# Patient Record
Sex: Female | Born: 1950
Health system: Southern US, Community
[De-identification: ages and names within clinical notes are randomized; demographics above are authoritative.]

## PROBLEM LIST (undated history)

## (undated) DIAGNOSIS — E119 Type 2 diabetes mellitus without complications: Secondary | ICD-10-CM

## (undated) DIAGNOSIS — M1990 Unspecified osteoarthritis, unspecified site: Secondary | ICD-10-CM

## (undated) DIAGNOSIS — T7840XA Allergy, unspecified, initial encounter: Secondary | ICD-10-CM

## (undated) DIAGNOSIS — J45909 Unspecified asthma, uncomplicated: Secondary | ICD-10-CM

## (undated) DIAGNOSIS — E785 Hyperlipidemia, unspecified: Secondary | ICD-10-CM

## (undated) DIAGNOSIS — I10 Essential (primary) hypertension: Secondary | ICD-10-CM

## (undated) DIAGNOSIS — Z9289 Personal history of other medical treatment: Secondary | ICD-10-CM

## (undated) HISTORY — DX: Unspecified asthma, uncomplicated: J45.909

## (undated) HISTORY — DX: Allergy, unspecified, initial encounter: T78.40XA

## (undated) HISTORY — DX: Unspecified osteoarthritis, unspecified site: M19.90

## (undated) HISTORY — DX: Personal history of other medical treatment: Z92.89

## (undated) HISTORY — DX: Type 2 diabetes mellitus without complications: E11.9

## (undated) HISTORY — DX: Essential (primary) hypertension: I10

## (undated) HISTORY — DX: Hyperlipidemia, unspecified: E78.5

## (undated) HISTORY — PX: COLONOSCOPY: SHX174

## (undated) HISTORY — PX: FRACTURE SURGERY: SHX138

## (undated) HISTORY — PX: EYE SURGERY: SHX253

## (undated) HISTORY — PX: BREAST BIOPSY: SHX20

---

## 2010-05-03 ENCOUNTER — Ambulatory Visit (HOSPITAL_COMMUNITY)
Admission: RE | Admit: 2010-05-03 | Discharge: 2010-05-03 | Payer: Self-pay | Source: Home / Self Care | Attending: Family Medicine | Admitting: Family Medicine

## 2011-04-18 ENCOUNTER — Other Ambulatory Visit: Payer: Self-pay | Admitting: Family Medicine

## 2011-04-18 DIAGNOSIS — Z139 Encounter for screening, unspecified: Secondary | ICD-10-CM

## 2011-04-19 HISTORY — PX: OTHER SURGICAL HISTORY: SHX169

## 2011-05-05 ENCOUNTER — Ambulatory Visit (HOSPITAL_COMMUNITY)
Admission: RE | Admit: 2011-05-05 | Discharge: 2011-05-05 | Disposition: A | Discharge status: Home / Self Care | Payer: No Typology Code available for payment source | Source: Ambulatory Visit | Attending: Family Medicine | Admitting: Family Medicine

## 2011-05-05 DIAGNOSIS — Z1231 Encounter for screening mammogram for malignant neoplasm of breast: Secondary | ICD-10-CM | POA: Insufficient documentation

## 2011-05-05 DIAGNOSIS — Z139 Encounter for screening, unspecified: Secondary | ICD-10-CM

## 2011-07-04 ENCOUNTER — Other Ambulatory Visit (HOSPITAL_COMMUNITY)
Admission: RE | Admit: 2011-07-04 | Discharge: 2011-07-04 | Disposition: A | Discharge status: Home / Self Care | Payer: No Typology Code available for payment source | Source: Ambulatory Visit | Attending: Orthopaedic Surgery | Admitting: Orthopaedic Surgery

## 2011-07-04 DIAGNOSIS — M6530 Trigger finger, unspecified finger: Secondary | ICD-10-CM | POA: Insufficient documentation

## 2012-03-26 ENCOUNTER — Other Ambulatory Visit: Payer: Self-pay | Admitting: Family Medicine

## 2012-03-26 DIAGNOSIS — Z139 Encounter for screening, unspecified: Secondary | ICD-10-CM

## 2012-05-08 ENCOUNTER — Ambulatory Visit (HOSPITAL_COMMUNITY)
Admission: RE | Admit: 2012-05-08 | Discharge: 2012-05-08 | Disposition: A | Discharge status: Home / Self Care | Payer: No Typology Code available for payment source | Source: Ambulatory Visit | Attending: Family Medicine | Admitting: Family Medicine

## 2012-05-08 DIAGNOSIS — Z139 Encounter for screening, unspecified: Secondary | ICD-10-CM

## 2012-05-08 DIAGNOSIS — Z1231 Encounter for screening mammogram for malignant neoplasm of breast: Secondary | ICD-10-CM | POA: Insufficient documentation

## 2012-08-22 ENCOUNTER — Ambulatory Visit (INDEPENDENT_AMBULATORY_CARE_PROVIDER_SITE_OTHER): Payer: No Typology Code available for payment source | Admitting: Family Medicine

## 2012-08-22 ENCOUNTER — Encounter: Payer: Self-pay | Admitting: Family Medicine

## 2012-08-22 VITALS — BP 130/80 | HR 60 | Ht 62.0 in | Wt 145.1 lb

## 2012-08-22 DIAGNOSIS — E785 Hyperlipidemia, unspecified: Secondary | ICD-10-CM

## 2012-08-22 DIAGNOSIS — E119 Type 2 diabetes mellitus without complications: Secondary | ICD-10-CM

## 2012-08-22 DIAGNOSIS — I10 Essential (primary) hypertension: Secondary | ICD-10-CM

## 2012-08-22 NOTE — Patient Instructions (Signed)
Call one wk before wellness exam for proper bloodwork

## 2012-08-22 NOTE — Progress Notes (Signed)
  Subjective:    Patient ID: Bianca Arroyo, female    DOB: 06-16-1950, 62 y.o.   MRN: [id number]  Diabetes She presents for her follow-up diabetic visit. She has type 2 diabetes mellitus. Her disease course has been fluctuating. There are no hypoglycemic associated symptoms. Pertinent negatives for diabetes include no blurred vision, no chest pain, no foot ulcerations, no polydipsia and no polyphagia. There are no hypoglycemic complications. Symptoms are stable. There are no diabetic complications. Risk factors for coronary artery disease include dyslipidemia and hypertension. Current diabetic treatment includes oral agent (monotherapy). She is compliant with treatment most of the time. Her weight is increasing steadily. Meal planning includes avoidance of concentrated sweets. She participates in exercise daily. Her home blood glucose trend is fluctuating minimally. Her breakfast blood glucose is taken between 8-9 am. Her breakfast blood glucose range is generally 110-130 mg/dl.    Trying to stay on all meds. dowes nt add sugar to anything.  Review of Systems  Eyes: Negative for blurred vision.  Cardiovascular: Negative for chest pain.  Endocrine: Negative for polydipsia and polyphagia.       Results for orders placed in visit on 08/22/12  POCT GLYCOSYLATED HEMOGLOBIN (HGB A1C)      Result Value Range   Hemoglobin A1C 6.0      Objective:   Physical Exam  Alert no acute distress. HEENT normal. Vitals reviewed. Lungs clear. Heart regular rate and rhythm. Feet pulses good. Sensation intact no edema     Assessment & Plan:  Impression #1 type 2 diabetes good control discussed. #2 hypertension good control discussed. A1c 6.0. Plan diet exercise discussed maintain same meds. Recheck in several months for wellness exam. Encouraged to one week before then to call for blood work. WSL

## 2012-08-23 ENCOUNTER — Encounter: Payer: Self-pay | Admitting: *Deleted

## 2012-08-23 DIAGNOSIS — I10 Essential (primary) hypertension: Secondary | ICD-10-CM | POA: Insufficient documentation

## 2012-08-23 DIAGNOSIS — E785 Hyperlipidemia, unspecified: Secondary | ICD-10-CM | POA: Insufficient documentation

## 2012-08-23 DIAGNOSIS — E119 Type 2 diabetes mellitus without complications: Secondary | ICD-10-CM | POA: Insufficient documentation

## 2012-08-28 ENCOUNTER — Other Ambulatory Visit: Payer: Self-pay | Admitting: Family Medicine

## 2012-09-16 ENCOUNTER — Other Ambulatory Visit: Payer: Self-pay | Admitting: Family Medicine

## 2012-11-20 ENCOUNTER — Other Ambulatory Visit: Payer: Self-pay | Admitting: *Deleted

## 2012-11-20 MED ORDER — VALACYCLOVIR HCL 500 MG PO TABS: 500.0000 mg | ORAL_TABLET | Freq: Two times a day (BID) | ORAL | Status: DC

## 2012-11-26 ENCOUNTER — Other Ambulatory Visit: Payer: Self-pay

## 2012-11-26 MED ORDER — VALACYCLOVIR HCL 500 MG PO TABS: 500.0000 mg | ORAL_TABLET | Freq: Two times a day (BID) | ORAL | Status: DC

## 2012-11-28 ENCOUNTER — Other Ambulatory Visit: Payer: Self-pay | Admitting: Family Medicine

## 2012-12-05 ENCOUNTER — Other Ambulatory Visit: Payer: Self-pay | Admitting: Family Medicine

## 2012-12-05 ENCOUNTER — Encounter: Payer: Self-pay | Admitting: Family Medicine

## 2012-12-05 ENCOUNTER — Ambulatory Visit (INDEPENDENT_AMBULATORY_CARE_PROVIDER_SITE_OTHER): Payer: No Typology Code available for payment source | Admitting: Family Medicine

## 2012-12-05 VITALS — BP 132/78 | HR 70 | Ht 61.5 in | Wt 148.0 lb

## 2012-12-05 DIAGNOSIS — I10 Essential (primary) hypertension: Secondary | ICD-10-CM

## 2012-12-05 DIAGNOSIS — Z0000 Encounter for general adult medical examination without abnormal findings: Secondary | ICD-10-CM

## 2012-12-05 DIAGNOSIS — Z23 Encounter for immunization: Secondary | ICD-10-CM

## 2012-12-05 DIAGNOSIS — Z79899 Other long term (current) drug therapy: Secondary | ICD-10-CM

## 2012-12-05 DIAGNOSIS — Z124 Encounter for screening for malignant neoplasm of cervix: Secondary | ICD-10-CM

## 2012-12-05 DIAGNOSIS — E119 Type 2 diabetes mellitus without complications: Secondary | ICD-10-CM

## 2012-12-05 DIAGNOSIS — E785 Hyperlipidemia, unspecified: Secondary | ICD-10-CM

## 2012-12-05 MED ORDER — OFLOXACIN 0.3 % OP SOLN: OPHTHALMIC | Status: DC

## 2012-12-05 NOTE — Progress Notes (Signed)
  Subjective:    Patient ID: Bianca Arroyo, female    DOB: 1950-11-15, 62 y.o.   MRN: [id number]  HPIHere for a wellness exam. She has been having right ear pain x 2.5 months.  Results for orders placed in visit on 08/22/12  POCT GLYCOSYLATED HEMOGLOBIN (HGB A1C)      Result Value Range   Hemoglobin A1C 6.0     Pt exercising off and on, dancing twice per wk, walk occasionally,   Next colon due 2020  Claims compliance with medication.  Admits to not watching her diet the best.   Review of Systems  Constitutional: Negative for activity change, appetite change and fatigue.  HENT: Negative for congestion, rhinorrhea, neck pain and ear discharge.   Eyes: Negative for discharge.  Respiratory: Negative for cough, chest tightness and wheezing.   Cardiovascular: Negative for chest pain.  Gastrointestinal: Negative for vomiting and abdominal pain.  Genitourinary: Negative for frequency and difficulty urinating.  Allergic/Immunologic: Negative for environmental allergies and food allergies.  Neurological: Negative for weakness and headaches.  Psychiatric/Behavioral: Negative for behavioral problems and agitation.       Objective:   Physical Exam  Constitutional: She is oriented to person, place, and time. She appears well-developed and well-nourished.  HENT:  Head: Normocephalic.  Right Ear: External ear normal.  Left Ear: External ear normal.  Eyes: Pupils are equal, round, and reactive to light.  Neck: Normal range of motion. No thyromegaly present.  Cardiovascular: Normal rate, regular rhythm, normal heart sounds and intact distal pulses.   No murmur heard. Pulmonary/Chest: Effort normal and breath sounds normal. No respiratory distress. She has no wheezes.  Abdominal: Soft. Bowel sounds are normal. She exhibits no distension and no mass. There is no tenderness.  Genitourinary: Vagina normal and uterus normal.  Pap smear obtained  Musculoskeletal: Normal range of motion.  She exhibits no edema and no tenderness.  Lymphadenopathy:    She has no cervical adenopathy.  Neurological: She is alert and oriented to person, place, and time. She exhibits normal muscle tone.  Skin: Skin is warm and dry.  Psychiatric: She has a normal mood and affect. Her behavior is normal.          Assessment & Plan:   impression #1 wellness exam. #2 hyperlipidemia #3 type 2 diabetes #4 hypertension plan pneumonia shot today. Hemoccult cards discussed. Appropriate blood work. Recheck as scheduled. WSL

## 2012-12-06 LAB — PAP IG W/ RFLX HPV ASCU

## 2012-12-07 ENCOUNTER — Other Ambulatory Visit: Payer: Self-pay | Admitting: Family Medicine

## 2012-12-07 LAB — HEPATIC FUNCTION PANEL
ALT: 16 U/L (ref 0–35)
AST: 20 U/L (ref 0–37)
Albumin: 4.2 g/dL (ref 3.5–5.2)

## 2012-12-07 LAB — LIPID PANEL
Cholesterol: 148 mg/dL (ref 0–200)
HDL: 74 mg/dL (ref >39)
Total CHOL/HDL Ratio: 2 Ratio
Triglycerides: 53 mg/dL (ref <150)

## 2012-12-11 ENCOUNTER — Ambulatory Visit (INDEPENDENT_AMBULATORY_CARE_PROVIDER_SITE_OTHER): Payer: No Typology Code available for payment source | Admitting: *Deleted

## 2012-12-11 DIAGNOSIS — H6123 Impacted cerumen, bilateral: Secondary | ICD-10-CM

## 2012-12-11 DIAGNOSIS — H6120 Impacted cerumen, unspecified ear: Secondary | ICD-10-CM

## 2012-12-11 NOTE — Progress Notes (Signed)
Bilateral ear irrigation without difficulty. 

## 2012-12-13 ENCOUNTER — Encounter: Payer: Self-pay | Admitting: Family Medicine

## 2013-02-12 ENCOUNTER — Ambulatory Visit (INDEPENDENT_AMBULATORY_CARE_PROVIDER_SITE_OTHER): Payer: No Typology Code available for payment source | Admitting: *Deleted

## 2013-02-12 DIAGNOSIS — Z23 Encounter for immunization: Secondary | ICD-10-CM

## 2013-02-20 ENCOUNTER — Telehealth: Payer: Self-pay | Admitting: Family Medicine

## 2013-02-20 NOTE — Telephone Encounter (Signed)
Patient has never been diagnosed or treated with gout. Advised patient to schedule office visit to determine diagnose. Transferred to front desk to schedule appointment.

## 2013-02-20 NOTE — Telephone Encounter (Signed)
Patient states her left foot has been hurting for about 3 weeks, however has been worse since Sunday February 17, 2013.  States it is at her big toe the worse.  She believes it is Gout, Wants to know if there is something can phoned in to [facility] in [place].  Please call Patient. Thanks

## 2013-02-25 ENCOUNTER — Ambulatory Visit (INDEPENDENT_AMBULATORY_CARE_PROVIDER_SITE_OTHER): Payer: No Typology Code available for payment source | Admitting: Family Medicine

## 2013-02-25 ENCOUNTER — Encounter: Payer: Self-pay | Admitting: Family Medicine

## 2013-02-25 VITALS — BP 130/80 | Ht 62.0 in | Wt 150.4 lb

## 2013-02-25 DIAGNOSIS — M109 Gout, unspecified: Secondary | ICD-10-CM

## 2013-02-25 MED ORDER — PREDNISONE (PAK) 10 MG PO TABS: ORAL_TABLET | ORAL | Status: DC

## 2013-02-25 NOTE — Progress Notes (Signed)
  Subjective:    Patient ID: Bianca Arroyo, female    DOB: [birth year], 61 y.o.   MRN: [id number]  HPI Patient is here today for a follow up visit on gout. Was seen at urgent care on Wednesday and was diagnosed with gout. Lab work was completed and patient has results with her today. Was told to follow up with her PCP for further workup.   Had an attack of podagra. Pain was severe. They rec blood work. dxed with gout.  Was given colcrys which caused diarrhea and abdominal cramping. Patient notes her foot feels somewhat better with the Relafen. Less pain than before.  There is family history of gallops with a sibling.  Patient also has been on a somewhat high dose of hydrochlorothiazide to 25 mg tablets daily.  Review of Systems No chest pain no back pain no headache no shortness breath no abdominal pain ROS otherwise negative    Objective:   Physical Exam Alert blood pressure good. HEENT normal. Lungs clear. Heart rare rhythm. Left great toe first metatarsal junction significant inflammation redness warmth swelling tenderness       Assessment & Plan:  #1 Blood work revealed a positive ANA. Patient has no history of lupus. No other symptoms. I believe this is a false-positive discussed. #2 gout in the midst of flare somewhat improved #3 hypertension stable. #4 diabetes currently stable. Plan 25 minutes spent most in discussion. Prednisone taper. Followup for diabetes physical. Warning signs discussed.

## 2013-02-25 NOTE — Patient Instructions (Signed)
Decrease the hctz to just one per dayGout Gout is an inflammatory arthritis caused by a buildup of uric acid crystals in the joints. Uric acid is a chemical that is normally present in the blood. When the level of uric acid in the blood is too high it can form crystals that deposit in your joints and tissues. This causes joint redness, soreness, and swelling (inflammation). Repeat attacks are common. Over time, uric acid crystals can form into masses (tophi) near a joint, destroying bone and causing disfigurement. Gout is treatable and often preventable. CAUSES  The disease begins with elevated levels of uric acid in the blood. Uric acid is produced by your body when it breaks down a naturally found substance called purines. Certain foods you eat, such as meats and fish, contain high amounts of purines. Causes of an elevated uric acid level include:  Being passed down from parent to child (heredity).  Diseases that cause increased uric acid production (such as obesity, psoriasis, and certain cancers).  Excessive alcohol use.  Diet, especially diets rich in meat and seafood.  Medicines, including certain cancer-fighting medicines (chemotherapy), water pills (diuretics), and aspirin.  Chronic kidney disease. The kidneys are no longer able to remove uric acid well.  Problems with metabolism. Conditions strongly associated with gout include:  Obesity.  High blood pressure.  High cholesterol.  Diabetes. Not everyone with elevated uric acid levels gets gout. It is not understood why some people get gout and others do not. Surgery, joint injury, and eating too much of certain foods are some of the factors that can lead to gout attacks. SYMPTOMS   An attack of gout comes on quickly. It causes intense pain with redness, swelling, and warmth in a joint.  Fever can occur.  Often, only one joint is involved. Certain joints are more commonly involved:  Base of the big  toe.  Knee.  Ankle.  Wrist.  Finger. Without treatment, an attack usually goes away in a few days to weeks. Between attacks, you usually will not have symptoms, which is different from many other forms of arthritis. DIAGNOSIS  Your caregiver will suspect gout based on your symptoms and exam. In some cases, tests may be recommended. The tests may include:  Blood tests.  Urine tests.  X-rays.  Joint fluid exam. This exam requires a needle to remove fluid from the joint (arthrocentesis). Using a microscope, gout is confirmed when uric acid crystals are seen in the joint fluid. TREATMENT  There are two phases to gout treatment: treating the sudden onset (acute) attack and preventing attacks (prophylaxis).  Treatment of an Acute Attack.  Medicines are used. These include anti-inflammatory medicines or steroid medicines.  An injection of steroid medicine into the affected joint is sometimes necessary.  The painful joint is rested. Movement can worsen the arthritis.  You may use warm or cold treatments on painful joints, depending which works best for you.  Treatment to Prevent Attacks.  If you suffer from frequent gout attacks, your caregiver may advise preventive medicine. These medicines are started after the acute attack subsides. These medicines either help your kidneys eliminate uric acid from your body or decrease your uric acid production. You may need to stay on these medicines for a very long time.  The early phase of treatment with preventive medicine can be associated with an increase in acute gout attacks. For this reason, during the first few months of treatment, your caregiver may also advise you to take medicines usually used  for acute gout treatment. Be sure you understand your caregiver's directions. Your caregiver may make several adjustments to your medicine dose before these medicines are effective.  Discuss dietary treatment with your caregiver or dietitian.  Alcohol and drinks high in sugar and fructose and foods such as meat, poultry, and seafood can increase uric acid levels. Your caregiver or dietician can advise you on drinks and foods that should be limited. HOME CARE INSTRUCTIONS   Do not take aspirin to relieve pain. This raises uric acid levels.  Only take over-the-counter or prescription medicines for pain, discomfort, or fever as directed by your caregiver.  Rest the joint as much as possible. When in bed, keep sheets and blankets off painful areas.  Keep the affected joint raised (elevated).  Apply warm or cold treatments to painful joints. Use of warm or cold treatments depends on which works best for you.  Use crutches if the painful joint is in your leg.  Drink enough fluids to keep your urine clear or pale yellow. This helps your body get rid of uric acid. Limit alcohol, sugary drinks, and fructose drinks.  Follow your dietary instructions. Pay careful attention to the amount of protein you eat. Your daily diet should emphasize fruits, vegetables, whole grains, and fat-free or low-fat milk products. Discuss the use of coffee, vitamin C, and cherries with your caregiver or dietician. These may be helpful in lowering uric acid levels.  Maintain a healthy body weight. SEEK MEDICAL CARE IF:   You develop diarrhea, vomiting, or any side effects from medicines.  You do not feel better in 24 hours, or you are getting worse. SEEK IMMEDIATE MEDICAL CARE IF:   Your joint becomes suddenly more tender, and you have chills or a fever. MAKE SURE YOU:   Understand these instructions.  Will watch your condition.  Will get help right away if you are not doing well or get worse. Document Released: 04/01/2000 Document Revised: 07/30/2012 Document Reviewed: 11/16/2011 [facility] Patient Information 2014 [place], [place].

## 2013-03-27 ENCOUNTER — Ambulatory Visit: Payer: No Typology Code available for payment source | Admitting: Family Medicine

## 2013-03-27 ENCOUNTER — Encounter: Payer: Self-pay | Admitting: Family Medicine

## 2013-03-27 ENCOUNTER — Ambulatory Visit (INDEPENDENT_AMBULATORY_CARE_PROVIDER_SITE_OTHER): Payer: No Typology Code available for payment source | Admitting: Family Medicine

## 2013-03-27 VITALS — BP 150/82 | Ht 62.0 in | Wt 148.5 lb

## 2013-03-27 DIAGNOSIS — E119 Type 2 diabetes mellitus without complications: Secondary | ICD-10-CM

## 2013-03-27 DIAGNOSIS — I10 Essential (primary) hypertension: Secondary | ICD-10-CM

## 2013-03-27 DIAGNOSIS — M109 Gout, unspecified: Secondary | ICD-10-CM

## 2013-03-27 LAB — POCT GLYCOSYLATED HEMOGLOBIN (HGB A1C): Hemoglobin A1C: 6.1

## 2013-03-27 MED ORDER — FEBUXOSTAT 40 MG PO TABS: 40.0000 mg | ORAL_TABLET | Freq: Every day | ORAL | Status: DC

## 2013-03-27 NOTE — Progress Notes (Signed)
   Subjective:    Patient ID: Bianca Arroyo, female    DOB: 07/17/1950, 62 y.o.   MRN: [id number]  Diabetes She presents for her follow-up diabetic visit. She has type 2 diabetes mellitus. Her disease course has been stable. There are no hypoglycemic associated symptoms. There are no diabetic associated symptoms. There are no hypoglycemic complications. Symptoms are stable. There are no diabetic complications. There are no known risk factors for coronary artery disease. Current diabetic treatment includes oral agent (monotherapy). She is compliant with treatment all of the time.    Patient is also here to follow up on gout. Notes that her gout has flared up once again. Unfortunately was almost gone. Now bothering her again. Left great toe. No other concerns at this time.   Compliant with blood pressure medicine. Blood pressure good when checked elsewhere. Tried watch salt intake. Was exercising regularly until she developed the gallops.   Glucoses generally pretty good, overall 120 ish. After eating goes up  BP usually good, generally  Results for orders placed in visit on 03/27/13  POCT GLYCOSYLATED HEMOGLOBIN (HGB A1C)      Result Value Range   Hemoglobin A1C 6.1       Review of Systems No chest pain no headache no back pain no shortness breath no abdominal pain ROS otherwise negative    Objective:   Physical Exam  Word H&T normal. Lungs clear heart regular in rhythm. Abdomen benign. Left great toe inflamed swollen tender.  See diabetic foot exam      Assessment & Plan:  Impression #1 persistent gout discussed at length. Start time to start on preventive agent discussed. #2 hypertension good control generally. #3 type 2 diabetes very good control. Plan diet exercise discussed. Add ULORIC1 tablet daily. Diet discussed. Maintain same medications.

## 2013-03-27 NOTE — Patient Instructions (Signed)
Would prefer to hold off on steroids if possible,  So, either take the generic relafen twice per day, or otc ibuprofen three tabs three times per day (600 mg total)

## 2013-03-29 ENCOUNTER — Telehealth: Payer: Self-pay | Admitting: Family Medicine

## 2013-03-29 NOTE — Telephone Encounter (Signed)
Pt seen Dr. Brett Canales Wednesday, was given Uloric for gout, is too expensive, is there a cheaper generic that can help?? Please advise [facility]

## 2013-03-29 NOTE — Telephone Encounter (Signed)
Dr Brett Canales will need to handle on Monday, notify pt and route to him please

## 2013-03-31 NOTE — Telephone Encounter (Signed)
Do not take it then, needs to sched a visit to discuss further other preventive meds (one also very costly and the other may cause renal failure and dialysis)

## 2013-04-01 NOTE — Telephone Encounter (Signed)
Notified patient do not take it then, needs to sched a visit to discuss further other preventive meds (one also very costly and the other may cause renal failure and dialysis. Patient transferred to front desk to schedule appointment.

## 2013-04-03 ENCOUNTER — Ambulatory Visit (INDEPENDENT_AMBULATORY_CARE_PROVIDER_SITE_OTHER): Payer: No Typology Code available for payment source | Admitting: Family Medicine

## 2013-04-03 ENCOUNTER — Encounter: Payer: Self-pay | Admitting: Family Medicine

## 2013-04-03 VITALS — BP 128/82 | Ht 62.0 in | Wt 145.8 lb

## 2013-04-03 DIAGNOSIS — M109 Gout, unspecified: Secondary | ICD-10-CM

## 2013-04-03 NOTE — Progress Notes (Signed)
   Subjective:    Patient ID: Bianca Arroyo, female    DOB: [birth year], 62 y.o.   MRN: [id number]  HPI Patient arrives to discuss flare of gout in left foot for few months. Uloric is expensive and patient would like to discuss other treatment options. Patient is allergic to Colcrys. On further history patient more has GI symptoms from Colcrys  Cost for the uloric is very high,greater than 120  Would perk prefer to have something inexpensive and generic.  Still having difficulties with gout.         Review of Systems No headache no chest pain no abdominal pain no back pain ROS otherwise negative    Objective:   Physical Exam Residual inflammation the left great toe. Pulses good sensation intact lungs clear. Heart rate rhythm.       Assessment & Plan:  Impression gout long discussion held. Allopurinol increase his risk of dialysis. Patient does not want this. Cannot handle colchicine secondary to GI side effects. Plan on further consideration patient would like to stay on the [facility]

## 2013-04-15 ENCOUNTER — Other Ambulatory Visit: Payer: Self-pay | Admitting: Family Medicine

## 2013-04-15 DIAGNOSIS — Z139 Encounter for screening, unspecified: Secondary | ICD-10-CM

## 2013-04-17 ENCOUNTER — Telehealth: Payer: Self-pay | Admitting: Family Medicine

## 2013-04-17 NOTE — Telephone Encounter (Signed)
Prior auth obtained for pt's ULORIC 40mg , auth expires 03/18/14, notified pt and faxed approval to [facility]

## 2013-05-10 ENCOUNTER — Ambulatory Visit (HOSPITAL_COMMUNITY): Payer: No Typology Code available for payment source

## 2013-05-14 ENCOUNTER — Ambulatory Visit (HOSPITAL_COMMUNITY)
Admission: RE | Admit: 2013-05-14 | Discharge: 2013-05-14 | Disposition: A | Discharge status: Home / Self Care | Payer: No Typology Code available for payment source | Source: Ambulatory Visit | Attending: Family Medicine | Admitting: Family Medicine

## 2013-05-14 DIAGNOSIS — Z139 Encounter for screening, unspecified: Secondary | ICD-10-CM

## 2013-05-14 DIAGNOSIS — Z1231 Encounter for screening mammogram for malignant neoplasm of breast: Secondary | ICD-10-CM | POA: Insufficient documentation

## 2013-05-27 ENCOUNTER — Other Ambulatory Visit: Payer: Self-pay | Admitting: *Deleted

## 2013-05-27 MED ORDER — GLIPIZIDE 5 MG PO TABS: 5.0000 mg | ORAL_TABLET | Freq: Every day | ORAL | Status: DC

## 2013-06-14 ENCOUNTER — Other Ambulatory Visit: Payer: Self-pay | Admitting: *Deleted

## 2013-06-14 MED ORDER — LOVASTATIN 20 MG PO TABS: ORAL_TABLET | ORAL | Status: DC

## 2013-08-27 ENCOUNTER — Other Ambulatory Visit: Payer: Self-pay | Admitting: Family Medicine

## 2013-09-16 ENCOUNTER — Other Ambulatory Visit: Payer: Self-pay | Admitting: Family Medicine

## 2013-11-19 ENCOUNTER — Encounter: Payer: Self-pay | Admitting: Family Medicine

## 2013-11-19 ENCOUNTER — Ambulatory Visit (INDEPENDENT_AMBULATORY_CARE_PROVIDER_SITE_OTHER): Payer: No Typology Code available for payment source | Admitting: Family Medicine

## 2013-11-19 VITALS — BP 134/82 | Ht 62.0 in | Wt 145.0 lb

## 2013-11-19 DIAGNOSIS — E119 Type 2 diabetes mellitus without complications: Secondary | ICD-10-CM

## 2013-11-19 DIAGNOSIS — I10 Essential (primary) hypertension: Secondary | ICD-10-CM

## 2013-11-19 DIAGNOSIS — M109 Gout, unspecified: Secondary | ICD-10-CM

## 2013-11-19 DIAGNOSIS — Z79899 Other long term (current) drug therapy: Secondary | ICD-10-CM

## 2013-11-19 DIAGNOSIS — D539 Nutritional anemia, unspecified: Secondary | ICD-10-CM

## 2013-11-19 DIAGNOSIS — E785 Hyperlipidemia, unspecified: Secondary | ICD-10-CM

## 2013-11-19 LAB — POCT GLYCOSYLATED HEMOGLOBIN (HGB A1C): HEMOGLOBIN A1C: 6.2

## 2013-11-19 MED ORDER — NABUMETONE 750 MG PO TABS: 750.0000 mg | ORAL_TABLET | Freq: Two times a day (BID) | ORAL | Status: DC | PRN

## 2013-11-19 NOTE — Progress Notes (Signed)
   Subjective:    Patient ID: Bianca Arroyo, female    DOB: 1951/03/09, 63 y.o.   MRN: [id number] Patient arrives to the office today remarkably was 6 or 7 problems.   Diabetes She presents for her follow-up diabetic visit. She has type 2 diabetes mellitus. She is compliant with treatment all of the time. She is following a generally healthy diet. She participates in exercise three times a week. Her breakfast blood glucose range is generally 110-130 mg/dl. She sees a podiatrist.Eye exam is current.  A1C today 6.2   Swollen knot on back of right side of neck. Noticed this about a month ago. Decided to wait until today to have it evaluated.  Painful knot on left elbow. Notices several months ago. Decided to wait until today to have it evaluated. Tender sore worse with certain motions. Often a deep ache sometimes radiating to the wrist.   Needs refill on relafen.    Pt requesting microalb urine test.    Requesting colonoscopy. Colonoscopy sheet given.   Patient saw a podiatrist multiple times with an apparent working diagnosis of potential neuropathy. Patient claims that in the and they stated they had no idea why she had difficulty with her feet. She had some burning and tingling primarily in her left foot. She states that this is now gone away.  Patient claims compliance with lipid medicine. She did not get blood work as were requested. She brings in screening blood tests which are insufficient for her situation.  Patient claims compliance with her blood pressure medicine. No obvious side effects. Watching her salt intake.  Patient claims compliance with her lipid medication. Once again no obvious side effects no compliance with liver testing is requested.  Review of Systems No headache no chest pain no shortness of breath no back pain no change bowel habits no blood in stool no weight loss no weight gain    Objective:   Physical Exam  Alert no apparent distress HEENT normal.  Lungs clear. Heart regular in rhythm. Ankles without edema. Sensation currently intact. Left lateral elbow very tender to palpation. Right posterior neck normal lymph node. Ankles without edema.      Assessment & Plan:  Pressure 1 hypertension stable #2 hyperlipidemia status uncertain #3 type 2 diabetes good control #4 gout no recurrence #5 lateral epicondylitis discussed #6 posterior palpable lymph node within normal limits discussed #7 question neuropathy symptoms calm now. #8 chronic arthritis pain. Plan appropriate blood work. Diet exercise discussed. Meds refilled. Easily 40 minutes spent covering this patient's 8 problems. Followup in one month for discussion of blood work results. WSL

## 2013-11-20 ENCOUNTER — Telehealth: Payer: Self-pay

## 2013-11-20 NOTE — Telephone Encounter (Signed)
Pt called to be set up for her TCS per PCP. [phone]

## 2013-11-22 ENCOUNTER — Encounter (HOSPITAL_COMMUNITY): Payer: Self-pay

## 2013-11-22 ENCOUNTER — Other Ambulatory Visit: Payer: Self-pay

## 2013-11-22 DIAGNOSIS — Z1211 Encounter for screening for malignant neoplasm of colon: Secondary | ICD-10-CM

## 2013-11-22 NOTE — Telephone Encounter (Signed)
Gastroenterology Pre-Procedure Review     Request Date: 11/22/2013 Requesting Physician: Dr. Mickie Hillier  PT SAID SHE HAD HER FIRST COLONOSCOPY ABOUT 10 YEARS AGO IN [place] AND IT WAS  NORMAL  PATIENT REVIEW QUESTIONS: The patient responded to the following health history questions as indicated:    1. Diabetes Melitis:yes  2. Joint replacements in the past 12 months: no 3. Major health problems in the past 3 months: no 4. Has an artificial valve or MVP: no 5. Has a defibrillator: no 6. Has been advised in past to take antibiotics in advance of a procedure like teeth cleaning: no    MEDICATIONS & ALLERGIES:    Patient reports the following regarding taking any blood thinners:   Plavix? no Aspirin? yes Coumadin? no  Patient confirms/reports the following medications:  Current Outpatient Prescriptions  Medication Sig Dispense Refill  . febuxostat (ULORIC) 40 MG tablet Take 1 tablet (40 mg total) by mouth daily.  30 tablet  11  . glipiZIDE (GLUCOTROL) 5 MG tablet TAKE 1 TABLET DAILY BEFORE BREAKFAST PLEASE MAKE AN   APPOINTMENT WITH YOUR      DOCTOR  90 tablet  0  . hydrochlorothiazide (HYDRODIURIL) 25 MG tablet Take by mouth. Take 2 tablets daily      . lisinopril (PRINIVIL,ZESTRIL) 40 MG tablet Take 40 mg by mouth 2 (two) times daily.      Marland Kitchen lovastatin (MEVACOR) 20 MG tablet TAKE 1 TABLET DAILY  90 tablet  0  . metoprolol succinate (TOPROL-XL) 50 MG 24 hr tablet TAKE 1 TABLET DAILY  90 tablet  0  . nabumetone (RELAFEN) 750 MG tablet Take 1 tablet (750 mg total) by mouth 2 (two) times daily as needed.  60 tablet  0  . valACYclovir (VALTREX) 500 MG tablet Take 1 tablet (500 mg total) by mouth 2 (two) times daily. For 3 days as needed for flares  18 tablet  6   No current facility-administered medications for this visit.    Patient confirms/reports the following allergies:  Allergies  Allergen Reactions  . Colcrys [Colchicine]     GI trouble    No orders of the defined  types were placed in this encounter.    AUTHORIZATION INFORMATION Primary Insurance:   ID #:  Group #:  Pre-Cert / Auth required: Pre-Cert / Auth #:   Secondary Insurance:   ID #:   Group #:  Pre-Cert / Auth required:  Pre-Cert / Auth #:   SCHEDULE INFORMATION: Procedure has been scheduled as follows:  Date:  12/06/2013              Time:  9:30 AM Location: [facility] Short Stay  This Gastroenterology Pre-Precedure Review Form is being routed to the following provider(s): Barney Drain, MD

## 2013-11-23 LAB — HEPATIC FUNCTION PANEL
ALBUMIN: 4.2 g/dL (ref 3.5–5.2)
ALK PHOS: 78 U/L (ref 39–117)
ALT: 19 U/L (ref 0–35)
AST: 19 U/L (ref 0–37)
BILIRUBIN TOTAL: 0.6 mg/dL (ref 0.2–1.2)
Bilirubin, Direct: 0.1 mg/dL (ref 0.0–0.3)
Indirect Bilirubin: 0.5 mg/dL (ref 0.2–1.2)
Total Protein: 7.2 g/dL (ref 6.0–8.3)

## 2013-11-23 LAB — BASIC METABOLIC PANEL
BUN: 16 mg/dL (ref 6–23)
CALCIUM: 9.5 mg/dL (ref 8.4–10.5)
CO2: 31 mEq/L (ref 19–32)
Chloride: 100 mEq/L (ref 96–112)
Creat: 0.85 mg/dL (ref 0.50–1.10)
Glucose, Bld: 132 mg/dL — ABNORMAL HIGH (ref 70–99)
Potassium: 3.6 mEq/L (ref 3.5–5.3)
Sodium: 141 mEq/L (ref 135–145)

## 2013-11-23 LAB — LIPID PANEL
CHOL/HDL RATIO: 1.9 ratio
CHOLESTEROL: 155 mg/dL (ref 0–200)
HDL: 80 mg/dL (ref >39)
LDL Cholesterol: 62 mg/dL (ref 0–99)
Triglycerides: 67 mg/dL (ref <150)
VLDL: 13 mg/dL (ref 0–40)

## 2013-11-23 LAB — FOLATE

## 2013-11-23 LAB — MICROALBUMIN, URINE: MICROALB UR: 1.49 mg/dL (ref 0.00–1.89)

## 2013-11-24 ENCOUNTER — Other Ambulatory Visit: Payer: Self-pay | Admitting: Family Medicine

## 2013-11-25 MED ORDER — PEG-KCL-NACL-NASULF-NA ASC-C 100 G PO SOLR: 1.0000 | ORAL | Status: DC

## 2013-11-25 NOTE — Telephone Encounter (Signed)
MOVI PREP SPLIT DOSING-FULL LIQUIDS WITH BREAKFAST.  HOLD HCTZ ON DAY BEFORE AND DAY OF TCS.  HOLD GLIPIZIDE ON AM OF TCS.

## 2013-11-25 NOTE — Telephone Encounter (Signed)
Rx sent to the pharmacy and instructions mailed to pt.  

## 2013-11-25 NOTE — Addendum Note (Signed)
Addended by: Everardo All on: 11/25/2013 03:00 PM   Modules accepted: Orders

## 2013-11-26 ENCOUNTER — Telehealth: Payer: Self-pay

## 2013-11-26 NOTE — Telephone Encounter (Signed)
Fax received from [employer], academic) NO PA REQUIRED FOR SCREENING COLONOSCOPY.

## 2013-12-06 ENCOUNTER — Encounter (HOSPITAL_COMMUNITY)
Admission: RE | Disposition: A | Discharge status: Home / Self Care | Payer: Self-pay | Source: Ambulatory Visit | Attending: Gastroenterology

## 2013-12-06 ENCOUNTER — Encounter: Payer: Self-pay | Admitting: Family Medicine

## 2013-12-06 ENCOUNTER — Ambulatory Visit (HOSPITAL_COMMUNITY)
Admission: RE | Admit: 2013-12-06 | Discharge: 2013-12-06 | Disposition: A | Discharge status: Home / Self Care | Payer: No Typology Code available for payment source | Source: Ambulatory Visit | Attending: Gastroenterology | Admitting: Gastroenterology

## 2013-12-06 ENCOUNTER — Encounter (HOSPITAL_COMMUNITY): Payer: Self-pay | Admitting: *Deleted

## 2013-12-06 DIAGNOSIS — E785 Hyperlipidemia, unspecified: Secondary | ICD-10-CM | POA: Insufficient documentation

## 2013-12-06 DIAGNOSIS — I10 Essential (primary) hypertension: Secondary | ICD-10-CM | POA: Insufficient documentation

## 2013-12-06 DIAGNOSIS — K5730 Diverticulosis of large intestine without perforation or abscess without bleeding: Secondary | ICD-10-CM | POA: Diagnosis not present

## 2013-12-06 DIAGNOSIS — Q438 Other specified congenital malformations of intestine: Secondary | ICD-10-CM | POA: Insufficient documentation

## 2013-12-06 DIAGNOSIS — Z1211 Encounter for screening for malignant neoplasm of colon: Secondary | ICD-10-CM

## 2013-12-06 DIAGNOSIS — Z79899 Other long term (current) drug therapy: Secondary | ICD-10-CM | POA: Diagnosis not present

## 2013-12-06 DIAGNOSIS — K648 Other hemorrhoids: Secondary | ICD-10-CM | POA: Diagnosis not present

## 2013-12-06 DIAGNOSIS — K621 Rectal polyp: Secondary | ICD-10-CM

## 2013-12-06 DIAGNOSIS — D126 Benign neoplasm of colon, unspecified: Secondary | ICD-10-CM | POA: Diagnosis not present

## 2013-12-06 DIAGNOSIS — K620 Anal polyp: Secondary | ICD-10-CM | POA: Insufficient documentation

## 2013-12-06 DIAGNOSIS — E119 Type 2 diabetes mellitus without complications: Secondary | ICD-10-CM | POA: Insufficient documentation

## 2013-12-06 HISTORY — PX: COLONOSCOPY: SHX5424

## 2013-12-06 LAB — GLUCOSE, CAPILLARY: Glucose-Capillary: 148 mg/dL — ABNORMAL HIGH (ref 70–99)

## 2013-12-06 SURGERY — COLONOSCOPY
Anesthesia: Moderate Sedation

## 2013-12-06 MED ORDER — MIDAZOLAM HCL 5 MG/5ML IJ SOLN
INTRAMUSCULAR | Status: DC | PRN
  Administered 2013-12-06: 2 mg via INTRAVENOUS
  Administered 2013-12-06: 1 mg via INTRAVENOUS
  Administered 2013-12-06: 2 mg via INTRAVENOUS

## 2013-12-06 MED ORDER — MEPERIDINE HCL 100 MG/ML IJ SOLN
INTRAMUSCULAR | Status: DC | PRN
  Administered 2013-12-06 (×2): 25 mg via INTRAVENOUS

## 2013-12-06 MED ORDER — SODIUM CHLORIDE 0.9 % IV SOLN
INTRAVENOUS | Status: DC
  Administered 2013-12-06: 09:00:00 via INTRAVENOUS

## 2013-12-06 MED ORDER — MEPERIDINE HCL 100 MG/ML IJ SOLN
INTRAMUSCULAR | Status: AC
  Filled 2013-12-06: qty 2

## 2013-12-06 MED ORDER — MIDAZOLAM HCL 5 MG/5ML IJ SOLN
INTRAMUSCULAR | Status: AC
  Filled 2013-12-06: qty 10

## 2013-12-06 MED ORDER — SIMETHICONE 40 MG/0.6ML PO SUSP
ORAL | Status: DC | PRN
  Administered 2013-12-06: 09:00:00

## 2013-12-06 NOTE — Op Note (Signed)
[facility] [address] [place], [address]   COLONOSCOPY PROCEDURE REPORT  PATIENT: Arroyo, Bianca  MR#: [id number] BIRTHDATE: 06/18/1950 , 63  yrs. old GENDER: Female ENDOSCOPIST: Barney Drain, MD REFERRED TM:[id number] Wolfgang Phoenix, M.D. PROCEDURE DATE:  12/06/2013 PROCEDURE:   Colonoscopy/ENDOCUFF with snare AND cold biopsy polypectomy INDICATIONS:Average risk patient for colon cancer. MEDICATIONS: Demerol 50 mg IV and Versed 5 mg IV  DESCRIPTION OF PROCEDURE:    Physical exam was performed.  Informed consent was obtained from the patient after explaining the benefits, risks, and alternatives to procedure.  The patient was connected to monitor and placed in left lateral position. Continuous oxygen was provided by nasal cannula and IV medicine administered through an indwelling cannula.  After administration of sedation and rectal exam, the patients rectum was intubated and the EC-3890Li ([id number])  colonoscope was advanced under direct visualization to the cecum.  The scope was removed slowly by carefully examining the color, texture, anatomy, and integrity mucosa on the way out.  The patient was recovered in endoscopy and discharged home in satisfactory condition.    COLON FINDINGS: A sessile polyp measuring 6 mm in size was found in the ascending colon.  A polypectomy was performed using snare cautery.  , A sessile polyp measuring 3 mm in size was found in the rectum.  A polypectomy was performed with cold forceps.  , There was moderate diverticulosis noted in the ascending colon with associated muscular hypertrophy.  , Small internal hemorrhoids were found.  , and The LEFT COLON IS redundant.  Manual abdominal counter-pressure was used to reach the cecum.  PREP QUALITY: good.  CECAL W/D TIME: 13 minutes     COMPLICATIONS: None  ENDOSCOPIC IMPRESSION: 1.   TWO COLON POLYPS REMOVED 2.   Moderate diverticulosis in the ascending colon 4.   Small internal  hemorrhoids 5.   The LEFT COLON IS redundant   RECOMMENDATIONS: FOLLOW A HIGH FIBER DIET.  AVOID ITEMS THAT CAUSE BLOATING. BIOPSY RESULTS SHOULD BE BACK IN 14 DAYS.  Next colonoscopy in 5-10 years.  Consider an overtube.       _______________________________ eSignedBarney Drain, MD 12/06/2013 3:51 PM

## 2013-12-06 NOTE — Discharge Instructions (Signed)
You had 2 polyps removed. You have internal hemorrhoids and diverticulosis IN YOUR RIGHT COLON.   FOLLOW A HIGH FIBER DIET. AVOID ITEMS THAT CAUSE BLOATING. SEE INFO BELOW.  YOUR BIOPSY RESULTS SHOULD BE BACK IN 14 DAYS.  Next colonoscopy in 5-10 years.   Colonoscopy Care After Read the instructions outlined below and refer to this sheet in the next week. These discharge instructions provide you with general information on caring for yourself after you leave the hospital. While your treatment has been planned according to the most current medical practices available, unavoidable complications occasionally occur. If you have any problems or questions after discharge, call DR. Kisean Rollo, [phone].  ACTIVITY  You may resume your regular activity, but move at a slower pace for the next 24 hours.   Take frequent rest periods for the next 24 hours.   Walking will help get rid of the air and reduce the bloated feeling in your belly (abdomen).   No driving for 24 hours (because of the medicine (anesthesia) used during the test).   You may shower.   Do not sign any important legal documents or operate any machinery for 24 hours (because of the anesthesia used during the test).    NUTRITION  Drink plenty of fluids.   You may resume your normal diet as instructed by your doctor.   Begin with a light meal and progress to your normal diet. Heavy or fried foods are harder to digest and may make you feel sick to your stomach (nauseated).   Avoid alcoholic beverages for 24 hours or as instructed.    MEDICATIONS  You may resume your normal medications.   WHAT YOU CAN EXPECT TODAY  Some feelings of bloating in the abdomen.   Passage of more gas than usual.   Spotting of blood in your stool or on the toilet paper  .  IF YOU HAD POLYPS REMOVED DURING THE COLONOSCOPY:  Eat a soft diet IF YOU HAVE NAUSEA, BLOATING, ABDOMINAL PAIN, OR VOMITING.    FINDING OUT THE RESULTS OF YOUR  TEST Not all test results are available during your visit. DR. Oneida Alar WILL CALL YOU WITHIN 7 DAYS OF YOUR PROCEDUE WITH YOUR RESULTS. Do not assume everything is normal if you have not heard from DR. Eloy Fehl IN ONE WEEK, CALL HER OFFICE AT [phone].  SEEK IMMEDIATE MEDICAL ATTENTION AND CALL THE OFFICE: [phone] IF:  You have more than a spotting of blood in your stool.   Your belly is swollen (abdominal distention).   You are nauseated or vomiting.   You have a temperature over 101F.   You have abdominal pain or discomfort that is severe or gets worse throughout the day.  Polyps, Colon  A polyp is extra tissue that grows inside your body. Colon polyps grow in the large intestine. The large intestine, also called the colon, is part of your digestive system. It is a long, hollow tube at the end of your digestive tract where your body makes and stores stool. Most polyps are not dangerous. They are benign. This means they are not cancerous. But over time, some types of polyps can turn into cancer. Polyps that are smaller than a pea are usually not harmful. But larger polyps could someday become or may already be cancerous. To be safe, doctors remove all polyps and test them.   WHO GETS POLYPS? Anyone can get polyps, but certain people are more likely than others. You may have a greater chance of getting polyps  if:  You are over 50.   You have had polyps before.   Someone in your family has had polyps.   Someone in your family has had cancer of the large intestine.   Find out if someone in your family has had polyps. You may also be more likely to get polyps if you:   Eat a lot of fatty foods   Smoke   Drink alcohol   Do not exercise  Eat too much   TREATMENT  The caregiver will remove the polyp during sigmoidoscopy or colonoscopy.  PREVENTION There is not one sure way to prevent polyps. You might be able to lower your risk of getting them if you:  Eat more fruits and  vegetables and less fatty food.   Do not smoke.   Avoid alcohol.   Exercise every day.   Lose weight if you are overweight.   Eating more calcium and folate can also lower your risk of getting polyps. Some foods that are rich in calcium are milk, cheese, and broccoli. Some foods that are rich in folate are chickpeas, kidney beans, and spinach.   High-Fiber Diet A high-fiber diet changes your normal diet to include more whole grains, legumes, fruits, and vegetables. Changes in the diet involve replacing refined carbohydrates with unrefined foods. The calorie level of the diet is essentially unchanged. The Dietary Reference Intake (recommended amount) for adult males is 38 grams per day. For adult females, it is 25 grams per day. Pregnant and lactating women should consume 28 grams of fiber per day. Fiber is the intact part of a plant that is not broken down during digestion. Functional fiber is fiber that has been isolated from the plant to provide a beneficial effect in the body. PURPOSE  Increase stool bulk.   Ease and regulate bowel movements.   Lower cholesterol.  INDICATIONS THAT YOU NEED MORE FIBER  Constipation and hemorrhoids.   Uncomplicated diverticulosis (intestine condition) and irritable bowel syndrome.   Weight management.   As a protective measure against hardening of the arteries (atherosclerosis), diabetes, and cancer.   GUIDELINES FOR INCREASING FIBER IN THE DIET  Start adding fiber to the diet slowly. A gradual increase of about 5 more grams (2 slices of whole-wheat bread, 2 servings of most fruits or vegetables, or 1 bowl of high-fiber cereal) per day is best. Too rapid an increase in fiber may result in constipation, flatulence, and bloating.   Drink enough water and fluids to keep your urine clear or pale yellow. Water, juice, or caffeine-free drinks are recommended. Not drinking enough fluid may cause constipation.   Eat a variety of high-fiber foods rather  than one type of fiber.   Try to increase your intake of fiber through using high-fiber foods rather than fiber pills or supplements that contain small amounts of fiber.   The goal is to change the types of food eaten. Do not supplement your present diet with high-fiber foods, but replace foods in your present diet.  INCLUDE A VARIETY OF FIBER SOURCES  Replace refined and processed grains with whole grains, canned fruits with fresh fruits, and incorporate other fiber sources. White rice, white breads, and most bakery goods contain little or no fiber.   Brown whole-grain rice, buckwheat oats, and many fruits and vegetables are all good sources of fiber. These include: broccoli, Brussels sprouts, cabbage, cauliflower, beets, sweet potatoes, white potatoes (skin on), carrots, tomatoes, eggplant, squash, berries, fresh fruits, and dried fruits.   Cereals appear  to be the richest source of fiber. Cereal fiber is found in whole grains and bran. Bran is the fiber-rich outer coat of cereal grain, which is largely removed in refining. In whole-grain cereals, the bran remains. In breakfast cereals, the largest amount of fiber is found in those with "bran" in their names. The fiber content is sometimes indicated on the label.   You may need to include additional fruits and vegetables each day.   In baking, for 1 cup white flour, you may use the following substitutions:   1 cup whole-wheat flour minus 2 tablespoons.   1/2 cup white flour plus 1/2 cup whole-wheat flour.   Diverticulosis Diverticulosis is a common condition that develops when small pouches (diverticula) form in the wall of the colon. The risk of diverticulosis increases with age. It happens more often in people who eat a low-fiber diet. Most individuals with diverticulosis have no symptoms. Those individuals with symptoms usually experience belly (abdominal) pain, constipation, or loose stools (diarrhea).  HOME CARE  INSTRUCTIONS  Increase the amount of fiber in your diet as directed by your caregiver or dietician. This may reduce symptoms of diverticulosis.   Drink at least 6 to 8 glasses of water each day to prevent constipation.   Try not to strain when you have a bowel movement.   Avoiding nuts and seeds to prevent complications is still an uncertain benefit.       FOODS HAVING HIGH FIBER CONTENT INCLUDE:  Fruits. Apple, peach, pear, tangerine, raisins, prunes.   Vegetables. Brussels sprouts, asparagus, broccoli, cabbage, carrot, cauliflower, romaine lettuce, spinach, summer squash, tomato, winter squash, zucchini.   Starchy Vegetables. Baked beans, kidney beans, lima beans, split peas, lentils, potatoes (with skin).   Grains. Whole wheat bread, brown rice, bran flake cereal, plain oatmeal, white rice, shredded wheat, bran muffins.    SEEK IMMEDIATE MEDICAL CARE IF:  You develop increasing pain or severe bloating.   You have an oral temperature above 101F.   You develop vomiting or bowel movements that are bloody or black.   Hemorrhoids Hemorrhoids are dilated (enlarged) veins around the rectum. Sometimes clots will form in the veins. This makes them swollen and painful. These are called thrombosed hemorrhoids. Causes of hemorrhoids include:  Constipation.   Straining to have a bowel movement.   HEAVY LIFTING HOME CARE INSTRUCTIONS  Eat a well balanced diet and drink 6 to 8 glasses of water every day to avoid constipation. You may also use a bulk laxative.   Avoid straining to have bowel movements.   Keep anal area dry and clean.   Do not use a donut shaped pillow or sit on the toilet for long periods. This increases blood pooling and pain.   Move your bowels when your body has the urge; this will require less straining and will decrease pain and pressure.

## 2013-12-06 NOTE — H&P (Signed)
Primary Care Physician:  Rubbie Battiest, MD Primary Gastroenterologist:  Dr. Oneida Alar  Pre-Procedure History & Physical: HPI:  Bianca Arroyo is a 63 y.o. female here for [place].  Past Medical History  Diagnosis Date  . Diabetes mellitus without complication   . Hypertension   . Hyperlipidemia     Past Surgical History  Procedure Laterality Date  . Cesarean section    . Colonoscopy    . Left wrist surgery    . Right trigger thumb  2013    Prior to Admission medications   Medication Sig Start Date End Date Taking? Authorizing Provider  aspirin 81 MG chewable tablet Chew 81 mg by mouth daily.   Yes Historical Provider, MD  febuxostat (ULORIC) 40 MG tablet Take 1 tablet (40 mg total) by mouth daily. 03/27/13  Yes Mikey Kirschner, MD  glipiZIDE (GLUCOTROL) 5 MG tablet TAKE 1 TABLET DAILY BEFORE BREAKFAST PLEASE MAKE AN   APPOINTMENT WITH YOUR      DOCTOR 09/16/13  Yes Mikey Kirschner, MD  hydrochlorothiazide (HYDRODIURIL) 25 MG tablet Take 25 mg by mouth 2 (two) times daily.    Yes Historical Provider, MD  lisinopril (PRINIVIL,ZESTRIL) 40 MG tablet Take 40 mg by mouth 2 (two) times daily.   Yes Historical Provider, MD  metoprolol succinate (TOPROL-XL) 50 MG 24 hr tablet TAKE 1 TABLET DAILY 09/16/13  Yes Mikey Kirschner, MD  Multiple Vitamins-Minerals (ALIVE ONCE DAILY WOMENS 50+ PO) Take 1 tablet by mouth daily.   Yes Historical Provider, MD  peg 3350 powder (MOVIPREP) 100 G SOLR Take 1 kit (200 g total) by mouth as directed. 11/25/13  Yes Danie Binder, MD  Tetrahydrozoline HCl (VISINE OP) Apply 2 drops to eye daily as needed (dry eyes).   Yes Historical Provider, MD  lovastatin (MEVACOR) 20 MG tablet TAKE 1 TABLET DAILY    Mikey Kirschner, MD  nabumetone (RELAFEN) 750 MG tablet Take 750 mg by mouth 2 (two) times daily. Gout/ pain    Historical Provider, MD  valACYclovir (VALTREX) 500 MG tablet Take 1 tablet (500 mg total) by mouth 2 (two) times daily. For 3 days as  needed for flares 11/26/12   Mikey Kirschner, MD    Allergies as of 11/22/2013 - Review Complete 11/22/2013  Allergen Reaction Noted  . Colcrys [colchicine]  02/25/2013    Family History  Problem Relation Age of Onset  . Colon cancer Neg Hx     History   Social History  . Marital Status: Divorced    Spouse Name: N/A    Number of Children: N/A  . Years of Education: N/A   Occupational History  . Not on file.   Social History Main Topics  . Smoking status: Never Smoker   . Smokeless tobacco: Not on file  . Alcohol Use: 1.5 oz/week    3 drink(s) per week  . Drug Use: No  . Sexual Activity: Not on file   Other Topics Concern  . Not on file   Social History Narrative  . No narrative on file    Review of Systems: See HPI, otherwise negative ROS   Physical Exam: BP 168/71  Pulse 60  Temp(Src) 98.6 F (37 C) (Oral)  Resp 18  Ht 5' 2"  (1.575 m)  Wt 145 lb (65.772 kg)  BMI 26.51 kg/m2  SpO2 98% General:   Alert,  pleasant and cooperative in NAD Head:  Normocephalic and atraumatic. Neck:  Supple; Lungs:  Clear throughout  to auscultation.    Heart:  Regular rate and rhythm. Abdomen:  Soft, nontender and nondistended. Normal bowel sounds, without guarding, and without rebound.   Neurologic:  Alert and  oriented x4;  grossly normal neurologically.  Impression/Plan:     SCREENING  Plan:  1. TCS TODAY

## 2013-12-11 ENCOUNTER — Encounter (HOSPITAL_COMMUNITY): Payer: Self-pay | Admitting: Gastroenterology

## 2013-12-17 ENCOUNTER — Encounter: Payer: Self-pay | Admitting: Family Medicine

## 2013-12-17 ENCOUNTER — Ambulatory Visit (INDEPENDENT_AMBULATORY_CARE_PROVIDER_SITE_OTHER): Payer: No Typology Code available for payment source | Admitting: Family Medicine

## 2013-12-17 VITALS — BP 126/84 | Ht 62.0 in | Wt 144.0 lb

## 2013-12-17 DIAGNOSIS — E785 Hyperlipidemia, unspecified: Secondary | ICD-10-CM

## 2013-12-17 DIAGNOSIS — E119 Type 2 diabetes mellitus without complications: Secondary | ICD-10-CM

## 2013-12-17 DIAGNOSIS — I10 Essential (primary) hypertension: Secondary | ICD-10-CM

## 2013-12-17 DIAGNOSIS — M7710 Lateral epicondylitis, unspecified elbow: Secondary | ICD-10-CM

## 2013-12-17 DIAGNOSIS — M7712 Lateral epicondylitis, left elbow: Secondary | ICD-10-CM | POA: Insufficient documentation

## 2013-12-17 DIAGNOSIS — J301 Allergic rhinitis due to pollen: Secondary | ICD-10-CM

## 2013-12-17 DIAGNOSIS — J309 Allergic rhinitis, unspecified: Secondary | ICD-10-CM | POA: Insufficient documentation

## 2013-12-17 NOTE — Progress Notes (Signed)
   Subjective:    Patient ID: Bianca Arroyo, female    DOB: [birth year], 63 y.o.   MRN: [id number]  HPI Patient is here for a 1 month follow up. Patient reports her elbow pain is improved. This is particularly when she wears her forearm strap.   Patient has concern with a cough that has developed. On further history she feels it is likely allergies. Notes a tendency to develop this time year. Generally takes over-the-counter Coricidin. No fever no chills no yellowish phlegm etc.  Reports less numbness and tingling in feet. Had nerve conduction studies a podiatrist. We did blood work here none substantial.  Still reportedly feels the same bump on posterior right neck.   Review of Systems    no headache no vomiting no diarrhea no abdominal pain no change in bowel habits ROS otherwise Objective:   Physical Exam  Alert no apparent distress hydration good. HEENT moderate nasal congestion clear discharge neck supple right posterior cervical within normal limits. Lungs clear. Heart rare in rhythm. Left lateral elbow tenderness deep palpation. Feet exam see present illness completely normal      Assessment & Plan:  Impression diabetic neuropathy quiet at this time. Not evident on exam. #2 type 2 diabetes good control. #3 lateral epicondylitis discussed improving. #4 allergic rhinitis no evidence of joint infection discussed. #5 cough likely related to #4 plan maintain Claritin when necessary discussed. Maintain other medications. No change in diabetes treatment. Diet exercise discussed. Check in 6 months for medical problems and in 2 months for women's preventive health checkup. WSL

## 2013-12-17 NOTE — Patient Instructions (Signed)
May add daily claritin 10mg  each morn, then use other antihistamines in the evening

## 2013-12-18 ENCOUNTER — Telehealth: Payer: Self-pay | Admitting: Gastroenterology

## 2013-12-18 NOTE — Telephone Encounter (Signed)
Please call pt. She had a simple adenoma removed from her colon.   FOLLOW A HIGH FIBER DIET. AVOID ITEMS THAT CAUSE BLOATING.  Next colonoscopy in 5-10 years. 

## 2013-12-19 NOTE — Telephone Encounter (Signed)
LMOM to call.

## 2013-12-19 NOTE — Telephone Encounter (Signed)
Reminder in epic °

## 2013-12-19 NOTE — Telephone Encounter (Signed)
Pt returned call and was informed of results.  

## 2014-01-24 ENCOUNTER — Encounter: Payer: Self-pay | Admitting: Family Medicine

## 2014-01-24 ENCOUNTER — Ambulatory Visit (INDEPENDENT_AMBULATORY_CARE_PROVIDER_SITE_OTHER): Payer: No Typology Code available for payment source | Admitting: Family Medicine

## 2014-01-24 VITALS — BP 140/90 | Ht 62.0 in | Wt 145.0 lb

## 2014-01-24 DIAGNOSIS — J0100 Acute maxillary sinusitis, unspecified: Secondary | ICD-10-CM

## 2014-01-24 DIAGNOSIS — J301 Allergic rhinitis due to pollen: Secondary | ICD-10-CM

## 2014-01-24 MED ORDER — FLUTICASONE PROPIONATE 50 MCG/ACT NA SUSP: 2.0000 | Freq: Every day | NASAL | Status: DC

## 2014-01-24 MED ORDER — AMOXICILLIN 500 MG PO TABS: 500.0000 mg | ORAL_TABLET | Freq: Three times a day (TID) | ORAL | Status: DC

## 2014-01-24 NOTE — Progress Notes (Signed)
   Subjective:    Patient ID: Bianca Arroyo, female    DOB: Apr 16, 1951, 63 y.o.   MRN: [id number]  URI  This is a new problem. The current episode started more than 1 month ago. The problem has been unchanged. There has been no fever. Associated symptoms include congestion, coughing, rhinorrhea, sinus pain and a sore throat. Pertinent negatives include no chest pain, ear pain or wheezing. Treatments tried: Cough Syrup. The treatment provided no relief.   Patient states that she knows her blood pressure is elevated due to the cough medication she's been taking. She knows about the cough medication for patients that have high blood pressure that could be used.  Patient states that @ times she has coughing fits that causes leakage.   Review of Systems  Constitutional: Negative for fever and activity change.  HENT: Positive for congestion, rhinorrhea and sore throat. Negative for ear pain.   Eyes: Negative for discharge.  Respiratory: Positive for cough. Negative for shortness of breath and wheezing.   Cardiovascular: Negative for chest pain.   Some off and on fever    Objective:   Physical Exam  Nursing note and vitals reviewed. Constitutional: She appears well-developed.  HENT:  Head: Normocephalic.  Nose: Nose normal.  Mouth/Throat: Oropharynx is clear and moist. No oropharyngeal exudate.  Neck: Neck supple.  Cardiovascular: Normal rate and normal heart sounds.   No murmur heard. Pulmonary/Chest: Effort normal and breath sounds normal. She has no wheezes.  Lymphadenopathy:    She has no cervical adenopathy.  Skin: Skin is warm and dry.          Assessment & Plan:  Viral syndrome with secondary sinus infection antibiotics prescribed warning signs discussed should gradually get better if persistent coughing next that would be his a chest x-ray and possibly lab work has wellness exam coming up within the month

## 2014-01-30 ENCOUNTER — Telehealth: Payer: Self-pay | Admitting: Family Medicine

## 2014-01-30 ENCOUNTER — Other Ambulatory Visit: Payer: Self-pay | Admitting: *Deleted

## 2014-01-30 MED ORDER — BENZONATATE 200 MG PO CAPS: 200.0000 mg | ORAL_CAPSULE | Freq: Three times a day (TID) | ORAL | Status: DC | PRN

## 2014-01-30 NOTE — Telephone Encounter (Signed)
Tessalon 200 1 tid prn ,#24, 1 refill

## 2014-01-30 NOTE — Telephone Encounter (Signed)
Med sent to pharm. Pt notified.  

## 2014-01-30 NOTE — Telephone Encounter (Signed)
Patient seen on 01/24/14 for allergic rhinitis and sinusitis.  She said she is taking her prescribed meds, but they are not helping her cough.  Wants to know if we can call something in. [facility]

## 2014-01-30 NOTE — Telephone Encounter (Signed)
Pt wants something called in for cough. She is taking antibioic and nasal spray. She would like tessalon pearls for cough so she can take during the day.

## 2014-02-10 ENCOUNTER — Telehealth: Payer: Self-pay | Admitting: *Deleted

## 2014-02-10 NOTE — Telephone Encounter (Signed)
Last seen 01/24/14. rx request from [facility] for valacyclovir 500mg  tab

## 2014-02-10 NOTE — Telephone Encounter (Signed)
Ok plus 5 ref 

## 2014-02-11 MED ORDER — VALACYCLOVIR HCL 500 MG PO TABS: 500.0000 mg | ORAL_TABLET | Freq: Two times a day (BID) | ORAL | Status: DC

## 2014-02-11 NOTE — Telephone Encounter (Signed)
Rx sent electronically to pharmacy. 

## 2014-02-21 ENCOUNTER — Encounter: Payer: Self-pay | Admitting: Nurse Practitioner

## 2014-02-21 ENCOUNTER — Ambulatory Visit (INDEPENDENT_AMBULATORY_CARE_PROVIDER_SITE_OTHER): Payer: No Typology Code available for payment source | Admitting: Nurse Practitioner

## 2014-02-21 VITALS — BP 156/82 | Ht 62.0 in | Wt 144.8 lb

## 2014-02-21 DIAGNOSIS — J300 Vasomotor rhinitis: Secondary | ICD-10-CM

## 2014-02-21 DIAGNOSIS — Z0000 Encounter for general adult medical examination without abnormal findings: Secondary | ICD-10-CM

## 2014-02-21 DIAGNOSIS — Z23 Encounter for immunization: Secondary | ICD-10-CM

## 2014-02-22 ENCOUNTER — Other Ambulatory Visit: Payer: Self-pay | Admitting: Family Medicine

## 2014-02-24 ENCOUNTER — Other Ambulatory Visit: Payer: Self-pay | Admitting: Family Medicine

## 2014-02-27 NOTE — Progress Notes (Signed)
   Subjective:    Patient ID: Bianca Arroyo, female    DOB: [birth year], 63 y.o.   MRN: [id number]  HPI presents for her wellness exam. No new sexual partners. Had a colonoscopy about a month ago. No vaginal bleeding. No pelvic pain. Regular vision and dental exams. Complaints of cough and congestion for the past 2 months. Was on antibiotics 2 weeks ago. No headache. No fever. Continued mild head congestion. Has been taking decongestant which has raised her blood pressure slightly.    Review of Systems  Constitutional: Negative for fever, activity change, appetite change and fatigue.  HENT: Positive for postnasal drip and rhinorrhea. Negative for dental problem, ear pain, sinus pressure and sore throat.   Respiratory: Positive for cough. Negative for chest tightness, shortness of breath and wheezing.   Cardiovascular: Negative for chest pain and leg swelling.  Gastrointestinal: Negative for nausea, vomiting, abdominal pain, diarrhea, constipation and abdominal distention.  Genitourinary: Negative for dysuria, urgency, frequency, vaginal bleeding, vaginal discharge, enuresis, difficulty urinating, genital sores and pelvic pain.       Objective:   Physical Exam  Constitutional: She is oriented to person, place, and time. She appears well-developed. No distress.  HENT:  Right Ear: External ear normal.  Left Ear: External ear normal.  Mouth/Throat: Oropharynx is clear and moist.  TMs mild clear effusion, no erythema. Pharynx clear. Neck supple with mild soft anterior adenopathy.  Neck: Normal range of motion. Neck supple. No tracheal deviation present. No thyromegaly present.  Cardiovascular: Normal rate, regular rhythm and normal heart sounds.  Exam reveals no gallop.   No murmur heard. Pulmonary/Chest: Effort normal and breath sounds normal.  Abdominal: Soft. She exhibits no distension. There is no tenderness.  Genitourinary: Vagina normal and uterus normal. No vaginal discharge  found.  External GU no rashes or lesions. Vagina pale, no discharge. Cervix normal limit in appearance, no CMT. Bimanual exam no tenderness or obvious masses. Ovaries nonpalpable.  Musculoskeletal: She exhibits no edema.  Lymphadenopathy:    She has cervical adenopathy.  Neurological: She is alert and oriented to person, place, and time.  Skin: Skin is warm and dry. No rash noted.  Psychiatric: She has a normal mood and affect. Her behavior is normal.  Vitals reviewed. Breast exam: Dense tissue, no dominant masses. Axilla no adenopathy.        Assessment & Plan:  Routine general medical examination at a health care facility  Need for vaccination - Plan: Flu Vaccine QUAD 36+ mos IM  Vasomotor rhinitis  Recommend stopping decongestant due to affect on blood pressure. Switch to OTC antihistamine and steroid nasal spray. Callback in 7-10 days if no improvement, sooner if worse. Encouraged healthy diet regular activity and daily vitamin D/calcium supplementation. Return in about 4 months (around 06/22/2014). Next physical in 1 year.

## 2014-02-28 ENCOUNTER — Encounter: Payer: Self-pay | Admitting: Nurse Practitioner

## 2014-03-24 ENCOUNTER — Other Ambulatory Visit: Payer: Self-pay | Admitting: *Deleted

## 2014-03-24 MED ORDER — LISINOPRIL 40 MG PO TABS: 40.0000 mg | ORAL_TABLET | Freq: Two times a day (BID) | ORAL | Status: DC

## 2014-04-01 ENCOUNTER — Other Ambulatory Visit: Payer: Self-pay | Admitting: *Deleted

## 2014-04-01 MED ORDER — HYDROCHLOROTHIAZIDE 25 MG PO TABS: 25.0000 mg | ORAL_TABLET | Freq: Two times a day (BID) | ORAL | Status: DC

## 2014-04-07 ENCOUNTER — Other Ambulatory Visit: Payer: Self-pay | Admitting: *Deleted

## 2014-04-07 MED ORDER — FEBUXOSTAT 40 MG PO TABS: 40.0000 mg | ORAL_TABLET | Freq: Every day | ORAL | Status: DC

## 2014-04-16 ENCOUNTER — Other Ambulatory Visit: Payer: Self-pay | Admitting: Family Medicine

## 2014-04-16 DIAGNOSIS — Z1231 Encounter for screening mammogram for malignant neoplasm of breast: Secondary | ICD-10-CM

## 2014-05-16 ENCOUNTER — Ambulatory Visit (HOSPITAL_COMMUNITY)
Admission: RE | Admit: 2014-05-16 | Discharge: 2014-05-16 | Disposition: A | Discharge status: Home / Self Care | Payer: Medicare Other | Source: Ambulatory Visit | Attending: Family Medicine | Admitting: Family Medicine

## 2014-05-16 DIAGNOSIS — Z1231 Encounter for screening mammogram for malignant neoplasm of breast: Secondary | ICD-10-CM | POA: Diagnosis not present

## 2014-05-25 ENCOUNTER — Other Ambulatory Visit: Payer: Self-pay | Admitting: Family Medicine

## 2014-06-03 ENCOUNTER — Other Ambulatory Visit: Payer: Self-pay | Admitting: Family Medicine

## 2014-06-10 ENCOUNTER — Other Ambulatory Visit: Payer: Self-pay | Admitting: *Deleted

## 2014-06-10 MED ORDER — HYDROCHLOROTHIAZIDE 25 MG PO TABS: 25.0000 mg | ORAL_TABLET | Freq: Two times a day (BID) | ORAL | Status: DC

## 2014-06-17 ENCOUNTER — Encounter: Payer: Self-pay | Admitting: Family Medicine

## 2014-06-17 ENCOUNTER — Ambulatory Visit (INDEPENDENT_AMBULATORY_CARE_PROVIDER_SITE_OTHER): Payer: Medicare Other | Admitting: Family Medicine

## 2014-06-17 VITALS — BP 148/88 | Ht 62.0 in | Wt 145.2 lb

## 2014-06-17 DIAGNOSIS — I10 Essential (primary) hypertension: Secondary | ICD-10-CM

## 2014-06-17 DIAGNOSIS — J301 Allergic rhinitis due to pollen: Secondary | ICD-10-CM | POA: Diagnosis not present

## 2014-06-17 DIAGNOSIS — Z79899 Other long term (current) drug therapy: Secondary | ICD-10-CM

## 2014-06-17 DIAGNOSIS — E119 Type 2 diabetes mellitus without complications: Secondary | ICD-10-CM | POA: Diagnosis not present

## 2014-06-17 DIAGNOSIS — E785 Hyperlipidemia, unspecified: Secondary | ICD-10-CM

## 2014-06-17 LAB — POCT GLYCOSYLATED HEMOGLOBIN (HGB A1C): Hemoglobin A1C: 6.5

## 2014-06-17 LAB — HM DIABETES EYE EXAM

## 2014-06-17 NOTE — Progress Notes (Signed)
   Subjective:    Patient ID: Bianca Arroyo, female    DOB: [birth year], 64 y.o.   MRN: [id number]  Diabetes She presents for her follow-up diabetic visit. She has type 2 diabetes mellitus. Risk factors for coronary artery disease include diabetes mellitus, hypertension, post-menopausal and dyslipidemia. Current diabetic treatment includes oral agent (monotherapy). She is compliant with treatment all of the time. She is following a diabetic diet. She has not had a previous visit with a dietitian. She does not see a podiatrist.Eye exam is not current.   Results for orders placed or performed in visit on 06/17/14  POCT glycosylated hemoglobin (Hb A1C)  Result Value Ref Range   Hemoglobin A1C 6.5     Patient having problems with cough and congestion-allergies. Started since oct, ongoing, claritin plus flonase . Cough comes and goes.  Claims compliance with blood pressure medicine. No obvious side effects. Watching salt intake.  Claims compliance with lipid medicines. Mostly watch her diet. No side effects from the medicine.  Patient also having floaters in eyes and has appt with eye doctor today.  Exercise overall doing well, doing pretty good with it,  Still dancing and exercising  Review of Systems No headache no chest pain no back pain abdominal pain no change in bowel habits no blood in stool    Objective:   Physical Exam Alert no acute distress. Vitals stable blood pressure good on repeat HEENT slight nasal congestion lungs clear heart rare rhythm ankles without edema       Assessment & Plan:  Impression 1 type 2 diabetes great control #2 hypertension great control #3 hyperlipidemia status uncertain #4 allergies with intermittent postnasal cough discussed plan appropriate blood work. Maintain same medications. Diet exercise discussed. WSL

## 2014-06-24 DIAGNOSIS — Z79899 Other long term (current) drug therapy: Secondary | ICD-10-CM | POA: Diagnosis not present

## 2014-06-24 DIAGNOSIS — E785 Hyperlipidemia, unspecified: Secondary | ICD-10-CM | POA: Diagnosis not present

## 2014-06-25 LAB — LIPID PANEL
Cholesterol: 158 mg/dL (ref 0–200)
HDL: 63 mg/dL (ref >=46)
LDL Cholesterol: 82 mg/dL (ref 0–99)
Total CHOL/HDL Ratio: 2.5 Ratio
Triglycerides: 65 mg/dL (ref <150)
VLDL: 13 mg/dL (ref 0–40)

## 2014-06-25 LAB — HEPATIC FUNCTION PANEL
ALT: 14 U/L (ref 0–35)
AST: 19 U/L (ref 0–37)
Albumin: 3.8 g/dL (ref 3.5–5.2)
Alkaline Phosphatase: 63 U/L (ref 39–117)
BILIRUBIN TOTAL: 0.5 mg/dL (ref 0.2–1.2)
Bilirubin, Direct: 0.1 mg/dL (ref 0.0–0.3)
Indirect Bilirubin: 0.4 mg/dL (ref 0.2–1.2)
TOTAL PROTEIN: 6.7 g/dL (ref 6.0–8.3)

## 2014-06-29 ENCOUNTER — Encounter: Payer: Self-pay | Admitting: Family Medicine

## 2014-07-01 ENCOUNTER — Other Ambulatory Visit: Payer: Self-pay | Admitting: Family Medicine

## 2014-07-01 DIAGNOSIS — H43812 Vitreous degeneration, left eye: Secondary | ICD-10-CM | POA: Diagnosis not present

## 2014-07-07 ENCOUNTER — Encounter: Payer: Self-pay | Admitting: *Deleted

## 2014-07-11 ENCOUNTER — Other Ambulatory Visit: Payer: Self-pay | Admitting: Family Medicine

## 2014-07-21 ENCOUNTER — Telehealth: Payer: Self-pay | Admitting: Family Medicine

## 2014-07-21 MED ORDER — BENZONATATE 100 MG PO CAPS: 100.0000 mg | ORAL_CAPSULE | Freq: Three times a day (TID) | ORAL | Status: DC | PRN

## 2014-07-21 NOTE — Telephone Encounter (Signed)
Tess perles 11 numb 24 one q 8 prn cough

## 2014-07-21 NOTE — Addendum Note (Signed)
Addended by: Jesusita Oka on: 07/21/2014 10:40 AM   Modules accepted: Orders, Medications

## 2014-07-21 NOTE — Telephone Encounter (Signed)
Notified patient that med was sent to pharmacy.  

## 2014-07-21 NOTE — Addendum Note (Signed)
Addended by: Jesusita Oka on: 07/21/2014 10:55 AM   Modules accepted: Orders

## 2014-07-21 NOTE — Telephone Encounter (Signed)
Patient called stating coughing a lot especially at night for 2 wks now. Can you call in something for cough at [facility].

## 2014-07-29 ENCOUNTER — Encounter: Payer: Self-pay | Admitting: Family Medicine

## 2014-07-30 ENCOUNTER — Ambulatory Visit (INDEPENDENT_AMBULATORY_CARE_PROVIDER_SITE_OTHER): Payer: Medicare Other | Admitting: Family Medicine

## 2014-07-30 ENCOUNTER — Encounter: Payer: Self-pay | Admitting: Family Medicine

## 2014-07-30 ENCOUNTER — Ambulatory Visit (HOSPITAL_COMMUNITY)
Admission: RE | Admit: 2014-07-30 | Discharge: 2014-07-30 | Disposition: A | Discharge status: Home / Self Care | Payer: Medicare Other | Source: Ambulatory Visit | Attending: Family Medicine | Admitting: Family Medicine

## 2014-07-30 VITALS — BP 140/80 | Temp 98.8°F | Ht 62.0 in | Wt 142.4 lb

## 2014-07-30 DIAGNOSIS — R059 Cough, unspecified: Secondary | ICD-10-CM

## 2014-07-30 DIAGNOSIS — R05 Cough: Secondary | ICD-10-CM

## 2014-07-30 MED ORDER — PANTOPRAZOLE SODIUM 40 MG PO TBEC: 40.0000 mg | DELAYED_RELEASE_TABLET | Freq: Every day | ORAL | Status: DC

## 2014-07-30 MED ORDER — MONTELUKAST SODIUM 10 MG PO TABS: 10.0000 mg | ORAL_TABLET | Freq: Every day | ORAL | Status: DC

## 2014-07-30 NOTE — Telephone Encounter (Signed)
Pt seen on 06/17/14  My Chart Message:  I have been taking allergy medications (Claritin, Allegra, Tessalon, nasal sprays, cough syrups, cough drops, Coricidin, store brand allergy tabs, Mucinex, nasal washes using neti pot, etc) almost non-stop since October 2015. Each day - 5 to 6 different types 4, 6, 8, 12, &/or 24 hours apart. All to no avail. The last few nights I have difficulty sleeping - I lay down and 30 minutes later I wake up coughing violently. And coughing at almost any time during the day. I have been taking Claritin and Allegra for years but now they do not seem to help much. Do you advise getting a referral to an Allergist for allergy tests and immunotherapy?

## 2014-07-30 NOTE — Progress Notes (Signed)
   Subjective:    Patient ID: Bianca Arroyo, female    DOB: [birth year], 64 y.o.   MRN: [id number]  Cough This is a new problem. The current episode started more than 1 month ago. The problem has been unchanged. The cough is non-productive. Associated symptoms include postnasal drip and a sore throat. Associated symptoms comments: Runny nose. Nothing aggravates the symptoms. She has tried prescription cough suppressant and OTC cough suppressant (claritin, coricidin) for the symptoms. The treatment provided no relief.   Patient has no other concerns at this time.   Allergies bad over the the past yr  Runny eyes and runny nose  Cough more agravating at night time  Sometime coughw with talking, usually seasonal allergies  In the past  No smoke exp  no dx pf asthma  Reflux or heartburn was a period seven or eight yrs.  Bad coughing thru out the night  Cough ids dry non productive    Review of Systems  HENT: Positive for postnasal drip and sore throat.   Respiratory: Positive for cough.    No headache no chest pain no back pain    Objective:   Physical Exam  Alert HEENT mild nasal congestion pharynx normal neck supple. Lungs clear. Heart regular in rhythm. Abdominal exam slight epigastric tenderness      Assessment & Plan:  Impression chronic cough. On further history most likely contributing factors include allergic rhinitis and potentially reflux. Long discussion held. Regarding multi-factorial etiology plan initiate Singulair. Initiate proton expect. Chest x-ray further recommendations based results follow-up as scheduled 25 minutes spent most in discussion WSL

## 2014-08-03 ENCOUNTER — Encounter: Payer: Self-pay | Admitting: Family Medicine

## 2014-08-06 ENCOUNTER — Other Ambulatory Visit: Payer: Self-pay | Admitting: Family Medicine

## 2014-08-25 ENCOUNTER — Other Ambulatory Visit: Payer: Self-pay | Admitting: Family Medicine

## 2014-08-26 ENCOUNTER — Ambulatory Visit (INDEPENDENT_AMBULATORY_CARE_PROVIDER_SITE_OTHER): Payer: Medicare Other | Admitting: Family Medicine

## 2014-08-26 ENCOUNTER — Encounter: Payer: Self-pay | Admitting: Family Medicine

## 2014-08-26 VITALS — BP 132/80 | Ht 62.0 in | Wt 142.1 lb

## 2014-08-26 DIAGNOSIS — J301 Allergic rhinitis due to pollen: Secondary | ICD-10-CM | POA: Diagnosis not present

## 2014-08-26 DIAGNOSIS — R05 Cough: Secondary | ICD-10-CM

## 2014-08-26 DIAGNOSIS — R059 Cough, unspecified: Secondary | ICD-10-CM

## 2014-08-26 DIAGNOSIS — I10 Essential (primary) hypertension: Secondary | ICD-10-CM

## 2014-08-26 MED ORDER — LOSARTAN POTASSIUM 100 MG PO TABS: 100.0000 mg | ORAL_TABLET | Freq: Every day | ORAL | Status: DC

## 2014-08-26 NOTE — Progress Notes (Signed)
   Subjective:    Patient ID: Bianca Arroyo, female    DOB: 1950/12/24, 64 y.o.   MRN: [id number]  Cough This is a chronic problem. The current episode started more than 1 month ago. The problem has been unchanged. The problem occurs every few hours. The cough is productive of sputum. Associated symptoms include chest pain. Nothing aggravates the symptoms. She has tried prescription cough suppressant for the symptoms. The treatment provided no relief.   Patient states Singulair has not helped at all. Still having chronic aggravating cough. Very frustrating the patient.  Compliant with the Protonix.  Is been on lisinopril for years.   Review of Systems  Respiratory: Positive for cough.   Cardiovascular: Positive for chest pain.       Objective:   Physical Exam Alert vital stable HEENT normal. Neck supple lungs clear. Heart regular in rhythm.   Chest x-ray reviewed    Assessment & Plan:  Impression chronic cough once again discussed at length plan Will stop Singulair. Maintain Protonix. Stop lisinopril. Start losartan 100 daily. Recheck in 6 weeks. Many questions answered. 25 minutes spent most in discussion WSL

## 2014-09-10 ENCOUNTER — Ambulatory Visit: Payer: Medicare Other | Admitting: Family Medicine

## 2014-09-27 ENCOUNTER — Encounter: Payer: Self-pay | Admitting: Family Medicine

## 2014-09-28 ENCOUNTER — Encounter: Payer: Self-pay | Admitting: Family Medicine

## 2014-09-29 ENCOUNTER — Encounter: Payer: Self-pay | Admitting: Family Medicine

## 2014-09-29 ENCOUNTER — Ambulatory Visit (INDEPENDENT_AMBULATORY_CARE_PROVIDER_SITE_OTHER): Payer: Medicare Other | Admitting: Family Medicine

## 2014-09-29 VITALS — BP 138/92 | Temp 99.0°F | Ht 62.0 in | Wt 143.0 lb

## 2014-09-29 DIAGNOSIS — R05 Cough: Secondary | ICD-10-CM | POA: Diagnosis not present

## 2014-09-29 DIAGNOSIS — R053 Chronic cough: Secondary | ICD-10-CM

## 2014-09-29 MED ORDER — ALBUTEROL SULFATE HFA 108 (90 BASE) MCG/ACT IN AERS: 2.0000 | INHALATION_SPRAY | Freq: Four times a day (QID) | RESPIRATORY_TRACT | Status: DC | PRN

## 2014-09-29 MED ORDER — CEFDINIR 300 MG PO CAPS: 300.0000 mg | ORAL_CAPSULE | Freq: Two times a day (BID) | ORAL | Status: DC

## 2014-09-29 NOTE — Progress Notes (Signed)
   Subjective:    Patient ID: Bianca Arroyo, female    DOB: 01-20-1951, 64 y.o.   MRN: [id number]  Cough This is a new problem. Episode onset: 8 months ago. Associated symptoms include ear pain, headaches, nasal congestion, a sore throat and wheezing. Treatments tried: OTC cough syrup, claritin, acid reflux med, coricidin, otc cold meds.   Cough has acted up the past weekend  Cough productive of phlegm  Greenish and gunky  Pt has headache and sore throat, thinks this is due to the coughing  Wheezing and whistling sound had times when breathing.  Please see prior notes. We have been seeing this patient for cough off-and-on now for 8 months. Had a negative chest x-ray. We've tried numerous interventions. Including proton pump inhibitors. Cessation of ACE inhibitor's. Trial of Singulair, etc. Still ongoing substantial cough.   Review of Systems  HENT: Positive for ear pain and sore throat.   Respiratory: Positive for cough and wheezing.   Neurological: Positive for headaches.       Objective:   Physical Exam  Alert vitals stable. HEENT slight nasal congestion pharynx normal. Neck supple lungs currently clear heart regular in rhythm.      Assessment & Plan:  Impression chronic cough question reactive airway element. Discussed at length. Time for pulmonary referral. Plan anti-buttock prescribed. Albuterol 2 sprays 4 times a day. Maintain until sees pulmonary specialist. Multiple questions answered WSL

## 2014-10-03 ENCOUNTER — Ambulatory Visit (INDEPENDENT_AMBULATORY_CARE_PROVIDER_SITE_OTHER): Payer: Medicare Other | Admitting: Internal Medicine

## 2014-10-03 ENCOUNTER — Encounter: Payer: Self-pay | Admitting: Internal Medicine

## 2014-10-03 VITALS — BP 130/76 | HR 81 | Ht 62.0 in | Wt 141.9 lb

## 2014-10-03 DIAGNOSIS — I10 Essential (primary) hypertension: Secondary | ICD-10-CM | POA: Diagnosis not present

## 2014-10-03 DIAGNOSIS — R05 Cough: Secondary | ICD-10-CM | POA: Insufficient documentation

## 2014-10-03 DIAGNOSIS — R058 Other specified cough: Secondary | ICD-10-CM | POA: Insufficient documentation

## 2014-10-03 MED ORDER — PREDNISONE 10 MG PO TABS: ORAL_TABLET | ORAL | Status: DC

## 2014-10-03 MED ORDER — VALSARTAN 160 MG PO TABS: 160.0000 mg | ORAL_TABLET | Freq: Every day | ORAL | Status: DC

## 2014-10-03 MED ORDER — TRAMADOL HCL 50 MG PO TABS: ORAL_TABLET | ORAL | Status: DC

## 2014-10-03 NOTE — Progress Notes (Signed)
Subjective:    Patient ID: Bianca Arroyo, female    DOB: 01-10-1951,    MRN: [id number]  HPI  45 yobf never smoker TB age 64 months with year round rhinitis as far back as can she can remember but no sob/cough or wheeze with sensation of something stuck in throat while living in [place] around 2006 eval by ENT dx reflux rx helped > then started coughing fall 2015 and persistent ever since so referred to pulmonary clinic  10/03/2014   by Dr Baltazar Apo    10/03/2014 1st [facility] office visit/ Bianca Arroyo   Chief Complaint  Patient presents with  . Pulmonary Consult    Referred by Dr. Baltazar Apo. Pt c/o cough x 6-8 months-prod with clear to brown/green sputum.    indolent onset daily cough fall of 2015  worse when lie down at hs present all day long worse with talking and sometimes eating. On abx at ov 5/10 still some green / urinates from coughing/ no vomiting  Off lisinopril x one month > cozar  No sob unless coughing/ no better on flonase/ saba    No obvious day to day or daytime variabilty or assoc sob unless coughing  cp or chest tightness, subjective wheeze overt sinus or hb symptoms. No unusual exp hx or h/o childhood pna/ asthma or knowledge of premature birth.  Sleeping ok without nocturnal  or early am exacerbation  of respiratory  c/o's or need for noct saba. Also denies any obvious fluctuation of symptoms with weather or environmental changes or other aggravating or alleviating factors except as outlined above   Current Medications, Allergies, Complete Past Medical History, Past Surgical History, Family History, and Social History were reviewed in [employer] record.            Review of Systems  Constitutional: Negative for fever, chills and unexpected weight change.  HENT: Positive for congestion, sneezing and sore throat. Negative for dental problem, ear pain, nosebleeds, postnasal drip, rhinorrhea, sinus pressure, trouble swallowing  and voice change.   Eyes: Negative for visual disturbance.  Respiratory: Positive for cough. Negative for choking and shortness of breath.   Cardiovascular: Negative for chest pain and leg swelling.  Gastrointestinal: Negative for vomiting, abdominal pain and diarrhea.  Genitourinary: Negative for difficulty urinating.  Musculoskeletal: Positive for arthralgias.  Skin: Negative for rash.  Neurological: Negative for tremors, syncope and headaches.  Hematological: Does not bruise/bleed easily.       Objective:   Physical Exam amb bf nad  Wt Readings from Last 3 Encounters:  10/03/14 141 lb 14.4 oz (64.365 kg)  09/29/14 143 lb (64.864 kg)  08/26/14 142 lb 2 oz (64.467 kg)    Vital signs reviewed  HEENT: nl dentition, turbinates, and orophanx. Nl external ear canals without cough reflex   NECK :  without JVD/Nodes/TM/ nl carotid upstrokes bilaterally   LUNGS: no acc muscle use, clear to A and P bilaterally without cough on insp or exp maneuvers   CV:  RRR  no s3 or murmur or increase in P2, no edema   ABD:  soft and nontender with nl excursion in the supine position. No bruits or organomegaly, bowel sounds nl  MS:  warm without deformities, calf tenderness, cyanosis or clubbing  SKIN: warm and dry without lesions    NEURO:  alert, approp, no deficits   I personally reviewed images and agree with radiology impression as follows:  CXR:   07/30/14 Negative two view chest.  Assessment & Plan:

## 2014-10-03 NOTE — Patient Instructions (Addendum)
Stop losartan  Start diovan 160 mg one daily in place  Pantoprazole (protonix) 40 mg   Take  30-60 min before first meal of the day and Pepcid (famotidine)  20 mg one @  bedtime until return to office - this is the best way to tell whether stomach acid is contributing to your problem.  Prednisone 10 mg take  4 each am x 2 days,   2 each am x 2 days,  1 each am x 2 days and stop   Take delsym two tsp every 12 hours and supplement if needed with  tramadol 50 mg up to 2 every 4 hours to suppress the urge to cough. Swallowing water or using ice chips/non mint and menthol containing candies (such as lifesavers or sugarless jolly ranchers) are also effective.  You should rest your voice and avoid activities that you know make you cough.  Once you have eliminated the cough for 3 straight days try reducing the tramadol first,  then the delsym as tolerated.      GERD (REFLUX)  is an extremely common cause of respiratory symptoms just like yours , many times with no obvious heartburn at all.    It can be treated with medication, but also with lifestyle changes including elevation of the head of your bed (ideally with 6 inch  bed blocks),  Smoking cessation, avoidance of late meals, excessive alcohol, and avoid fatty foods, chocolate, peppermint, colas, red wine, and acidic juices such as orange juice.  NO MINT OR MENTHOL PRODUCTS SO NO COUGH DROPS  USE SUGARLESS CANDY INSTEAD (Jolley ranchers or Stover's or Life Savers) or even ice chips will also do - the key is to swallow to prevent all throat clearing. NO OIL BASED VITAMINS - use powdered substitutes.  If not better >>>next step sinus CT > [place] at [phone]  > After the scan return with all medications to regroup.    If you are satisfied >>with your treatment plan,  let your doctor know and he/she can either refill your medications or you can return here when your prescription runs out.     If in any way you are not 100% satisfied,  please tell us.   If 100% better, tell your friends!  Pulmonary follow up is as needed

## 2014-10-03 NOTE — Assessment & Plan Note (Signed)
For reasons that may related to vascular permability and nitric oxide pathways but not elevated  bradykinin levels (as seen with  ACEi use) losartan in the generic form has been reported now from mulitple sources  to cause a similar pattern of non-specific  upper airway symptoms as seen with acei.   This has not been reported with exposure to the other ARB's to date, so it seems reasonable for now to try either generic diovan or avapro if ARB needed or use an alternative class altogether.  See:  Lelon Frohlich Allergy Asthma Immunol  2008: 101: p 495-499   Try diovan 160 mg and f/u with Dr Wolfgang Phoenix if better, here in pulmonary clinic if not

## 2014-10-03 NOTE — Assessment & Plan Note (Addendum)
The most common causes of chronic cough in immunocompetent adults include the following: upper airway cough syndrome (UACS), previously referred to as postnasal drip syndrome (PNDS), which is caused by variety of rhinosinus conditions; (2) asthma; (3) GERD; (4) chronic bronchitis from cigarette smoking or other inhaled environmental irritants; (5) nonasthmatic eosinophilic bronchitis; and (6) bronchiectasis.   These conditions, singly or in combination, have accounted for up to 94% of the causes of chronic cough in prospective studies.   Other conditions have constituted no >6% of the causes in prospective studies These have included bronchogenic carcinoma, chronic interstitial pneumonia, sarcoidosis, left ventricular failure, ACEI-induced cough, and aspiration from a condition associated with pharyngeal dysfunction.    Chronic cough is often simultaneously caused by more than one condition. A single cause has been found from 38 to 82% of the time, multiple causes from 18 to 62%. Multiply caused cough has been the result of three diseases up to 42% of the time.       Based on hx and exam, this is most likely:  Classic Upper airway cough syndrome, so named because it's frequently impossible to sort out how much is  CR/sinusitis with freq throat clearing (which can be related to primary GERD)   vs  causing  secondary (" extra esophageal")  GERD from wide swings in gastric pressure that occur with throat clearing, often  promoting self use of mint and menthol lozenges that reduce the lower esophageal sphincter tone and exacerbate the problem further in a cyclical fashion.   These are the same pts (now being labeled as having "irritable larynx syndrome" by some cough centers) who not infrequently have a history of having failed to tolerate ace inhibitors(and even now losartan- see hbp) ,  dry powder inhalers or biphosphonates or report having atypical reflux symptoms that don't respond to standard doses of  PPI , and are easily confused as having aecopd or asthma flares by even experienced allergists/ pulmonologists.     I had an extended discussion with the patient reviewing all relevant studies completed to date - 34 m face to face The standardized cough guidelines published in Chest by Lissa Morales in 2006 are still the best available and consist of a multiple step process (up to 12!) , not a single office visit,  and are intended  to address this problem logically,  with an alogrithm dependent on response to empiric treatment at  each progressive step  to determine a specific diagnosis with  minimal addtional testing needed. Therefore if adherence is an issue or can't be accurately verified,  it's very unlikely the standard evaluation and treatment will be successful here.    Furthermore, response to therapy (other than acute cough suppression, which should only be used short term with avoidance of narcotic containing cough syrups if possible), can be a gradual process for which the patient may perceive immediate benefit.  Unlike going to an eye doctor where the best perscription is almost always the first one and is immediately effective, this is almost never the case in the management of chronic cough syndromes. Therefore the patient needs to commit up front to consistently adhere to recommendations  for up to 6 weeks of therapy directed at the likely underlying problem(s) before the response can be reasonably evaluated. See instructions for specific recommendations which were reviewed directly with the patient who was given a copy with highlighter outlining the key components.    The first step is to maximize acid suppression and eliminate losartan  and cyclical cough then regroup if not better in 2 weeks with sinus ct next step

## 2014-10-07 ENCOUNTER — Ambulatory Visit: Payer: Medicare Other | Admitting: Family Medicine

## 2014-10-09 ENCOUNTER — Telehealth: Payer: Self-pay | Admitting: Internal Medicine

## 2014-10-09 DIAGNOSIS — R05 Cough: Secondary | ICD-10-CM

## 2014-10-09 DIAGNOSIS — R058 Other specified cough: Secondary | ICD-10-CM

## 2014-10-09 NOTE — Telephone Encounter (Signed)
Per 10/03/14 OV: If not better >>>next step sinus CT > [place] at [phone] > After the scan return with all medications to regroup. --  Called pt. She reports she is some better but has a deep prod cough (brown-yellow phlem and at times it is mixed with blood), PND, nasal cong. Only blows clear phlem from nose. She wants to know if she should still do the sinus CT since she does get blood up at times. Please advise MW thanks

## 2014-10-09 NOTE — Telephone Encounter (Signed)
lmtcb for pt.  

## 2014-10-09 NOTE — Telephone Encounter (Signed)
I have placed order for sinus CT.  Pt aware. Nothing further needed

## 2014-10-09 NOTE — Telephone Encounter (Signed)
Patient called back, may be reached at [phone].

## 2014-10-09 NOTE — Telephone Encounter (Signed)
Yes, limited ct dx cough  

## 2014-10-10 ENCOUNTER — Other Ambulatory Visit: Payer: Self-pay | Admitting: Family Medicine

## 2014-10-14 ENCOUNTER — Ambulatory Visit (INDEPENDENT_AMBULATORY_CARE_PROVIDER_SITE_OTHER)
Admission: RE | Admit: 2014-10-14 | Discharge: 2014-10-14 | Disposition: A | Discharge status: Home / Self Care | Payer: Medicare Other | Source: Ambulatory Visit | Attending: Internal Medicine | Admitting: Internal Medicine

## 2014-10-14 DIAGNOSIS — J3489 Other specified disorders of nose and nasal sinuses: Secondary | ICD-10-CM | POA: Diagnosis not present

## 2014-10-14 DIAGNOSIS — R05 Cough: Secondary | ICD-10-CM

## 2014-10-14 DIAGNOSIS — R058 Other specified cough: Secondary | ICD-10-CM

## 2014-10-15 ENCOUNTER — Telehealth: Payer: Self-pay | Admitting: Internal Medicine

## 2014-10-15 ENCOUNTER — Encounter: Payer: Self-pay | Admitting: Family Medicine

## 2014-10-15 NOTE — Telephone Encounter (Signed)
Result Note     Call patient : Study is unremarkable for acute sinusitis but has lots of chronic inflammation that might be contributing to symptoms but no need to change recs at this point - be sure has f/u ov  --  I spoke with patient about results and she verbalized understanding and had no questions

## 2014-10-15 NOTE — Progress Notes (Signed)
Quick Note:  LMTCB ______ 

## 2014-10-17 ENCOUNTER — Encounter: Payer: Self-pay | Admitting: Internal Medicine

## 2014-10-17 NOTE — Telephone Encounter (Signed)
msg from my chart: ................................ The other day I received a message from office in reference to my Sinus CT stating that the results were unremarkable. I read the results myself and I am having trouble understanding the findings. As a lay person when I read, and I quote, "there is mucosal thickening throughout both maxillary sinuses with probable retention cysts in both maxillary sinuses as well" this causes some concern for me. As a lay person, and I quote again, "considerable opacification of multiple ethmoid air cells is noted", this too causes me some concern. As a lay per, I quote, "there is mucosal thickening within the frontal and to a lesser degree the sphenoid sinus", more concern is invoked. And what is "diffuse paranasal sinus disease"? Of course, I can do an Internet search to find out, this too causes concern because of the diverse and conflicting information available on the Internet. And for someone to say these results are unremarkable, Why? To me, they are remarkable.  Dr. Melvyn Novas, please advise.

## 2014-10-20 ENCOUNTER — Other Ambulatory Visit: Payer: Self-pay | Admitting: Family Medicine

## 2014-10-28 ENCOUNTER — Other Ambulatory Visit (INDEPENDENT_AMBULATORY_CARE_PROVIDER_SITE_OTHER): Payer: Medicare Other

## 2014-10-28 ENCOUNTER — Ambulatory Visit (INDEPENDENT_AMBULATORY_CARE_PROVIDER_SITE_OTHER): Payer: Medicare Other | Admitting: Internal Medicine

## 2014-10-28 ENCOUNTER — Encounter: Payer: Self-pay | Admitting: Internal Medicine

## 2014-10-28 ENCOUNTER — Ambulatory Visit (INDEPENDENT_AMBULATORY_CARE_PROVIDER_SITE_OTHER)
Admission: RE | Admit: 2014-10-28 | Discharge: 2014-10-28 | Disposition: A | Discharge status: Home / Self Care | Payer: Medicare Other | Source: Ambulatory Visit | Attending: Internal Medicine | Admitting: Internal Medicine

## 2014-10-28 VITALS — BP 124/84 | HR 65 | Ht 62.0 in | Wt 138.8 lb

## 2014-10-28 DIAGNOSIS — R05 Cough: Secondary | ICD-10-CM | POA: Diagnosis not present

## 2014-10-28 DIAGNOSIS — J45991 Cough variant asthma: Secondary | ICD-10-CM | POA: Diagnosis not present

## 2014-10-28 DIAGNOSIS — R0602 Shortness of breath: Secondary | ICD-10-CM | POA: Diagnosis not present

## 2014-10-28 LAB — CBC WITH DIFFERENTIAL/PLATELET
BASOS ABS: 0.1 10*3/uL (ref 0.0–0.1)
Basophils Relative: 0.8 % (ref 0.0–3.0)
EOS ABS: 1.5 10*3/uL — AB (ref 0.0–0.7)
Eosinophils Relative: 17.9 % — ABNORMAL HIGH (ref 0.0–5.0)
HCT: 41.7 % (ref 36.0–46.0)
Hemoglobin: 13.9 g/dL (ref 12.0–15.0)
Lymphocytes Relative: 23.2 % (ref 12.0–46.0)
Lymphs Abs: 2 10*3/uL (ref 0.7–4.0)
MCHC: 33.3 g/dL (ref 30.0–36.0)
MCV: 84.5 fl (ref 78.0–100.0)
MONOS PCT: 10.7 % (ref 3.0–12.0)
Monocytes Absolute: 0.9 10*3/uL (ref 0.1–1.0)
Neutro Abs: 4 10*3/uL (ref 1.4–7.7)
Neutrophils Relative %: 47.4 % (ref 43.0–77.0)
Platelets: 308 10*3/uL (ref 150.0–400.0)
RBC: 4.93 Mil/uL (ref 3.87–5.11)
RDW: 14 % (ref 11.5–15.5)
WBC: 8.5 10*3/uL (ref 4.0–10.5)

## 2014-10-28 MED ORDER — PREDNISONE 10 MG PO TABS: ORAL_TABLET | ORAL | Status: DC

## 2014-10-28 MED ORDER — TRAMADOL HCL 50 MG PO TABS: ORAL_TABLET | ORAL | Status: DC

## 2014-10-28 MED ORDER — MOMETASONE FURO-FORMOTEROL FUM 100-5 MCG/ACT IN AERO: INHALATION_SPRAY | RESPIRATORY_TRACT | Status: DC

## 2014-10-28 MED ORDER — AMOXICILLIN-POT CLAVULANATE 875-125 MG PO TABS: 1.0000 | ORAL_TABLET | Freq: Two times a day (BID) | ORAL | Status: DC

## 2014-10-28 NOTE — Patient Instructions (Addendum)
Augmentin 875 mg take one pill twice daily  X 10 days - take at breakfast and supper with large glass of water.  It would help reduce the usual side effects (diarrhea and yeast infections) if you ate cultured yogurt at lunch.   Prednisone 10 mg take  4 each am x 2 days,   2 each am x 2 days,  1 each am x 2 days and stop   dulera 100 Take 2 puffs first thing in am and then another 2 puffs about 12 hours later.   Only use your albuterol (yellow inhaler=proventil) as a rescue medication to be used if you can't catch your breath by resting or doing a relaxed purse lip breathing pattern.  - The less you use it, the better it will work when you need it. - Ok to use up to 2 puffs  every 4 hours if you must but call for immediate appointment if use goes up over your usual need - Don't leave home without it !!  (think of it like the spare tire for your car)   Work on inhaler technique:  relax and gently blow all the way out then take a nice smooth deep breath back in, triggering the inhaler at same time you start breathing in.  Hold for up to 5 seconds if you can.  Rinse and gargle with water when done  Take delsym two tsp every 12 hours and supplement if needed with  tramadol 50 mg up to 2 every 4 hours to suppress the urge to cough. Swallowing water or using ice chips/non mint and menthol containing candies (such as lifesavers or sugarless jolly ranchers) are also effective.  You should rest your voice and avoid activities that you know make you cough.  Once you have eliminated the cough for 3 straight days try reducing the tramadol first,  then the delsym as tolerated.    Pantoprazole (protonix) 40 mg   Take  30-60 min before first meal of the day and Pepcid (famotidine)  20 mg one @  bedtime until return to office - this is the best way to tell whether stomach acid is contributing to your problem.    See Tammy NP w/in 2 weeks with all your medications, even over the counter meds, separated in two  separate bags, the ones you take no matter what vs the ones you stop once you feel better and take only as needed when you feel you need them.   Tammy  will generate for you a new user friendly medication calendar that will put [place] all on the same page re: your medication use.     Without this process, it simply isn't possible to assure that we are providing  your outpatient care  with  the attention to detail we feel you deserve.   If we cannot assure that you're getting that kind of care,  then we cannot manage your problem effectively from this clinic.  Once you have seen Tammy and we are sure that we're all on the same page with your medication use she will arrange follow up with me.   Please remember to go to the lab and x-ray department downstairs for your tests - we will call you with the results when they are available.

## 2014-10-28 NOTE — Progress Notes (Signed)
Subjective:    Patient ID: Bianca Arroyo, female    DOB: November 16, 1950,    MRN: [id number]  Brief patient profile:   58 yobf never smoker TB age 65 months with year round rhinitis= sneezing / runny eyes/nose  as far back as can she can remember but no sob/cough or wheeze with sensation of something stuck in throat while living in [place] around 2006 eval by ENT dx reflux rx helped > then started coughing fall 2015 and persistent ever since so referred to pulmonary clinic  10/03/2014   by Dr Baltazar Apo    History of Present Illness  10/03/2014 1st [facility] office visit/ Bianca Arroyo   Chief Complaint  Patient presents with  . Pulmonary Consult    Referred by Dr. Baltazar Apo. Pt c/o cough x 6-8 months-prod with clear to brown/green sputum.    indolent onset daily cough fall of 2015  worse when lie down at hs present all day long worse with talking and sometimes eating. On abx at ov 5/10 still some green / urinates from coughing/ no vomiting  Off lisinopril x one month > cozar  No sob unless coughing/ no better on flonase/ saba   rec Stop losartan  Start diovan 160 mg one daily in place  Pantoprazole (protonix) 40 mg   Take  30-60 min before first meal of the day and Pepcid (famotidine)  20 mg one @  Bedtime Prednisone 10 mg take  4 each am x 2 days,   2 each am x 2 days,  1 each am x 2 days and stop   Take delsym two tsp every 12 hours and supplement if needed with  tramadol 50 mg up to 2 every 4 hours to suppress the urge to cough. Swallowing water or using ice chips/non mint and menthol containing candies (such as lifesavers or sugarless jolly ranchers) are also effective.  You should rest your voice and avoid activities that you know make you cough. Once you have eliminated the cough for 3 straight days try reducing the tramadol first,  then the delsym as tolerated.   GERD diet  If not better >>>next step sinus CT >  Neg      10/28/2014 f/u ov/Bianca Arroyo re: cough since fall 2015  daily /  Did not bring   meds   Chief Complaint  Patient presents with  . Follow-up    Pt states that her cough is worse since her last visit- still bringing up brown to green sputum. She states now she has HA and "ears popping".  now sob even if not coughing and ? Better with saba but didn't bring it with her  Usually much better p prednisone, the only thing that helps/ then gradually worse cough and sob  and increase need for saba / worse at hs and early am but not usually awakening her Very poor hfa   No obvious day to day or daytime variability or assoc   cp or chest tightness, subjective wheeze or overt   hb symptoms. No unusual exp hx or h/o childhood pna/ asthma or knowledge of premature birth.   Also denies any obvious fluctuation of symptoms with weather or environmental changes or other aggravating or alleviating factors except as outlined above   Current Medications, Allergies, Complete Past Medical History, Past Surgical History, Family History, and Social History were reviewed in [employer] record.  ROS  The following are not active complaints unless bolded sore throat, dysphagia, dental  problems, itching, sneezing,  nasal congestion or excess/ purulent secretions, ear ache,   fever, chills, sweats, unintended wt loss, classically pleuritic or exertional cp, hemoptysis,  orthopnea pnd or leg swelling, presyncope, palpitations, abdominal pain, anorexia, nausea, vomiting, diarrhea  or change in bowel or bladder habits, change in stools or urine, dysuria,hematuria,  rash, arthralgias, visual complaints, headache, numbness, weakness or ataxia or problems with walking or coordination,  change in mood/affect or memory.          Objective:   Physical Exam  amb bf nad  10/28/2014       139  Wt Readings from Last 3 Encounters:  10/03/14 141 lb 14.4 oz (64.365 kg)  09/29/14 143 lb (64.864 kg)  08/26/14 142 lb 2 oz (64.467 kg)    Vital signs reviewed  HEENT:  nl dentition, turbinates, and orophanx. Nl external ear canals without cough reflex   NECK :  without JVD/Nodes/TM/ nl carotid upstrokes bilaterally   LUNGS: no acc muscle use, late bilateral exp rhonchi/ cough on exp    CV:  RRR  no s3 or murmur or increase in P2, no edema   ABD:  soft and nontender with nl excursion in the supine position. No bruits or organomegaly, bowel sounds nl  MS:  warm without deformities, calf tenderness, cyanosis or clubbing  SKIN: warm and dry without lesions    NEURO:  alert, approp, no deficits   I personally reviewed images and agree with radiology impression as follows:  CXR:   10/28/2014     No active infiltrate or effusion is seen. Mediastinal and hilar contours are unremarkable. The heart is within upper limits of normal. No acute bony abnormality is seen   Labs ordered 10/28/14 :  Allergy profile      Assessment & Plan:

## 2014-10-28 NOTE — Progress Notes (Signed)
Quick Note:  LMTCB ______ 

## 2014-10-29 ENCOUNTER — Encounter: Payer: Self-pay | Admitting: Internal Medicine

## 2014-10-29 LAB — ALLERGY FULL PROFILE
Allergen, D pternoyssinus,d7: <0.1 kU/L
Allergen,Goose feathers, e70: <0.1 kU/L
Alternaria Alternata: 0.14 kU/L — ABNORMAL HIGH
Bahia Grass: <0.1 kU/L
Bermuda Grass: <0.1 kU/L
Box Elder IgE: <0.1 kU/L
Cat Dander: <0.1 kU/L
Common Ragweed: <0.1 kU/L
D. farinae: <0.1 kU/L
Dog Dander: <0.1 kU/L
Elm IgE: <0.1 kU/L
Fescue: <0.1 kU/L
G005 Rye, Perennial: <0.1 kU/L
G009 Red Top: <0.1 kU/L
Helminthosporium halodes: <0.1 kU/L
House Dust Hollister: <0.1 kU/L
IgE (Immunoglobulin E), Serum: 83 kU/L (ref <115)
Lamb's Quarters: <0.1 kU/L
Oak: <0.1 kU/L
Plantain: <0.1 kU/L

## 2014-10-29 NOTE — Progress Notes (Signed)
Quick Note:  Spoke with pt and notified of results per Dr. Wert. Pt verbalized understanding and denied any questions.  ______ 

## 2014-10-29 NOTE — Assessment & Plan Note (Addendum)
The most common causes of chronic cough in immunocompetent adults include the following: upper airway cough syndrome (UACS), previously referred to as postnasal drip syndrome (PNDS), which is caused by variety of rhinosinus conditions; (2) asthma; (3) GERD; (4) chronic bronchitis from cigarette smoking or other inhaled environmental irritants; (5) nonasthmatic eosinophilic bronchitis; and (6) bronchiectasis.   These conditions, singly or in combination, have accounted for up to 94% of the causes of chronic cough in prospective studies.   Other conditions have constituted no >6% of the causes in prospective studies These have included bronchogenic carcinoma, chronic interstitial pneumonia, sarcoidosis, left ventricular failure, ACEI-induced cough, and aspiration from a condition associated with pharyngeal dysfunction.    Chronic cough is often simultaneously caused by more than one condition. A single cause has been found from 38 to 82% of the time, multiple causes from 18 to 62%. Multiply caused cough has been the result of three diseases up to 42% of the time.       I though this was uacs but I believe now more likely cough variant asthma very difficult to control s steroids  DDX of  difficult airways management all start with A and  include Adherence, Ace Inhibitors, Acid Reflux, Active Sinus Disease, Alpha 1 Antitripsin deficiency, Anxiety masquerading as Airways dz,  ABPA,  allergy(esp in young), Aspiration (esp in elderly), Adverse effects of meds,  Active smokers, A bunch of PE's (a small clot burden can't cause this syndrome unless there is already severe underlying pulm or vascular dz with poor reserve) plus two Bs  = Bronchiectasis and Beta blocker use..and one C= CHF  Adherence is always the initial "prime suspect" and is a multilayered concern that requires a "trust but verify" approach in every patient - starting with knowing how to use medications, especially inhalers, correctly, keeping  up with refills and understanding the fundamental difference between maintenance and prns vs those medications only taken for a very short course and then stopped and not refilled.  -The proper method of use, as well as anticipated side effects, of a metered-dose inhaler are discussed and demonstrated to the patient. Improved effectiveness after extensive coaching during this visit to a level of approximately  75% from a baseline on 25% so needs a lot of work  ? Acid (or non-acid) GERD > always difficult to exclude as up to 75% of pts in some series report no assoc GI/ Heartburn symptoms> rec continue max (24h)  acid suppression and diet restrictions/ reviewed     ? Active sinus Dz > neg sinus ct  ? Allergy > - Allergy profile 10/28/14 >  Eos 1.5 and IgE 83 pos RAST mold only so may try singulair next   ? abpa > unlikely with IgE so low   I had an extended discussion with the patient reviewing all relevant studies completed to date and  lasting 15 to 20 minutes of a 25 minute visit    Each maintenance medication was reviewed in detail including most importantly the difference between maintenance and prns and under what circumstances the prns are to be triggered using an action plan format that is not reflected in the computer generated alphabetically organized AVS.    Please see instructions for details which were reviewed in writing and the patient given a copy highlighting the part that I personally wrote and discussed at today's ov.

## 2014-11-11 ENCOUNTER — Other Ambulatory Visit: Payer: Self-pay | Admitting: *Deleted

## 2014-11-11 MED ORDER — LOVASTATIN 20 MG PO TABS: 20.0000 mg | ORAL_TABLET | Freq: Every day | ORAL | Status: DC

## 2014-11-11 MED ORDER — GLIPIZIDE 5 MG PO TABS: ORAL_TABLET | ORAL | Status: DC

## 2014-11-11 MED ORDER — HYDROCHLOROTHIAZIDE 25 MG PO TABS: 25.0000 mg | ORAL_TABLET | Freq: Two times a day (BID) | ORAL | Status: DC

## 2014-11-12 ENCOUNTER — Other Ambulatory Visit: Payer: Self-pay | Admitting: *Deleted

## 2014-11-12 MED ORDER — LOVASTATIN 20 MG PO TABS: 20.0000 mg | ORAL_TABLET | Freq: Every day | ORAL | Status: DC

## 2014-11-12 MED ORDER — HYDROCHLOROTHIAZIDE 25 MG PO TABS: 25.0000 mg | ORAL_TABLET | Freq: Two times a day (BID) | ORAL | Status: DC

## 2014-11-12 MED ORDER — GLIPIZIDE 5 MG PO TABS: ORAL_TABLET | ORAL | Status: DC

## 2014-11-24 ENCOUNTER — Other Ambulatory Visit: Payer: Self-pay | Admitting: Family Medicine

## 2014-11-24 ENCOUNTER — Ambulatory Visit (INDEPENDENT_AMBULATORY_CARE_PROVIDER_SITE_OTHER): Payer: Medicare Other | Admitting: Adult Health

## 2014-11-24 ENCOUNTER — Encounter: Payer: Self-pay | Admitting: Adult Health

## 2014-11-24 VITALS — BP 138/88 | HR 60 | Temp 98.1°F | Ht 62.0 in | Wt 138.0 lb

## 2014-11-24 DIAGNOSIS — J45991 Cough variant asthma: Secondary | ICD-10-CM

## 2014-11-24 DIAGNOSIS — R05 Cough: Secondary | ICD-10-CM | POA: Diagnosis not present

## 2014-11-24 DIAGNOSIS — R058 Other specified cough: Secondary | ICD-10-CM

## 2014-11-24 MED ORDER — MOMETASONE FURO-FORMOTEROL FUM 100-5 MCG/ACT IN AERO: INHALATION_SPRAY | RESPIRATORY_TRACT | Status: DC

## 2014-11-24 NOTE — Progress Notes (Signed)
Chart and office note reviewed in detail  > agree with a/p as outlined    

## 2014-11-24 NOTE — Assessment & Plan Note (Signed)
Improved off ACE  

## 2014-11-24 NOTE — Progress Notes (Signed)
Subjective:    Patient ID: Bianca Arroyo, female    DOB: [birth year],    MRN: [id number]  Brief patient profile:   89 yobf never smoker TB age 65 months with year round rhinitis= sneezing / runny eyes/nose  as far back as can she can remember but no sob/cough or wheeze with sensation of something stuck in throat while living in [place] around 2006 eval by ENT dx reflux rx helped > then started coughing fall 2015 and persistent ever since so referred to pulmonary clinic  10/03/2014   by Dr Baltazar Apo    History of Present Illness  10/03/2014 1st [facility] office visit/ Wert   Chief Complaint  Patient presents with  . Pulmonary Consult    Referred by Dr. Baltazar Apo. Pt c/o cough x 6-8 months-prod with clear to brown/green sputum.    indolent onset daily cough fall of 2015  worse when lie down at hs present all day long worse with talking and sometimes eating. On abx at ov 5/10 still some green / urinates from coughing/ no vomiting  Off lisinopril x one month > cozar  No sob unless coughing/ no better on flonase/ saba   rec Stop losartan  Start diovan 160 mg one daily in place  Pantoprazole (protonix) 40 mg   Take  30-60 min before first meal of the day and Pepcid (famotidine)  20 mg one @  Bedtime Prednisone 10 mg take  4 each am x 2 days,   2 each am x 2 days,  1 each am x 2 days and stop   Take delsym two tsp every 12 hours and supplement if needed with  tramadol 50 mg up to 2 every 4 hours to suppress the urge to cough. Swallowing water or using ice chips/non mint and menthol containing candies (such as lifesavers or sugarless jolly ranchers) are also effective.  You should rest your voice and avoid activities that you know make you cough. Once you have eliminated the cough for 3 straight days try reducing the tramadol first,  then the delsym as tolerated.   GERD diet  If not better >>>next step sinus CT >  Neg      10/28/2014 f/u ov/Wert re: cough since fall 2015  daily /  Did not bring   meds   Chief Complaint  Patient presents with  . Follow-up    Pt states that her cough is worse since her last visit- still bringing up brown to green sputum. She states now she has HA and "ears popping".  now sob even if not coughing and ? Better with saba but didn't bring it with her  Usually much better p prednisone, the only thing that helps/ then gradually worse cough and sob  and increase need for saba / worse at hs and early am but not usually awakening her Very poor hfa  >>augmentin x 10 d and pred taper   11/24/2014 Follow up : Cough /Chronic Rhinitis/Sinusitis  Patient returns for a 4 week follow-up and medication review. We reviewed all her medications into a medication calendar with patient education. Appears that she is taking her medications correctly. Last visit. Patient was given a ten-day course of Augmentin and prednisone taper for suspected sinus infection. Recent CT sinus showed no acute infection, but diffuse  chronic sinus disease. Chest x-ray last visit showed no acute process. Allergy profile showed an IgE at 83, and minimally positive allergens w/ mold only.  CBC showed elevated  eos.at 1.5  She is feeling better but has lingering nasal congestion and cough. She is a never smoker. She is on Dulera twice daily. Previously on ACE inhibitor. Does feel that her cough is some better since stopping lisinopril.      Current Medications, Allergies, Complete Past Medical History, Past Surgical History, Family History, and Social History were reviewed in [employer] record.  ROS  The following are not active complaints unless bolded sore throat, dysphagia, dental problems, itching, sneezing,  nasal congestion or excess/ purulent secretions,    fever, chills, sweats, unintended wt loss, classically pleuritic or exertional cp, hemoptysis,  orthopnea pnd or leg swelling, presyncope, palpitations, abdominal pain, anorexia, nausea,  vomiting, diarrhea  or change in bowel or bladder habits, change in stools or urine, dysuria,hematuria,  rash, arthralgias, visual complaints,, numbness, weakness or ataxia or problems with walking or coordination,  change in mood/affect or memory.          Objective:   Physical Exam  amb bf nad  10/28/2014       139  >>138 11/24/2014     Vital signs reviewed  HEENT: nl dentition, turbinates, and orophanx. Nl external ear canals without cough reflex   NECK :  without JVD/Nodes/TM/ nl carotid upstrokes bilaterally   LUNGS: no acc muscle use, Decreased BS in bases   CV:  RRR  no s3 or murmur or increase in P2, no edema   ABD:  soft and nontender with nl excursion in the supine position. No bruits or organomegaly, bowel sounds nl  MS:  warm without deformities, calf tenderness, cyanosis or clubbing  SKIN: warm and dry without lesions    NEURO:  alert, approp, no deficits     CXR:   10/28/2014     No active infiltrate or effusion is seen. Mediastinal and hilar contours are unremarkable. The heart is within upper limits of normal. No acute bony abnormality is seen   Labs ordered 10/28/14 :  Allergy profile  IgE 83, RAST min pos      Assessment & Plan:

## 2014-11-24 NOTE — Assessment & Plan Note (Signed)
Recent flare with suspected triggers of sinus disease. Patient is improved after anabiotic and prednisone. Cough is improved off of ACE inhibitor. Patient is to return back in 4 weeks with a pulmonary function tests and consideration of repeat CBC Patient's medications were reviewed today and patient education was given. Computerized medication calendar was adjusted/completed   Plan  Follow med calendar closely and bring to each visit.  follow up Dr. Melvyn Novas  In 4 weeks with PFT and As needed

## 2014-11-24 NOTE — Patient Instructions (Addendum)
Follow med calendar closely and bring to each visit.  follow up Dr. Melvyn Novas  In 4 weeks with PFT and As needed

## 2014-11-24 NOTE — Addendum Note (Signed)
Addended by: Osa Craver on: 11/24/2014 05:37 PM   Modules accepted: Orders, Medications

## 2014-11-28 ENCOUNTER — Other Ambulatory Visit: Payer: Self-pay | Admitting: Family Medicine

## 2014-12-17 ENCOUNTER — Telehealth: Payer: Self-pay | Admitting: Family Medicine

## 2014-12-17 ENCOUNTER — Ambulatory Visit (INDEPENDENT_AMBULATORY_CARE_PROVIDER_SITE_OTHER): Payer: Medicare Other | Admitting: Family Medicine

## 2014-12-17 ENCOUNTER — Encounter: Payer: Self-pay | Admitting: Family Medicine

## 2014-12-17 VITALS — BP 158/92 | Ht 62.0 in | Wt 140.0 lb

## 2014-12-17 DIAGNOSIS — R05 Cough: Secondary | ICD-10-CM | POA: Diagnosis not present

## 2014-12-17 DIAGNOSIS — Z79899 Other long term (current) drug therapy: Secondary | ICD-10-CM | POA: Diagnosis not present

## 2014-12-17 DIAGNOSIS — J310 Chronic rhinitis: Secondary | ICD-10-CM

## 2014-12-17 DIAGNOSIS — R053 Chronic cough: Secondary | ICD-10-CM

## 2014-12-17 DIAGNOSIS — E785 Hyperlipidemia, unspecified: Secondary | ICD-10-CM | POA: Diagnosis not present

## 2014-12-17 DIAGNOSIS — E119 Type 2 diabetes mellitus without complications: Secondary | ICD-10-CM

## 2014-12-17 DIAGNOSIS — I10 Essential (primary) hypertension: Secondary | ICD-10-CM

## 2014-12-17 LAB — POCT GLYCOSYLATED HEMOGLOBIN (HGB A1C): Hemoglobin A1C: 6.1

## 2014-12-17 NOTE — Telephone Encounter (Signed)
Pt calling to see if you were going to go ahead an put a referral to the ENT for her

## 2014-12-17 NOTE — Telephone Encounter (Signed)
Patient informed that Dr.Steve was putting in referral and the referral coordinator will be contacting her with appointment information.

## 2014-12-17 NOTE — Progress Notes (Signed)
   Subjective:    Patient ID: Bianca Arroyo, female    DOB: [birth year], 64 y.o.   MRN: [id number] Patient arrives office with multiple concerns. Diabetes She presents for her follow-up diabetic visit. She has type 2 diabetes mellitus. Current diabetic treatment includes oral agent (monotherapy). She is compliant with treatment all of the time. She is following a generally healthy diet. Exercise: exercises twice weekly. (90- 130) She does not see a podiatrist.Eye exam is current (jan 2016).   Cough is sig better with changes in the bp meds. Compliant with the new medicines. Takes all her blood pressure medicine the morning. Next  Continues to have substantial congestion stuffiness. Still wonders if it ENT consultant how fixer concerns. She would prefer not to have to take allergy medications and sterilely nasal spray for ever.  Results for orders placed or performed in visit on 12/17/14  POCT glycosylated hemoglobin (Hb A1C)  Result Value Ref Range   Hemoglobin A1C 6.1     Pt states no concerns today.   Compliant with lipid medicine. No obvious side effects. Has cut down fats in the diet.  Review of Systems No headache no chest pain no back pain abdominal pain no change in bowel habits    Objective:   Physical Exam  Alert vital stable HET moderate his congestion for a stone neck supple. Lungs clear. Heart regular in rhythm. Ankles without edema.      Assessment & Plan:  Impression 1 type 2 diabetes good control. #2 hypertension suboptimal in control discussed #3 chronic rhinitis and congestion with frustration on part of patient #4 chronic cough improved. #5 hyperlipidemia status uncertain plan check appropriate blood work. Diet exercise discussed. Maintain same meds. ENT referral. Follow-up as scheduled. WSL

## 2014-12-19 DIAGNOSIS — E785 Hyperlipidemia, unspecified: Secondary | ICD-10-CM | POA: Diagnosis not present

## 2014-12-19 DIAGNOSIS — Z79899 Other long term (current) drug therapy: Secondary | ICD-10-CM | POA: Diagnosis not present

## 2014-12-20 LAB — HEPATIC FUNCTION PANEL
ALT: 13 IU/L (ref 0–32)
AST: 18 IU/L (ref 0–40)
Albumin: 4.2 g/dL (ref 3.6–4.8)
Alkaline Phosphatase: 88 IU/L (ref 39–117)
BILIRUBIN TOTAL: 0.4 mg/dL (ref 0.0–1.2)
BILIRUBIN, DIRECT: 0.13 mg/dL (ref 0.00–0.40)
TOTAL PROTEIN: 7.1 g/dL (ref 6.0–8.5)

## 2014-12-20 LAB — LIPID PANEL
CHOL/HDL RATIO: 1.9 ratio (ref 0.0–4.4)
Cholesterol, Total: 160 mg/dL (ref 100–199)
HDL: 83 mg/dL (ref >39)
LDL Calculated: 66 mg/dL (ref 0–99)
Triglycerides: 53 mg/dL (ref 0–149)
VLDL Cholesterol Cal: 11 mg/dL (ref 5–40)

## 2014-12-22 ENCOUNTER — Encounter: Payer: Self-pay | Admitting: Family Medicine

## 2014-12-24 ENCOUNTER — Encounter: Payer: Self-pay | Admitting: Family Medicine

## 2014-12-30 ENCOUNTER — Ambulatory Visit: Payer: Medicare Other | Admitting: Internal Medicine

## 2015-01-06 ENCOUNTER — Encounter: Payer: Self-pay | Admitting: Internal Medicine

## 2015-01-06 ENCOUNTER — Ambulatory Visit (INDEPENDENT_AMBULATORY_CARE_PROVIDER_SITE_OTHER): Payer: Medicare Other | Admitting: Internal Medicine

## 2015-01-06 VITALS — BP 118/74 | HR 96 | Ht 62.0 in | Wt 141.0 lb

## 2015-01-06 DIAGNOSIS — R05 Cough: Secondary | ICD-10-CM

## 2015-01-06 DIAGNOSIS — J45991 Cough variant asthma: Secondary | ICD-10-CM

## 2015-01-06 DIAGNOSIS — R058 Other specified cough: Secondary | ICD-10-CM

## 2015-01-06 DIAGNOSIS — Z23 Encounter for immunization: Secondary | ICD-10-CM | POA: Diagnosis not present

## 2015-01-06 LAB — PULMONARY FUNCTION TEST
DL/VA % PRED: 110 %
DL/VA: 5.02 ml/min/mmHg/L
DLCO unc % pred: 77 %
DLCO unc: 16.74 ml/min/mmHg
FEF 25-75 POST: 1.24 L/s
FEF 25-75 Pre: 0.67 L/sec
FEF2575-%CHANGE-POST: 85 %
FEF2575-%PRED-POST: 71 %
FEF2575-%Pred-Pre: 38 %
FEV1-%CHANGE-POST: 22 %
FEV1-%PRED-PRE: 72 %
FEV1-%Pred-Post: 89 %
FEV1-POST: 1.58 L
FEV1-Pre: 1.29 L
FEV1FVC-%CHANGE-POST: 17 %
FEV1FVC-%Pred-Pre: 80 %
FEV6-%Change-Post: 4 %
FEV6-%Pred-Post: 97 %
FEV6-%Pred-Pre: 93 %
FEV6-PRE: 2.05 L
FEV6-Post: 2.14 L
FEV6FVC-%Change-Post: 0 %
FEV6FVC-%PRED-PRE: 104 %
FEV6FVC-%Pred-Post: 104 %
FVC-%CHANGE-POST: 4 %
FVC-%PRED-POST: 93 %
FVC-%PRED-PRE: 89 %
FVC-PRE: 2.05 L
FVC-Post: 2.14 L
POST FEV1/FVC RATIO: 74 %
PRE FEV6/FVC RATIO: 100 %
Post FEV6/FVC ratio: 100 %
Pre FEV1/FVC ratio: 63 %
RV % PRED: 91 %
RV: 1.82 L
TLC % pred: 81 %
TLC: 3.89 L

## 2015-01-06 NOTE — Patient Instructions (Addendum)
Dulera 100 Take 2 puffs first thing in am and then another 2 puffs about 12 hours later - this should completely normalize your breathing   Work on inhaler technique:  relax and gently blow all the way out then take a nice smooth deep breath back in, triggering the inhaler at same time you start breathing in.  Hold for up to 5 seconds if you can. Blow out thru nose. Rinse and gargle with water when done  Change flonase to where you take it twice daily and point it toward your ear on the same side  Let Dr Benjamine Mola know you pulmonary eval, labs and sinus ct are in [facility]    Each maintenance medication was reviewed in detail including most importantly the difference between maintenance and as needed and under what circumstances the prns are to be used.  Please see instructions for details which were reviewed in writing and the patient given a copy.    See calendar for specific medication instructions and bring it back for each and every office visit for every healthcare provider you see.  Without it,  you may not receive the best quality medical care that we feel you deserve.  You will note that the calendar groups together  your maintenance  medications that are timed at particular times of the day.  Think of this as your checklist for what your doctor has instructed you to do until your next evaluation to see what benefit  there is  to staying on a consistent group of medications intended to keep you well.  The other group at the bottom is entirely up to you to use as you see fit  for specific symptoms that may arise between visits that require you to treat them on an as needed basis.  Think of this as your action plan or "what if" list.   Separating the top medications from the bottom group is fundamental to providing you adequate care going forward.    Late add: f/u in 3 m

## 2015-01-06 NOTE — Progress Notes (Signed)
Subjective:    Patient ID: Bianca Arroyo, female    DOB: Aug 22, 1950,    MRN: [id number]  Brief patient profile:   12 yobf never smoker TB age 65 months with year round rhinitis= sneezing / runny eyes/nose  as far back as can she can remember but no sob/cough or wheeze with sensation of something stuck in throat while living in [place] around 2006 eval by ENT dx reflux rx helped > then started coughing fall 2015 and persistent ever since so referred to pulmonary clinic  10/03/2014   by Dr Baltazar Apo with pfts c/w asthma 01/06/2015    History of Present Illness  10/03/2014 1st [facility] office visit/ [facility]   Chief Complaint  Patient presents with  . Pulmonary Consult    Referred by Dr. Baltazar Apo. Pt c/o cough x 6-8 months-prod with clear to brown/green sputum.    indolent onset daily cough fall of 2015  worse when lie down at hs present all day long worse with talking and sometimes eating. On abx at ov 5/10 still some green / urinates from coughing/ no vomiting  Off lisinopril x one month > cozar  No sob unless coughing/ no better on flonase/ saba   rec Stop losartan  Start diovan 160 mg one daily in place  Pantoprazole (protonix) 40 mg   Take  30-60 min before first meal of the day and Pepcid (famotidine)  20 mg one @  Bedtime Prednisone 10 mg take  4 each am x 2 days,   2 each am x 2 days,  1 each am x 2 days and stop   Take delsym two tsp every 12 hours and supplement if needed with  tramadol 50 mg up to 2 every 4 hours to suppress the urge to cough. Swallowing water or using ice chips/non mint and menthol containing candies (such as lifesavers or sugarless jolly ranchers) are also effective.  You should rest your voice and avoid activities that you know make you cough. Once you have eliminated the cough for 3 straight days try reducing the tramadol first,  then the delsym as tolerated.   GERD diet  If not better >>>next step sinus CT >  Neg      10/28/2014 f/u  ov/[facility] re: cough since fall 2015 daily /  Did not bring   meds   Chief Complaint  Patient presents with  . Follow-up    Pt states that her cough is worse since her last visit- still bringing up brown to green sputum. She states now she has HA and "ears popping".  now sob even if not coughing and ? Better with saba but didn't bring it with her  Usually much better p prednisone, the only thing that helps/ then gradually worse cough and sob  and increase need for saba / worse at hs and early am but not usually awakening her Very poor hfa  >>augmentin x 10 d and pred taper   11/24/2014 NP  Follow up : Cough /Chronic Rhinitis/Sinusitis  Patient returns for a 4 week follow-up and medication review. We reviewed all her medications into a medication calendar with patient education. Appears that she is taking her medications correctly. Last visit. Patient was given a ten-day course of Augmentin and prednisone taper for suspected sinus infection. Recent CT sinus showed no acute infection, but diffuse  chronic sinus disease. Chest x-ray last visit showed no acute process. Allergy profile showed an IgE at 83, and minimally positive allergens  w/ mold only.  CBC showed elevated eos.at 1.5  She is feeling better but has lingering nasal congestion and cough. She is a never smoker. She is on Dulera twice daily. Previously on ACE inhibitor. Does feel that her cough is some better since stopping lisinopril.   01/06/2015 f/u ov/[facility] re: cough variant asthma ? Underlying sinus dz / using med cal well Chief Complaint  Patient presents with  . Follow-up    PFT done today. Pt states her cough has improved some. She occ prod clear sputum. She is using rescue inhaler 2 x per wk on average.      Nasal congestion main cc now > to See Teoh w/in one week Not limited by breathing from desired activities     No obvious day to day or daytime variability or assoc chronic cough or cp or chest tightness, subjective  wheeze or   hb symptoms. No unusual exp hx or h/o childhood pna/ asthma or knowledge of premature birth.  Sleeping ok without nocturnal  or early am exacerbation  of respiratory  c/o's or need for noct saba. Also denies any obvious fluctuation of symptoms with weather or environmental changes or other aggravating or alleviating factors except as outlined above   Current Medications, Allergies, Complete Past Medical History, Past Surgical History, Family History, and Social History were reviewed in [employer] record.  ROS  The following are not active complaints unless bolded sore throat, dysphagia, dental problems, itching, sneezing,  nasal congestion or excess/ purulent secretions, ear ache,   fever, chills, sweats, unintended wt loss, classically pleuritic or exertional cp, hemoptysis,  orthopnea pnd or leg swelling, presyncope, palpitations, abdominal pain, anorexia, nausea, vomiting, diarrhea  or change in bowel or bladder habits, change in stools or urine, dysuria,hematuria,  rash, arthralgias, visual complaints, headache, numbness, weakness or ataxia or problems with walking or coordination,  change in mood/affect or memory.                      Objective:   Physical Exam  amb bf nad nasal tone to voice   10/28/2014       139  >>138 11/24/2014 > 01/06/2015  141     Vital signs reviewed  HEENT: nl dentition, turbinates, and orophanx. Nl external ear canals without cough reflex   NECK :  without JVD/Nodes/TM/ nl carotid upstrokes bilaterally   LUNGS: no acc muscle use, clear bilaterally to A and P   CV:  RRR  no s3 or murmur or increase in P2, no edema   ABD:  soft and nontender with nl excursion in the supine position. No bruits or organomegaly, bowel sounds nl  MS:  warm without deformities, calf tenderness, cyanosis or clubbing  SKIN: warm and dry without lesions    NEURO:  alert, approp, no deficits           Assessment & Plan:

## 2015-01-06 NOTE — Progress Notes (Signed)
PFT done today. 

## 2015-01-07 ENCOUNTER — Encounter: Payer: Self-pay | Admitting: Internal Medicine

## 2015-01-07 ENCOUNTER — Encounter: Payer: Self-pay | Admitting: Nurse Practitioner

## 2015-01-07 NOTE — Assessment & Plan Note (Addendum)
-   10/28/2014 p extensive coaching HFA effectiveness =    25% > try sample of dulera 100 2bid > improved  - Allergy profile 10/28/14 >  Eos 1.5 and IgE 83 pos RAST mold only  11/24/2014 Med calendar   - PFT's  01/06/2015  FEV1 1.58 (89 % ) ratio 74  p 22 % improvement from saba with DLCO  77 % corrects to 110 % for alv volume    Socially clearly has cough variant asthma that can be completely normalized with albuterol and should do well on a maintenance regimen consisting of Dulera 100 3 puffs every 12 hours.  The proper method of use, as well as anticipated side effects, of a metered-dose inhaler are discussed and demonstrated to the patient. Improved effectiveness after extensive coaching during this visit to a level of approximately  75% so still needs to work on this  I had an extended discussion with the patient reviewing all relevant studies completed to date and  lasting 15 to 20 minutes of a 25 minute visit    Each maintenance medication was reviewed in detail including most importantly the difference between maintenance and prns and under what circumstances the prns are to be triggered using an action plan format that is not reflected in the computer generated alphabetically organized AVS.    Please see instructions for details which were reviewed in writing and the patient given a copy highlighting the part that I personally wrote and discussed at today's ov.

## 2015-01-07 NOTE — Assessment & Plan Note (Addendum)
-   try change losartan to valsartan 10/03/2014  - Sinus CT  10/14/2014 > chronic changes only   Follow-up with ENT is planned. In the meantime she should try to use the Flonase 1 puff twice daily pointed toward the ear on the same side to get maximum delivery to the target tissue.

## 2015-01-13 DIAGNOSIS — H6983 Other specified disorders of Eustachian tube, bilateral: Secondary | ICD-10-CM | POA: Diagnosis not present

## 2015-01-13 DIAGNOSIS — J322 Chronic ethmoidal sinusitis: Secondary | ICD-10-CM | POA: Diagnosis not present

## 2015-01-13 DIAGNOSIS — J320 Chronic maxillary sinusitis: Secondary | ICD-10-CM | POA: Diagnosis not present

## 2015-01-13 DIAGNOSIS — J321 Chronic frontal sinusitis: Secondary | ICD-10-CM | POA: Diagnosis not present

## 2015-01-13 DIAGNOSIS — J323 Chronic sphenoidal sinusitis: Secondary | ICD-10-CM | POA: Diagnosis not present

## 2015-02-04 DIAGNOSIS — J310 Chronic rhinitis: Secondary | ICD-10-CM | POA: Diagnosis not present

## 2015-02-04 DIAGNOSIS — J320 Chronic maxillary sinusitis: Secondary | ICD-10-CM | POA: Diagnosis not present

## 2015-02-04 DIAGNOSIS — J343 Hypertrophy of nasal turbinates: Secondary | ICD-10-CM | POA: Diagnosis not present

## 2015-02-04 DIAGNOSIS — J342 Deviated nasal septum: Secondary | ICD-10-CM | POA: Diagnosis not present

## 2015-02-23 ENCOUNTER — Ambulatory Visit (INDEPENDENT_AMBULATORY_CARE_PROVIDER_SITE_OTHER): Payer: Medicare Other | Admitting: Family Medicine

## 2015-02-23 ENCOUNTER — Encounter: Payer: Self-pay | Admitting: Family Medicine

## 2015-02-23 VITALS — BP 148/82 | Ht 62.0 in | Wt 142.1 lb

## 2015-02-23 DIAGNOSIS — Z0000 Encounter for general adult medical examination without abnormal findings: Secondary | ICD-10-CM | POA: Diagnosis not present

## 2015-02-23 DIAGNOSIS — Z23 Encounter for immunization: Secondary | ICD-10-CM | POA: Diagnosis not present

## 2015-02-23 NOTE — Progress Notes (Signed)
   Subjective:    Patient ID: Bianca Arroyo, female    DOB: 01-Aug-1950, 64 y.o.   MRN: [id number]  HPI AWV- Annual Wellness Visit 2015 up to speed  Re g exdrcise b minus, still stys active and walks a bit   The patient was seen for their annual wellness visit. The patient's past medical history, surgical history, and family history were reviewed. Pertinent vaccines were reviewed ( tetanus, pneumonia, shingles, flu) The patient's medication list was reviewed and updated.  The height and weight were entered. The patient's current BMI is:  Cognitive screening was completed. Outcome of Mini - Cog:   Falls within the past 6 months:None  Current tobacco usage: None (All patients who use tobacco were given written and verbal information on quitting)  Recent listing of emergency department/hospitalizations over the past year were reviewed.  current specialist the patient sees on a regular basis: Yes, Dr. Benjamine Mola    Medicare annual wellness visit patient questionnaire was reviewed.  A written screening schedule for the patient for the next 5-10 years was given. Appropriate discussion of followup regarding next visit was discussed.   Patient states no other concerns this visit. Last Pap smear done in August 2014 with normal results.   Review of Systems  Constitutional: Negative for activity change, appetite change and fatigue.  HENT: Negative for congestion, ear discharge and rhinorrhea.   Eyes: Negative for discharge.  Respiratory: Negative for cough, chest tightness and wheezing.   Cardiovascular: Negative for chest pain.  Gastrointestinal: Negative for vomiting and abdominal pain.  Genitourinary: Negative for frequency and difficulty urinating.  Musculoskeletal: Negative for neck pain.  Allergic/Immunologic: Negative for environmental allergies and food allergies.  Neurological: Negative for weakness and headaches.  Psychiatric/Behavioral: Negative for behavioral problems  and agitation.  All other systems reviewed and are negative.      Objective:   Physical Exam  Constitutional: She is oriented to person, place, and time. She appears well-developed and well-nourished.  HENT:  Head: Normocephalic.  Right Ear: External ear normal.  Left Ear: External ear normal.  Eyes: Pupils are equal, round, and reactive to light.  Neck: Normal range of motion. No thyromegaly present.  Cardiovascular: Normal rate, regular rhythm, normal heart sounds and intact distal pulses.   No murmur heard. Pulmonary/Chest: Effort normal and breath sounds normal. No respiratory distress. She has no wheezes.  Abdominal: Soft. Bowel sounds are normal. She exhibits no distension and no mass. There is no tenderness.  Genitourinary:  Pap smear not in obtain pelvic rectal exam within normal limits rationale for pap smear discussed with patient  Musculoskeletal: Normal range of motion. She exhibits no edema or tenderness.  Lymphadenopathy:    She has no cervical adenopathy.  Neurological: She is alert and oriented to person, place, and time. She exhibits normal muscle tone.  Skin: Skin is warm and dry.  Psychiatric: She has a normal mood and affect. Her behavior is normal.  Vitals reviewed.         Assessment & Plan:  Impression wellness exam up-to-date on mammogram up-to-date on colonoscopy no overt depression plan vaccines discussed Prevnar today pap smear rationale discussed diet exercise discussed follow-up next regular chronic visit [facility]

## 2015-02-27 ENCOUNTER — Other Ambulatory Visit: Payer: Self-pay | Admitting: *Deleted

## 2015-02-27 MED ORDER — FLUTICASONE PROPIONATE 50 MCG/ACT NA SUSP: 2.0000 | Freq: Every day | NASAL | Status: DC

## 2015-03-07 ENCOUNTER — Other Ambulatory Visit: Payer: Self-pay | Admitting: Family Medicine

## 2015-03-18 ENCOUNTER — Ambulatory Visit: Payer: Medicare Other | Admitting: Family Medicine

## 2015-03-25 ENCOUNTER — Ambulatory Visit: Payer: Medicare Other | Admitting: Family Medicine

## 2015-03-31 ENCOUNTER — Ambulatory Visit (INDEPENDENT_AMBULATORY_CARE_PROVIDER_SITE_OTHER): Payer: Medicare Other | Admitting: Family Medicine

## 2015-03-31 VITALS — BP 162/90 | Ht 62.0 in | Wt 145.4 lb

## 2015-03-31 DIAGNOSIS — I10 Essential (primary) hypertension: Secondary | ICD-10-CM | POA: Diagnosis not present

## 2015-03-31 DIAGNOSIS — R05 Cough: Secondary | ICD-10-CM

## 2015-03-31 DIAGNOSIS — Z1159 Encounter for screening for other viral diseases: Secondary | ICD-10-CM | POA: Diagnosis not present

## 2015-03-31 DIAGNOSIS — E785 Hyperlipidemia, unspecified: Secondary | ICD-10-CM | POA: Diagnosis not present

## 2015-03-31 DIAGNOSIS — E119 Type 2 diabetes mellitus without complications: Secondary | ICD-10-CM

## 2015-03-31 DIAGNOSIS — R5383 Other fatigue: Secondary | ICD-10-CM | POA: Diagnosis not present

## 2015-03-31 DIAGNOSIS — Z79899 Other long term (current) drug therapy: Secondary | ICD-10-CM

## 2015-03-31 DIAGNOSIS — R053 Chronic cough: Secondary | ICD-10-CM

## 2015-03-31 LAB — POCT GLYCOSYLATED HEMOGLOBIN (HGB A1C): Hemoglobin A1C: 6.7

## 2015-03-31 MED ORDER — FLUTICASONE PROPIONATE 50 MCG/ACT NA SUSP: 2.0000 | Freq: Every day | NASAL | Status: DC

## 2015-03-31 MED ORDER — PANTOPRAZOLE SODIUM 40 MG PO TBEC: 40.0000 mg | DELAYED_RELEASE_TABLET | Freq: Every day | ORAL | Status: DC

## 2015-03-31 MED ORDER — LOVASTATIN 20 MG PO TABS: ORAL_TABLET | ORAL | Status: DC

## 2015-03-31 MED ORDER — GLIPIZIDE 5 MG PO TABS: ORAL_TABLET | ORAL | Status: DC

## 2015-03-31 MED ORDER — HYDROCHLOROTHIAZIDE 25 MG PO TABS: 25.0000 mg | ORAL_TABLET | Freq: Two times a day (BID) | ORAL | Status: DC

## 2015-03-31 MED ORDER — METOPROLOL SUCCINATE ER 50 MG PO TB24: 50.0000 mg | ORAL_TABLET | Freq: Every morning | ORAL | Status: DC

## 2015-03-31 MED ORDER — VALSARTAN 160 MG PO TABS: 160.0000 mg | ORAL_TABLET | ORAL | Status: DC

## 2015-03-31 NOTE — Progress Notes (Signed)
   Subjective:    Patient ID: Bianca Arroyo, female    DOB: Sep 19, 1950, 64 y.o.   MRN: [id number]  Diabetes She presents for her follow-up diabetic visit. She has type 2 diabetes mellitus. Risk factors for coronary artery disease include dyslipidemia, hypertension and post-menopausal. Current diabetic treatment includes oral agent (monotherapy). She is compliant with treatment all of the time. Her weight is stable. She is following a diabetic diet. She has not had a previous visit with a dietitian. She does not see a podiatrist.Eye exam is current.   Results for orders placed or performed in visit on 01/06/15  Pulmonary Function Test  Result Value Ref Range   FVC-Pre 2.05 L   FVC-%Pred-Pre 89 %   FVC-Post 2.14 L   FVC-%Pred-Post 93 %   FVC-%Change-Post 4 %   FEV1-Pre 1.29 L   FEV1-%Pred-Pre 72 %   FEV1-Post 1.58 L   FEV1-%Pred-Post 89 %   FEV1-%Change-Post 22 %   FEV6-Pre 2.05 L   FEV6-%Pred-Pre 93 %   FEV6-Post 2.14 L   FEV6-%Pred-Post 97 %   FEV6-%Change-Post 4 %   Pre FEV1/FVC ratio 63 %   FEV1FVC-%Pred-Pre 80 %   Post FEV1/FVC ratio 74 %   FEV1FVC-%Change-Post 17 %   Pre FEV6/FVC Ratio 100 %   FEV6FVC-%Pred-Pre 104 %   Post FEV6/FVC ratio 100 %   FEV6FVC-%Pred-Post 104 %   FEV6FVC-%Change-Post 0 %   FEF 25-75 Pre 0.67 L/sec   FEF2575-%Pred-Pre 38 %   FEF 25-75 Post 1.24 L/sec   FEF2575-%Pred-Post 71 %   FEF2575-%Change-Post 85 %   RV 1.82 L   RV % pred 91 %   TLC 3.89 L   TLC % pred 81 %   DLCO unc 16.74 ml/min/mmHg   DLCO unc % pred 77 %   DL/VA 5.02 ml/min/mmHg/L   DL/VA % pred 110 %   Exercise coming along not as good as possible, trying to get better next  Patient notes some hair loss recently. She is concerned about low thyroid.  Patient claims compliance with blood pressure medication. Meds reviewed today. Blood pressure generally good when checked elsewhere.  Patient claims compliance with lipid medicine. Watching her fat intake. No obvious side  effects. Medications reviewed today. Old blood work is reviewed today.  Review of Systems  no headache no chest pain no back pain no abdominal pain some chronic cough but improved   Objective:   Physical Exam   alert vitals stable. HEENT normal. Lungs clear. Heart regular in rhythm. Ankles without edema diabetic foot exam within normal limits thyroid nonpalpable       Assessment & Plan:   impression 1 type 2 diabetes control good discussed maintain same meds #2 lipidemia status uncertain currently meds reviewed. Maintain same pending results #3 hypertension good control meds reviewed maintain same #4 thyroid concerns will check level, hepatitis C screening also recheck in 6 months diet exercise discussed further recommendations based results

## 2015-04-03 DIAGNOSIS — Z1159 Encounter for screening for other viral diseases: Secondary | ICD-10-CM | POA: Diagnosis not present

## 2015-04-03 DIAGNOSIS — E785 Hyperlipidemia, unspecified: Secondary | ICD-10-CM | POA: Diagnosis not present

## 2015-04-03 DIAGNOSIS — R5383 Other fatigue: Secondary | ICD-10-CM | POA: Diagnosis not present

## 2015-04-03 DIAGNOSIS — Z79899 Other long term (current) drug therapy: Secondary | ICD-10-CM | POA: Diagnosis not present

## 2015-04-04 LAB — HEPATIC FUNCTION PANEL
ALBUMIN: 4.7 g/dL (ref 3.6–4.8)
ALK PHOS: 103 IU/L (ref 39–117)
ALT: 19 IU/L (ref 0–32)
AST: 24 IU/L (ref 0–40)
BILIRUBIN, DIRECT: 0.1 mg/dL (ref 0.00–0.40)
Bilirubin Total: 0.2 mg/dL (ref 0.0–1.2)
TOTAL PROTEIN: 7.6 g/dL (ref 6.0–8.5)

## 2015-04-04 LAB — LIPID PANEL
CHOLESTEROL TOTAL: 177 mg/dL (ref 100–199)
Chol/HDL Ratio: 2.1 ratio units (ref 0.0–4.4)
HDL: 86 mg/dL (ref >39)
LDL Calculated: 73 mg/dL (ref 0–99)
Triglycerides: 88 mg/dL (ref 0–149)
VLDL CHOLESTEROL CAL: 18 mg/dL (ref 5–40)

## 2015-04-04 LAB — TSH: TSH: 0.505 u[IU]/mL (ref 0.450–4.500)

## 2015-04-04 LAB — HEPATITIS C ANTIBODY: Hep C Virus Ab: <0.1 s/co ratio (ref 0.0–0.9)

## 2015-04-05 ENCOUNTER — Encounter: Payer: Self-pay | Admitting: Family Medicine

## 2015-04-07 ENCOUNTER — Ambulatory Visit (INDEPENDENT_AMBULATORY_CARE_PROVIDER_SITE_OTHER): Payer: Medicare Other | Admitting: Internal Medicine

## 2015-04-07 ENCOUNTER — Encounter: Payer: Self-pay | Admitting: Internal Medicine

## 2015-04-07 VITALS — BP 142/90 | HR 60 | Ht 62.0 in | Wt 143.4 lb

## 2015-04-07 DIAGNOSIS — J45991 Cough variant asthma: Secondary | ICD-10-CM | POA: Diagnosis not present

## 2015-04-07 DIAGNOSIS — Z23 Encounter for immunization: Secondary | ICD-10-CM

## 2015-04-07 NOTE — Progress Notes (Signed)
Subjective:    Patient ID: Bianca Arroyo, female    DOB: Aug 22, 1950,    MRN: [id number]  Brief patient profile:   12 yobf never smoker TB age 64 months with year round rhinitis= sneezing / runny eyes/nose  as far back as can she can remember but no sob/cough or wheeze with sensation of something stuck in throat while living in [place] around 2006 eval by ENT dx reflux rx helped > then started coughing fall 2015 and persistent ever since so referred to pulmonary clinic  10/03/2014   by Dr Baltazar Apo with pfts c/w asthma 01/06/2015    History of Present Illness  10/03/2014 1st [facility] office visit/ Bianca Arroyo   Chief Complaint  Patient presents with  . Pulmonary Consult    Referred by Dr. Baltazar Apo. Pt c/o cough x 6-8 months-prod with clear to brown/green sputum.    indolent onset daily cough fall of 2015  worse when lie down at hs present all day long worse with talking and sometimes eating. On abx at ov 5/10 still some green / urinates from coughing/ no vomiting  Off lisinopril x one month > cozar  No sob unless coughing/ no better on flonase/ saba   rec Stop losartan  Start diovan 160 mg one daily in place  Pantoprazole (protonix) 40 mg   Take  30-60 min before first meal of the day and Pepcid (famotidine)  20 mg one @  Bedtime Prednisone 10 mg take  4 each am x 2 days,   2 each am x 2 days,  1 each am x 2 days and stop   Take delsym two tsp every 12 hours and supplement if needed with  tramadol 50 mg up to 2 every 4 hours to suppress the urge to cough. Swallowing water or using ice chips/non mint and menthol containing candies (such as lifesavers or sugarless jolly ranchers) are also effective.  You should rest your voice and avoid activities that you know make you cough. Once you have eliminated the cough for 3 straight days try reducing the tramadol first,  then the delsym as tolerated.   GERD diet  If not better >>>next step sinus CT >  Neg      10/28/2014 f/u  ov/Bianca Arroyo re: cough since fall 2015 daily /  Did not bring   meds   Chief Complaint  Patient presents with  . Follow-up    Pt states that her cough is worse since her last visit- still bringing up brown to green sputum. She states now she has HA and "ears popping".  now sob even if not coughing and ? Better with saba but didn't bring it with her  Usually much better p prednisone, the only thing that helps/ then gradually worse cough and sob  and increase need for saba / worse at hs and early am but not usually awakening her Very poor hfa  >>augmentin x 10 d and pred taper   11/24/2014 NP  Follow up : Cough /Chronic Rhinitis/Sinusitis  Patient returns for a 4 week follow-up and medication review. We reviewed all her medications into a medication calendar with patient education. Appears that she is taking her medications correctly. Last visit. Patient was given a ten-day course of Augmentin and prednisone taper for suspected sinus infection. Recent CT sinus showed no acute infection, but diffuse  chronic sinus disease. Chest x-ray last visit showed no acute process. Allergy profile showed an IgE at 83, and minimally positive allergens  w/ mold only.  CBC showed elevated eos.at 1.5  She is feeling better but has lingering nasal congestion and cough. She is a never smoker. She is on Dulera twice daily. Previously on ACE inhibitor. Does feel that her cough is some better since stopping lisinopril.   01/06/2015 f/u ov/Bianca Arroyo re: cough variant asthma ? Underlying sinus dz / using med cal well Chief Complaint  Patient presents with  . Follow-up    PFT done today. Pt states her cough has improved some. She occ prod clear sputum. She is using rescue inhaler 2 x per wk on average.   Nasal congestion main cc now > to See Teoh w/in one week rec Dulera 100 Take 2 puffs first thing in am and then another 2 puffs about 12 hours later - this should completely normalize your breathing  Work on inhaler  technique:  Change flonase to where you take it twice daily and point it toward your ear on the same side Follow med calendar       04/07/2015  f/u ov/Bianca Arroyo re:  Cough since fall of 2015/ no med calendar/ not keeping up with dulera rx/ notes symptoms worse when omits it from her routine Chief Complaint  Patient presents with  . Follow-up    Pt states her cough continues to improve. She uses albuterol inhaler 2-3 x per wk on average.    Not limited by breathing from desired activities     No obvious day to day or daytime variability or assoc excess/ purulent sputum or mucus plugs   or cp or chest tightness, subjective wheeze or   hb symptoms. No unusual exp hx or h/o childhood pna/ asthma or knowledge of premature birth.  Sleeping ok without nocturnal  or early am exacerbation  of respiratory  c/o's or need for noct saba. Also denies any obvious fluctuation of symptoms with weather or environmental changes or other aggravating or alleviating factors except as outlined above   Current Medications, Allergies, Complete Past Medical History, Past Surgical History, Family History, and Social History were reviewed in [employer] record.  ROS  The following are not active complaints unless bolded sore throat, dysphagia, dental problems, itching, sneezing,  nasal congestion or excess/ purulent secretions, ear ache,   fever, chills, sweats, unintended wt loss, classically pleuritic or exertional cp, hemoptysis,  orthopnea pnd or leg swelling, presyncope, palpitations, abdominal pain, anorexia, nausea, vomiting, diarrhea  or change in bowel or bladder habits, change in stools or urine, dysuria,hematuria,  rash, arthralgias, visual complaints, headache, numbness, weakness or ataxia or problems with walking or coordination,  change in mood/affect or memory.          Objective:   Physical Exam  amb bf nad    10/28/2014       139  >>138 11/24/2014 > 01/06/2015  141 > 04/07/2015   143    Vital signs reviewed  HEENT: nl dentition, turbinates, and orophanx. Nl external ear canals without cough reflex   NECK :  without JVD/Nodes/TM/ nl carotid upstrokes bilaterally   LUNGS: no acc muscle use, clear bilaterally to A and P   CV:  RRR  no s3 or murmur or increase in P2, no edema   ABD:  soft and nontender with nl excursion in the supine position. No bruits or organomegaly, bowel sounds nl  MS:  warm without deformities, calf tenderness, cyanosis or clubbing  SKIN: warm and dry without lesions    NEURO:  alert, approp, no  deficits    I personally reviewed images and agree with radiology impression as follows:  CXR:  10/28/14 No active cardiopulmonary disease.        Assessment & Plan:

## 2015-04-07 NOTE — Patient Instructions (Addendum)
Resume dulera 100 Take 2 puffs first thing in am and then another 2 puffs about 12 hours later.   Work on inhaler technique:  relax and gently blow all the way out then take a nice smooth deep breath back in, triggering the inhaler at same time you start breathing in.  Hold for up to 5 seconds if you can. Blow out thru nose. Rinse and gargle with water when done  Only use your albuterol as a rescue medication to be used if you can't catch your breath by resting or doing a relaxed purse lip breathing pattern.  - The less you use it, the better it will work when you need it. - Ok to use up to 2 puffs  every 4 hours if you must but call for immediate appointment if use goes up over your usual need - Don't leave home without it !!  (think of it like the spare tire for your car)   See calendar for specific medication instructions and bring it back for each and every office visit for every healthcare provider you see.  Without it,  you may not receive the best quality medical care that we feel you deserve.  You will note that the calendar groups together  your maintenance  medications that are timed at particular times of the day.  Think of this as your checklist for what your doctor has instructed you to do until your next evaluation to see what benefit  there is  to staying on a consistent group of medications intended to keep you well.  The other group at the bottom is entirely up to you to use as you see fit  for specific symptoms that may arise between visits that require you to treat them on an as needed basis.  Think of this as your action plan or "what if" list.   Separating the top medications from the bottom group is fundamental to providing you adequate care going forward.    Please schedule a follow up office visit in 6 weeks, call sooner if needed

## 2015-04-08 NOTE — Assessment & Plan Note (Signed)
-   10/28/2014 p extensive coaching HFA effectiveness =    25% > try sample of dulera 100 2bid > improved  - Allergy profile 10/28/14 >  Eos 1.5 and IgE 83 pos RAST mold only  11/24/2014 Med calendar    - PFT's  01/06/2015  FEV1 1.58 (89 % ) ratio 74  p 22 % improvement from saba with DLCO  77 % corrects to 110 % for alv volume   - 04/07/2015  extensive coaching HFA effectiveness =  75%   Despite the fact that she has having trouble keeping up with her instructions, her medication calendar, and optimal use of her inhaler, she is actually doing fairly well.  I had an extended discussion with the patient reviewing all relevant studies completed to date and  lasting 15 to 20 minutes of a 25 minute visit    Each maintenance medication was reviewed in detail including most importantly the difference between maintenance and prns and under what circumstances the prns are to be triggered using an action plan format that is not reflected in the computer generated alphabetically organized AVS but trather by a newly generated customized med calendar that reflects the AVS meds with confirmed 100% correlation.   Please see instructions for details which were reviewed in writing and the patient given a copy highlighting the part that I personally wrote and discussed at today's ov.

## 2015-05-05 DIAGNOSIS — J342 Deviated nasal septum: Secondary | ICD-10-CM | POA: Diagnosis not present

## 2015-05-05 DIAGNOSIS — J343 Hypertrophy of nasal turbinates: Secondary | ICD-10-CM | POA: Diagnosis not present

## 2015-05-05 DIAGNOSIS — J310 Chronic rhinitis: Secondary | ICD-10-CM | POA: Diagnosis not present

## 2015-05-07 ENCOUNTER — Other Ambulatory Visit: Payer: Self-pay | Admitting: Family Medicine

## 2015-05-20 ENCOUNTER — Ambulatory Visit: Payer: Medicare Other | Admitting: Internal Medicine

## 2015-06-30 ENCOUNTER — Other Ambulatory Visit: Payer: Self-pay | Admitting: Family Medicine

## 2015-06-30 DIAGNOSIS — E119 Type 2 diabetes mellitus without complications: Secondary | ICD-10-CM | POA: Diagnosis not present

## 2015-06-30 DIAGNOSIS — H25813 Combined forms of age-related cataract, bilateral: Secondary | ICD-10-CM | POA: Diagnosis not present

## 2015-06-30 DIAGNOSIS — H4710 Unspecified papilledema: Secondary | ICD-10-CM | POA: Diagnosis not present

## 2015-06-30 DIAGNOSIS — Z1231 Encounter for screening mammogram for malignant neoplasm of breast: Secondary | ICD-10-CM

## 2015-06-30 DIAGNOSIS — H40003 Preglaucoma, unspecified, bilateral: Secondary | ICD-10-CM | POA: Diagnosis not present

## 2015-06-30 LAB — HM DIABETES EYE EXAM

## 2015-07-03 ENCOUNTER — Ambulatory Visit (HOSPITAL_COMMUNITY)
Admission: RE | Admit: 2015-07-03 | Discharge: 2015-07-03 | Disposition: A | Discharge status: Home / Self Care | Payer: Medicare Other | Source: Ambulatory Visit | Attending: Family Medicine | Admitting: Family Medicine

## 2015-07-03 DIAGNOSIS — Z1231 Encounter for screening mammogram for malignant neoplasm of breast: Secondary | ICD-10-CM | POA: Insufficient documentation

## 2015-07-28 ENCOUNTER — Other Ambulatory Visit: Payer: Self-pay | Admitting: Family Medicine

## 2015-09-01 DIAGNOSIS — H40003 Preglaucoma, unspecified, bilateral: Secondary | ICD-10-CM | POA: Diagnosis not present

## 2015-09-18 ENCOUNTER — Ambulatory Visit (INDEPENDENT_AMBULATORY_CARE_PROVIDER_SITE_OTHER): Payer: Medicare Other | Admitting: Family Medicine

## 2015-09-18 VITALS — BP 130/80 | Ht 62.0 in | Wt 149.2 lb

## 2015-09-18 DIAGNOSIS — Z79899 Other long term (current) drug therapy: Secondary | ICD-10-CM | POA: Diagnosis not present

## 2015-09-18 DIAGNOSIS — I10 Essential (primary) hypertension: Secondary | ICD-10-CM

## 2015-09-18 DIAGNOSIS — E119 Type 2 diabetes mellitus without complications: Secondary | ICD-10-CM | POA: Diagnosis not present

## 2015-09-18 DIAGNOSIS — E785 Hyperlipidemia, unspecified: Secondary | ICD-10-CM

## 2015-09-18 LAB — POCT GLYCOSYLATED HEMOGLOBIN (HGB A1C): Hemoglobin A1C: 6.6

## 2015-09-18 MED ORDER — HYDROCHLOROTHIAZIDE 25 MG PO TABS: 25.0000 mg | ORAL_TABLET | Freq: Two times a day (BID) | ORAL | Status: DC

## 2015-09-18 MED ORDER — GLIPIZIDE 5 MG PO TABS: ORAL_TABLET | ORAL | Status: DC

## 2015-09-18 MED ORDER — LOVASTATIN 20 MG PO TABS: ORAL_TABLET | ORAL | Status: DC

## 2015-09-18 MED ORDER — METOPROLOL SUCCINATE ER 50 MG PO TB24: 50.0000 mg | ORAL_TABLET | Freq: Every morning | ORAL | Status: DC

## 2015-09-18 MED ORDER — PANTOPRAZOLE SODIUM 40 MG PO TBEC: 40.0000 mg | DELAYED_RELEASE_TABLET | Freq: Every day | ORAL | Status: DC

## 2015-09-18 MED ORDER — VALSARTAN 320 MG PO TABS: 320.0000 mg | ORAL_TABLET | Freq: Every day | ORAL | Status: DC

## 2015-09-18 NOTE — Progress Notes (Signed)
   Subjective:    Patient ID: Bianca Arroyo, female    DOB: [birth year], 65 y.o.   MRN: [id number]  Diabetes She presents for her follow-up diabetic visit. She has type 2 diabetes mellitus. Risk factors for coronary artery disease include diabetes mellitus, dyslipidemia and hypertension. Current diabetic treatment includes oral agent (monotherapy). She is compliant with treatment all of the time. Her weight is stable. She is following a diabetic diet. She has not had a previous visit with a dietitian. She does not see a podiatrist.Eye exam is current.   Glu 130 to 100  Results for orders placed or performed in visit on 07/13/15  HM DIABETES EYE EXAM  Result Value Ref Range   HM Diabetic Eye Exam No Retinopathy No Retinopathy   Results for orders placed or performed in visit on 07/13/15  HM DIABETES EYE EXAM  Result Value Ref Range   HM Diabetic Eye Exam No Retinopathy No Retinopathy   Results for orders placed or performed in visit on 09/18/15  POCT glycosylated hemoglobin (Hb A1C)  Result Value Ref Range   Hemoglobin A1C 6.6     No blood work since  Three d per wk of cardio classes, trying to stick with plus dancing   BP numb, generally good when checked elsewhere. Compliant with blood pressure medications. No obvious side effects. Handling the medications well.  Compliant with lipid medicine. No recent blood work. Old blood work reviewed.Patient trying to work on fats in her diet.  Review of Systems No headache, no major weight loss or weight gain, no chest pain no back pain abdominal pain no change in bowel habits complete ROS otherwise negative     Objective:   Physical Exam  Alert vitals stable. HEENT normal lungs clear. Heartregular in rhythm. Ankles without edema.      Assessment & Plan:  Impression 1 type 2 diabetes good control maintain same dose #2 hypertension good control maintain same dose meds compliance discussed #3 hyperlipidemia. Exact status uncertain.  Old blood work reviewed to maintain same dose maintain until further results plan all medications refilled. Diet exercise discussed. Appropriate blood work further recommendations based results WSL

## 2015-09-23 DIAGNOSIS — E785 Hyperlipidemia, unspecified: Secondary | ICD-10-CM | POA: Diagnosis not present

## 2015-09-23 DIAGNOSIS — Z79899 Other long term (current) drug therapy: Secondary | ICD-10-CM | POA: Diagnosis not present

## 2015-09-24 LAB — LIPID PANEL
Chol/HDL Ratio: 2.1 ratio units (ref 0.0–4.4)
Cholesterol, Total: 164 mg/dL (ref 100–199)
HDL: 77 mg/dL (ref >39)
LDL CALC: 76 mg/dL (ref 0–99)
Triglycerides: 54 mg/dL (ref 0–149)
VLDL CHOLESTEROL CAL: 11 mg/dL (ref 5–40)

## 2015-09-24 LAB — HEPATIC FUNCTION PANEL
ALBUMIN: 4.4 g/dL (ref 3.6–4.8)
ALT: 13 IU/L (ref 0–32)
AST: 17 IU/L (ref 0–40)
Alkaline Phosphatase: 79 IU/L (ref 39–117)
BILIRUBIN TOTAL: 0.5 mg/dL (ref 0.0–1.2)
Bilirubin, Direct: 0.16 mg/dL (ref 0.00–0.40)
TOTAL PROTEIN: 7.3 g/dL (ref 6.0–8.5)

## 2015-09-27 ENCOUNTER — Encounter: Payer: Self-pay | Admitting: Family Medicine

## 2015-11-05 ENCOUNTER — Ambulatory Visit (INDEPENDENT_AMBULATORY_CARE_PROVIDER_SITE_OTHER): Payer: Medicare Other | Admitting: Otolaryngology

## 2015-11-05 DIAGNOSIS — J342 Deviated nasal septum: Secondary | ICD-10-CM | POA: Diagnosis not present

## 2015-11-05 DIAGNOSIS — J343 Hypertrophy of nasal turbinates: Secondary | ICD-10-CM | POA: Diagnosis not present

## 2015-11-05 DIAGNOSIS — J310 Chronic rhinitis: Secondary | ICD-10-CM | POA: Diagnosis not present

## 2015-11-06 ENCOUNTER — Other Ambulatory Visit: Payer: Self-pay | Admitting: Family Medicine

## 2015-12-04 ENCOUNTER — Other Ambulatory Visit: Payer: Self-pay | Admitting: Family Medicine

## 2015-12-28 ENCOUNTER — Telehealth: Payer: Self-pay | Admitting: Family Medicine

## 2015-12-28 NOTE — Telephone Encounter (Signed)
Need more info, where in [place] , how long, rural or stay in the [place] etc.

## 2015-12-28 NOTE — Telephone Encounter (Signed)
Patient is going to [place] on February 11, 2016 and she has been recommend that she get Rx for typhoid capsules.  [facility]

## 2015-12-29 NOTE — Telephone Encounter (Signed)
Patient is going to [place], [place] in [place], staying 8 days and this is the [place]. Wants to know about immunizations also

## 2015-12-29 NOTE — Telephone Encounter (Signed)
Left message on voicemail return call  

## 2016-01-04 NOTE — Telephone Encounter (Signed)
Pt called wanting to know if Dr. Richardson Landry has had a chance to research what she needs for this trip. Please advise.

## 2016-01-07 NOTE — Telephone Encounter (Signed)
[facility] 01/07/16

## 2016-01-07 NOTE — Telephone Encounter (Signed)
Spoke with patient and informed her per Dr.Steve Luking- the good news is that malaria does not exist in that area of [place] and neither does yellow fever. If she had started this process many months ago I would have recommended hepatitis A immuniz but too late for that. There is a rare chance of a disease called typhus, if patient wants that it is  A costly oral antibiotic to take soon, but if I were going to [place] alone, I would be careful what I eat and drink and not take the oral tyhus because of potential side effects. Patient verbalized understanding and stated that she was told she could give the hep A at [place] as long as it was 30 days apart. Also patient would like to get the antibiotic for typhus. Please advise?

## 2016-01-07 NOTE — Telephone Encounter (Signed)
Call pt and tell her I see she has already started to do a lot of vaccines, the good news is that nmalaria does not exist in that area of [place] and neither does yellow fever. If she had started this process many months ago I would have recommended hepatitis A immuniz but too late for that. There is a rare chan e of a deisease called typhus, if pt wants that it is  A costly oral antibiotic to take soon, but if I were going to [place] alone, I would be careful what I eat and drink and not take the oral tyhus because of potential s e;'s see what pt thinks

## 2016-01-08 MED ORDER — TYPHOID VACCINE PO CPDR: DELAYED_RELEASE_CAPSULE | ORAL | 0 refills | Status: DC

## 2016-01-08 NOTE — Telephone Encounter (Addendum)
Typhoid medication is not covered by patient's insurance and will be an out of pocket expense per insurance

## 2016-01-08 NOTE — Telephone Encounter (Signed)
Left message to return call 

## 2016-01-08 NOTE — Telephone Encounter (Signed)
Rx: Four live typhoid capsules, prescribe, take on day1 3 5 and 7 one wk before departure, hep A vaccines less effective over age 66 and less effective this close to departure.Pt has received other immuniz I would normally not recommed but that is her choice and whoever told her to get it

## 2016-01-08 NOTE — Telephone Encounter (Signed)
Left message return call 01/08/16. (Typhoid capsules sent into pharmacy)

## 2016-01-11 DIAGNOSIS — H40003 Preglaucoma, unspecified, bilateral: Secondary | ICD-10-CM | POA: Diagnosis not present

## 2016-01-11 NOTE — Telephone Encounter (Signed)
Left message to return call 

## 2016-01-12 DIAGNOSIS — N760 Acute vaginitis: Secondary | ICD-10-CM | POA: Diagnosis not present

## 2016-01-12 DIAGNOSIS — R300 Dysuria: Secondary | ICD-10-CM | POA: Diagnosis not present

## 2016-01-12 NOTE — Telephone Encounter (Signed)
Advised patient Rx: Four live typhoid capsules, prescribe, take on day1 3 5 and 7 one wk before departure, hep A vaccines less effective over age 66 and less effective this close to departure.Pt has received other immunizions Dr Richardson Landry would normally not recommed but that is her choice and whoever told her to get it. Patient also notified that insurance denied the Typhoid medication and it will be an out of pocket cost but Dr Richardson Landry does recommend the medication. Patient verbalized understanding.

## 2016-02-02 ENCOUNTER — Other Ambulatory Visit: Payer: Self-pay | Admitting: Family Medicine

## 2016-03-14 ENCOUNTER — Telehealth: Payer: Self-pay | Admitting: Family Medicine

## 2016-03-14 DIAGNOSIS — I10 Essential (primary) hypertension: Secondary | ICD-10-CM | POA: Diagnosis not present

## 2016-03-14 DIAGNOSIS — Z79899 Other long term (current) drug therapy: Secondary | ICD-10-CM | POA: Diagnosis not present

## 2016-03-14 DIAGNOSIS — E119 Type 2 diabetes mellitus without complications: Secondary | ICD-10-CM

## 2016-03-14 DIAGNOSIS — E785 Hyperlipidemia, unspecified: Secondary | ICD-10-CM

## 2016-03-14 NOTE — Telephone Encounter (Signed)
Labs ready. Pt notified.

## 2016-03-14 NOTE — Telephone Encounter (Signed)
Lip liv m7 A1c urine protein analysius

## 2016-03-14 NOTE — Telephone Encounter (Signed)
Pt called requesting lab orders to be sent over for an upcoming appt. Last labs per epic were: hepatic,lipid,and poct on 09/18/15

## 2016-03-15 LAB — LIPID PANEL
CHOL/HDL RATIO: 2.6 ratio (ref 0.0–4.4)
Cholesterol, Total: 175 mg/dL (ref 100–199)
HDL: 68 mg/dL (ref >39)
LDL Calculated: 83 mg/dL (ref 0–99)
Triglycerides: 122 mg/dL (ref 0–149)
VLDL CHOLESTEROL CAL: 24 mg/dL (ref 5–40)

## 2016-03-15 LAB — BASIC METABOLIC PANEL
BUN / CREAT RATIO: 14 (ref 12–28)
BUN: 12 mg/dL (ref 8–27)
CHLORIDE: 99 mmol/L (ref 96–106)
CO2: 27 mmol/L (ref 18–29)
Calcium: 9.1 mg/dL (ref 8.7–10.3)
Creatinine, Ser: 0.84 mg/dL (ref 0.57–1.00)
GFR calc non Af Amer: 73 mL/min/{1.73_m2} (ref >59)
GFR, EST AFRICAN AMERICAN: 84 mL/min/{1.73_m2} (ref >59)
GLUCOSE: 123 mg/dL — AB (ref 65–99)
POTASSIUM: 3.5 mmol/L (ref 3.5–5.2)
SODIUM: 146 mmol/L — AB (ref 134–144)

## 2016-03-15 LAB — HEPATIC FUNCTION PANEL
ALBUMIN: 4.4 g/dL (ref 3.6–4.8)
ALK PHOS: 79 IU/L (ref 39–117)
ALT: 15 IU/L (ref 0–32)
AST: 23 IU/L (ref 0–40)
Bilirubin Total: 0.2 mg/dL (ref 0.0–1.2)
Bilirubin, Direct: 0.09 mg/dL (ref 0.00–0.40)
TOTAL PROTEIN: 7.4 g/dL (ref 6.0–8.5)

## 2016-03-15 LAB — MICROALBUMIN / CREATININE URINE RATIO
Creatinine, Urine: 127.1 mg/dL
MICROALB/CREAT RATIO: 37.7 mg/g{creat} — AB (ref 0.0–30.0)
MICROALBUM., U, RANDOM: 47.9 ug/mL

## 2016-03-15 LAB — HEMOGLOBIN A1C
ESTIMATED AVERAGE GLUCOSE: 171 mg/dL
HEMOGLOBIN A1C: 7.6 % — AB (ref 4.8–5.6)

## 2016-03-18 ENCOUNTER — Encounter: Payer: Self-pay | Admitting: Family Medicine

## 2016-03-18 ENCOUNTER — Ambulatory Visit (INDEPENDENT_AMBULATORY_CARE_PROVIDER_SITE_OTHER): Payer: Medicare Other | Admitting: Family Medicine

## 2016-03-18 VITALS — BP 140/76 | Ht 62.0 in | Wt 150.2 lb

## 2016-03-18 DIAGNOSIS — E785 Hyperlipidemia, unspecified: Secondary | ICD-10-CM

## 2016-03-18 DIAGNOSIS — E119 Type 2 diabetes mellitus without complications: Secondary | ICD-10-CM | POA: Diagnosis not present

## 2016-03-18 DIAGNOSIS — I10 Essential (primary) hypertension: Secondary | ICD-10-CM | POA: Diagnosis not present

## 2016-03-18 MED ORDER — HYDROCHLOROTHIAZIDE 25 MG PO TABS: 25.0000 mg | ORAL_TABLET | Freq: Two times a day (BID) | ORAL | 1 refills | Status: DC

## 2016-03-18 MED ORDER — VALSARTAN 320 MG PO TABS: 320.0000 mg | ORAL_TABLET | Freq: Every day | ORAL | 1 refills | Status: DC

## 2016-03-18 MED ORDER — GLIPIZIDE 5 MG PO TABS: 5.0000 mg | ORAL_TABLET | Freq: Two times a day (BID) | ORAL | 1 refills | Status: DC

## 2016-03-18 MED ORDER — PANTOPRAZOLE SODIUM 40 MG PO TBEC: 40.0000 mg | DELAYED_RELEASE_TABLET | Freq: Every day | ORAL | 1 refills | Status: DC

## 2016-03-18 MED ORDER — LOVASTATIN 20 MG PO TABS: ORAL_TABLET | ORAL | 1 refills | Status: DC

## 2016-03-18 MED ORDER — METOPROLOL SUCCINATE ER 100 MG PO TB24: 100.0000 mg | ORAL_TABLET | Freq: Every day | ORAL | 1 refills | Status: DC

## 2016-03-18 NOTE — Progress Notes (Signed)
Subjective:    Patient ID: Bianca Arroyo, female    DOB: 07/01/1950, 65 y.o.   MRN: [id number]  Diabetes  She presents for her follow-up diabetic visit. She has type 2 diabetes mellitus. There are no hypoglycemic associated symptoms. There are no diabetic associated symptoms. There are no hypoglycemic complications. There are no diabetic complications. There are no known risk factors for coronary artery disease. Current diabetic treatment includes oral agent (monotherapy). She is compliant with treatment all of the time.  Patient states that she has no concerns today.   Last eye exam- 06/30/15  Results for orders placed or performed in visit on 03/14/16  Lipid panel  Result Value Ref Range   Cholesterol, Total 175 100 - 199 mg/dL   Triglycerides 122 0 - 149 mg/dL   HDL 68 >39 mg/dL   VLDL Cholesterol Cal 24 5 - 40 mg/dL   LDL Calculated 83 0 - 99 mg/dL   Chol/HDL Ratio 2.6 0.0 - 4.4 ratio units  Hepatic function panel  Result Value Ref Range   Total Protein 7.4 6.0 - 8.5 g/dL   Albumin 4.4 3.6 - 4.8 g/dL   Bilirubin Total 0.2 0.0 - 1.2 mg/dL   Bilirubin, Direct 0.09 0.00 - 0.40 mg/dL   Alkaline Phosphatase 79 39 - 117 IU/L   AST 23 0 - 40 IU/L   ALT 15 0 - 32 IU/L  Hemoglobin A1c  Result Value Ref Range   Hgb A1c MFr Bld 7.6 (H) 4.8 - 5.6 %   Est. average glucose Bld gHb Est-mCnc 171 mg/dL  Microalbumin / creatinine urine ratio  Result Value Ref Range   Creatinine, Urine 127.1 Not Estab. mg/dL   Microalbum.,U,Random 47.9 Not Estab. ug/mL   Microalb/Creat Ratio 37.7 (H) 0.0 - 30.0 mg/g creat  Basic metabolic panel  Result Value Ref Range   Glucose 123 (H) 65 - 99 mg/dL   BUN 12 8 - 27 mg/dL   Creatinine, Ser 0.84 0.57 - 1.00 mg/dL   GFR calc non Af Amer 73 >59 mL/min/1.73   GFR calc Af Amer 84 >59 mL/min/1.73   BUN/Creatinine Ratio 14 12 - 28   Sodium 146 (H) 134 - 144 mmol/L   Potassium 3.5 3.5 - 5.2 mmol/L   Chloride 99 96 - 106 mmol/L   CO2 27 18 - 29 mmol/L   Calcium 9.1 8.7 - 10.3 mg/dL   Diet so so, not the best   Reg exercise and cardio class five times per wk  BP drifting up soe lately  Blood pressure medicine and blood pressure levels reviewed today with patient. Compliant with blood pressure medicine. States does not miss a dose. No obvious side effects. Blood pressure generally good when checked elsewhere. Watching salt intake.   Patient continues to take lipid medication regularly. No obvious side effects from it. Generally does not miss a dose. Prior blood work results are reviewed with patient. Patient continues to work on fat intake in diet  Patient continues meds for chronic rhinitis and overall does well with them. Review of Systems .RS No headache, no major weight loss or weight gain, no chest pain no back pain abdominal pain no change in bowel habits complete ROS otherwise negative     Objective:   Physical Exam  Alert vitals stable, NAD. Blood pressurenot  good on repeat. HEENT normal. Lungs clear. Heart regular rate and rhythm. Ankles without edema feet sensation intact pulses good thank you impression #1  Assessment & Plan:  Impression #1 type 2 diabetes not good control, discussed. Need to increase Glucotrol to 1 twice a day. #2 hypertension suboptimal in control discussed with systolic mid 0000000 to [id number] need increase medicine increase Toprol 1500 XL #3 hyperlipidemia good control continue same treatment #4 chronic rhinitis good control plan flu shot [place] given. Medications adjusted as noted diet exercise discussed recheck in 6 months WSL

## 2016-03-22 ENCOUNTER — Ambulatory Visit: Payer: Medicare Other | Admitting: Family Medicine

## 2016-03-25 ENCOUNTER — Other Ambulatory Visit: Payer: Self-pay

## 2016-03-25 ENCOUNTER — Other Ambulatory Visit: Payer: Self-pay | Admitting: Family Medicine

## 2016-05-26 ENCOUNTER — Other Ambulatory Visit: Payer: Self-pay | Admitting: Family Medicine

## 2016-05-26 DIAGNOSIS — Z1231 Encounter for screening mammogram for malignant neoplasm of breast: Secondary | ICD-10-CM

## 2016-06-11 ENCOUNTER — Other Ambulatory Visit: Payer: Self-pay | Admitting: Family Medicine

## 2016-06-28 ENCOUNTER — Emergency Department (HOSPITAL_COMMUNITY)
Admission: EM | Admit: 2016-06-28 | Discharge: 2016-06-28 | Disposition: A | Discharge status: Home / Self Care | Payer: Medicare Other | Attending: Emergency Medicine | Admitting: Emergency Medicine

## 2016-06-28 ENCOUNTER — Encounter (HOSPITAL_COMMUNITY): Payer: Self-pay | Admitting: Emergency Medicine

## 2016-06-28 ENCOUNTER — Emergency Department (HOSPITAL_COMMUNITY): Payer: Medicare Other

## 2016-06-28 ENCOUNTER — Other Ambulatory Visit: Payer: Self-pay | Admitting: Family Medicine

## 2016-06-28 DIAGNOSIS — M5431 Sciatica, right side: Secondary | ICD-10-CM | POA: Insufficient documentation

## 2016-06-28 DIAGNOSIS — M25561 Pain in right knee: Secondary | ICD-10-CM | POA: Diagnosis not present

## 2016-06-28 DIAGNOSIS — I10 Essential (primary) hypertension: Secondary | ICD-10-CM | POA: Insufficient documentation

## 2016-06-28 DIAGNOSIS — Z7984 Long term (current) use of oral hypoglycemic drugs: Secondary | ICD-10-CM | POA: Insufficient documentation

## 2016-06-28 DIAGNOSIS — Z79899 Other long term (current) drug therapy: Secondary | ICD-10-CM | POA: Diagnosis not present

## 2016-06-28 DIAGNOSIS — Z7982 Long term (current) use of aspirin: Secondary | ICD-10-CM | POA: Insufficient documentation

## 2016-06-28 DIAGNOSIS — M25551 Pain in right hip: Secondary | ICD-10-CM | POA: Diagnosis present

## 2016-06-28 DIAGNOSIS — E119 Type 2 diabetes mellitus without complications: Secondary | ICD-10-CM | POA: Diagnosis not present

## 2016-06-28 MED ORDER — IBUPROFEN 600 MG PO TABS: 600.0000 mg | ORAL_TABLET | Freq: Three times a day (TID) | ORAL | 0 refills | Status: DC | PRN

## 2016-06-28 MED ORDER — IBUPROFEN 400 MG PO TABS
400.0000 mg | ORAL_TABLET | Freq: Once | ORAL | Status: AC
  Administered 2016-06-28: 400 mg via ORAL
  Filled 2016-06-28: qty 1

## 2016-06-28 NOTE — Discharge Instructions (Signed)
I encourage heat therapy 20 minutes 3-4 times daily to your knee and buttock area which may help with pain.  Your xray shows very mild arthritis in your knee which amy be the source of your pain.  I believe you also have sciatica nerve pain causing the upper thigh pain.  Continue using your tylenol and also add the ibuprofen as prescribed.

## 2016-06-28 NOTE — ED Provider Notes (Signed)
[facility] DEPT Provider Note   CSN: [id number] Arrival date & time: 06/28/16  1238     History   Chief Complaint Chief Complaint  Patient presents with  . Hip Pain    HPI Bianca Arroyo is a 66 y.o. female with a history as outlined below, significant for Diabetes, last cbg checked yesterday at 130 and also arthritis in her bilateral hands presenting with a 5 day history of right knee and hip pain, describing knee pain that radiates into her lateral thigh and sometimes into the posterior thigh near her buttock, but is currently just in the knee. Her pain is worsened with movement after rest, then slowly improves. She denies any injuries but does endorse occasional popping and clicking in the knee joint.  She has taken tylenol for her pain which has been helpful.  She denies weakness or numbness in her leg and has had no urinary or bowel incontinence or retention.   The history is provided by the patient.    Past Medical History:  Diagnosis Date  . Diabetes mellitus without complication ([place])   . Hyperlipidemia   . Hypertension     Patient Active Problem List   Diagnosis Date Noted  . Cough variant asthma 10/28/2014  . Upper airway cough syndrome 10/03/2014  . Left lateral epicondylitis 12/17/2013  . Allergic rhinitis 12/17/2013  . Gout 02/25/2013  . Diabetes ([place]) 08/23/2012  . Essential hypertension, benign 08/23/2012  . Hyperlipidemia LDL goal <100 08/23/2012    Past Surgical History:  Procedure Laterality Date  . CESAREAN SECTION    . COLONOSCOPY    . COLONOSCOPY N/A 12/06/2013   Procedure: COLONOSCOPY;  Surgeon: Danie Binder, MD;  Location: [facility] SUITE;  Service: Endoscopy;  Laterality: N/A;  9:30 AM  . Left wrist surgery    . Right trigger thumb  2013    OB History    Gravida Para Term Preterm AB Living   2 2 2     2    SAB TAB Ectopic Multiple Live Births                   Home Medications    Prior to Admission medications   Medication  Sig Start Date End Date Taking? Authorizing Provider  albuterol (PROVENTIL HFA;VENTOLIN HFA) 108 (90 BASE) MCG/ACT inhaler Inhale 2 puffs into the lungs every 6 (six) hours as needed for wheezing or shortness of breath. Patient taking differently: Inhale 2 puffs into the lungs every 4 (four) hours as needed for wheezing or shortness of breath.  09/29/14  Yes Mikey Kirschner, MD  aspirin 81 MG chewable tablet Chew 81 mg by mouth every morning.    Yes Historical Provider, MD  fluticasone (FLONASE) 50 MCG/ACT nasal spray Place 2 sprays into both nostrils daily. 03/31/15  Yes Mikey Kirschner, MD  glipiZIDE (GLUCOTROL) 5 MG tablet Take 1 tablet (5 mg total) by mouth 2 (two) times daily before a meal. 03/18/16  Yes Mikey Kirschner, MD  hydrochlorothiazide (HYDRODIURIL) 25 MG tablet Take 1 tablet (25 mg total) by mouth 2 (two) times daily. 03/18/16  Yes Mikey Kirschner, MD  ipratropium (ATROVENT) 0.06 % nasal spray Place 1-2 sprays into the nose daily as needed. 06/15/16  Yes Historical Provider, MD  lovastatin (MEVACOR) 20 MG tablet TAKE 1 TABLET (20 MG TOTAL) BY MOUTH DAILY. Patient taking differently: Take 20 mg by mouth at bedtime.  03/18/16  Yes Mikey Kirschner, MD  metoprolol succinate (TOPROL-XL) 100  MG 24 hr tablet Take 1 tablet (100 mg total) by mouth daily. Take with or immediately following a meal. 03/18/16  Yes Mikey Kirschner, MD  Multiple Vitamins-Minerals (ALIVE ONCE DAILY WOMENS 50+ PO) Take 1 tablet by mouth every morning.    Yes Historical Provider, MD  Tetrahydrozoline HCl (VISINE OP) Apply 1 drop to eye daily as needed (dry eye relief).    Yes Historical Provider, MD  valACYclovir (VALTREX) 500 MG tablet TAKE ONE TABLET BY MOUTH TWICE DAILY FOR 3 DAYS AS NEEDED FOR FLARES 02/03/16  Yes Mikey Kirschner, MD  valsartan (DIOVAN) 320 MG tablet Take 1 tablet (320 mg total) by mouth daily. 03/18/16  Yes Mikey Kirschner, MD  vitamin B-12 (CYANOCOBALAMIN) 1000 MCG tablet Take 1,000 mcg by mouth  every morning.   Yes Historical Provider, MD  ibuprofen (ADVIL,MOTRIN) 600 MG tablet Take 1 tablet (600 mg total) by mouth every 8 (eight) hours as needed. 06/28/16   Evalee Jefferson, PA-C    Family History Family History  Problem Relation Age of Onset  . Diabetes Father   . Hypertension Father   . Colon cancer Neg Hx     Social History Social History  Substance Use Topics  . Smoking status: Never Smoker  . Smokeless tobacco: Never Used  . Alcohol use 1.5 oz/week    3 Standard drinks or equivalent per week     Allergies   Colcrys [colchicine]   Review of Systems Review of Systems  Constitutional: Negative for fever.  Gastrointestinal: Negative.   Genitourinary: Negative.   Musculoskeletal: Positive for arthralgias. Negative for joint swelling and myalgias.  Skin: Negative for color change.  Neurological: Negative for weakness and numbness.     Physical Exam Updated Vital Signs BP 177/89 (BP Location: Left Arm)   Pulse 60   Temp 98.9 F (37.2 C) (Oral)   Resp 18   Ht 5\' 2"  (1.575 m)   Wt 68 kg   SpO2 100%   BMI 27.44 kg/m   Physical Exam  Constitutional: She appears well-developed and well-nourished.  HENT:  Head: Atraumatic.  Neck: Normal range of motion.  Cardiovascular:  Pulses:      Dorsalis pedis pulses are 2+ on the right side.  Pulses equal bilaterally  Musculoskeletal: She exhibits tenderness.       Right hip: She exhibits normal range of motion, no bony tenderness, no swelling and no crepitus.       Right knee: She exhibits abnormal patellar mobility. She exhibits no swelling, no effusion, no erythema, no LCL laxity and no MCL laxity.  No pain with right hip internal/external rotation.  ttp bilateral knee meniscus, mild crepitus with patella movement.  Right thigh soft, nontender. ttp right sciatic notch without radicular pain.  nontender over right hip bursa.  Neurological: She is alert. She has normal strength. She displays normal reflexes. No  sensory deficit.  Skin: Skin is warm and dry.  Psychiatric: She has a normal mood and affect.     ED Treatments / Results  Labs (all labs ordered are listed, but only abnormal results are displayed) Labs Reviewed - No data to display  EKG  EKG Interpretation None       Radiology Dg Knee Complete 4 Views Right  Result Date: 06/28/2016 CLINICAL DATA:  Sharp pain. EXAM: RIGHT KNEE - COMPLETE 4+ VIEW COMPARISON:  No recent. FINDINGS: No acute bony or joint abnormality identified. No evidence of fracture or dislocation. IMPRESSION: No acute abnormality. Electronically Signed  By: Branford   On: 06/28/2016 16:54    Procedures Procedures (including critical care time)  Medications Ordered in ED Medications  ibuprofen (ADVIL,MOTRIN) tablet 400 mg (400 mg Oral Given 06/28/16 1634)     Initial Impression / Assessment and Plan / ED Course  I have reviewed the triage vital signs and the nursing notes.  Pertinent labs & imaging results that were available during my care of the patient were reviewed by me and considered in my medical decision making (see chart for details).     Musculoskeletal pain, with component of sciatica, mild arthritis on right knee films.  She was encouraged heat tx, can alternate tylenol, added ibuprofen q 8 hours. Prn f/u with pcp if sx persist or worsen.   Final Clinical Impressions(s) / ED Diagnoses   Final diagnoses:  Sciatica of right side  Acute pain of right knee    New Prescriptions New Prescriptions   IBUPROFEN (ADVIL,MOTRIN) 600 MG TABLET    Take 1 tablet (600 mg total) by mouth every 8 (eight) hours as needed.     Evalee Jefferson, PA-C 06/28/16 Luana, MD 06/29/16 [phone]

## 2016-06-28 NOTE — ED Triage Notes (Addendum)
Patient c/o pain in right hip that radiates into right thigh and knee. Per patient started Friday. Taking tylenol with no relief. Denies any injury. Patient reports hx of sciatic pain.

## 2016-07-03 ENCOUNTER — Encounter: Payer: Self-pay | Admitting: Family Medicine

## 2016-07-04 NOTE — Telephone Encounter (Signed)
Bianca Arroyo spoke with patient and patient scheduled an appointment for tomorrow.

## 2016-07-05 ENCOUNTER — Encounter: Payer: Self-pay | Admitting: Family Medicine

## 2016-07-05 ENCOUNTER — Ambulatory Visit (INDEPENDENT_AMBULATORY_CARE_PROVIDER_SITE_OTHER): Payer: Medicare Other | Admitting: Family Medicine

## 2016-07-05 VITALS — BP 132/86 | Ht 62.0 in | Wt 152.2 lb

## 2016-07-05 DIAGNOSIS — M25561 Pain in right knee: Secondary | ICD-10-CM | POA: Diagnosis not present

## 2016-07-05 NOTE — Progress Notes (Signed)
   Subjective:    Patient ID: Bianca Arroyo, female    DOB: [birth year], 66 y.o.   MRN: [id number]  HPI Patient arrives for a follow up from recent Er visit for right leg pain.He was diagnosed at that time as having potential sciatica. Notes ongoing pain now focused mainly in the knee. Notes intermittent swelling in the knee. Likes to dance a lot  Pain cenertered in the knee  Aching at times worse at night   Pain fairly sig,  None below the knee  No sig prob    Review of Systems No headache, no major weight loss or weight gain, no chest pain no back pain abdominal pain no change in bowel habits complete ROS otherwise negative     Objective:   Physical Exam  Alert vitals stable, NAD. Blood pressure good on repeat. HEENT normal. Lungs clear. Heart regular rate and rhythm. Negative straight leg raise evident positive right knee effusion and crepitations      Assessment & Plan:  Impression flare of right knee pain with likely element of degenerative changes. This is not sciatica discussed with patient plan anti-inflammatory medicine plus regular exercise

## 2016-07-07 ENCOUNTER — Ambulatory Visit (HOSPITAL_COMMUNITY)
Admission: RE | Admit: 2016-07-07 | Discharge: 2016-07-07 | Disposition: A | Discharge status: Home / Self Care | Payer: Medicare Other | Source: Ambulatory Visit | Attending: Family Medicine | Admitting: Family Medicine

## 2016-07-07 DIAGNOSIS — Z1231 Encounter for screening mammogram for malignant neoplasm of breast: Secondary | ICD-10-CM | POA: Diagnosis not present

## 2016-07-13 ENCOUNTER — Other Ambulatory Visit (HOSPITAL_COMMUNITY): Payer: Self-pay | Admitting: Internal Medicine

## 2016-07-13 DIAGNOSIS — Z1231 Encounter for screening mammogram for malignant neoplasm of breast: Secondary | ICD-10-CM

## 2016-07-15 ENCOUNTER — Ambulatory Visit (HOSPITAL_COMMUNITY): Payer: Medicare Other

## 2016-07-16 ENCOUNTER — Other Ambulatory Visit: Payer: Self-pay | Admitting: Family Medicine

## 2016-07-25 DIAGNOSIS — H25813 Combined forms of age-related cataract, bilateral: Secondary | ICD-10-CM | POA: Diagnosis not present

## 2016-07-25 DIAGNOSIS — H43812 Vitreous degeneration, left eye: Secondary | ICD-10-CM | POA: Diagnosis not present

## 2016-07-25 DIAGNOSIS — H40003 Preglaucoma, unspecified, bilateral: Secondary | ICD-10-CM | POA: Diagnosis not present

## 2016-07-25 DIAGNOSIS — H4710 Unspecified papilledema: Secondary | ICD-10-CM | POA: Diagnosis not present

## 2016-07-25 LAB — HM DIABETES EYE EXAM

## 2016-08-01 DIAGNOSIS — Q078 Other specified congenital malformations of nervous system: Secondary | ICD-10-CM | POA: Diagnosis not present

## 2016-08-02 ENCOUNTER — Encounter: Payer: Self-pay | Admitting: *Deleted

## 2016-09-16 ENCOUNTER — Ambulatory Visit (INDEPENDENT_AMBULATORY_CARE_PROVIDER_SITE_OTHER): Payer: Medicare Other | Admitting: Family Medicine

## 2016-09-16 VITALS — BP 142/86 | Ht 62.0 in | Wt 149.2 lb

## 2016-09-16 DIAGNOSIS — E119 Type 2 diabetes mellitus without complications: Secondary | ICD-10-CM

## 2016-09-16 DIAGNOSIS — E785 Hyperlipidemia, unspecified: Secondary | ICD-10-CM | POA: Diagnosis not present

## 2016-09-16 DIAGNOSIS — I10 Essential (primary) hypertension: Secondary | ICD-10-CM | POA: Diagnosis not present

## 2016-09-16 LAB — POCT GLYCOSYLATED HEMOGLOBIN (HGB A1C): Hemoglobin A1C: 5.9

## 2016-09-16 MED ORDER — LOVASTATIN 20 MG PO TABS: ORAL_TABLET | ORAL | 1 refills | Status: DC

## 2016-09-16 MED ORDER — VALSARTAN 320 MG PO TABS: 320.0000 mg | ORAL_TABLET | Freq: Every day | ORAL | 1 refills | Status: DC

## 2016-09-16 MED ORDER — HYDROCHLOROTHIAZIDE 25 MG PO TABS: 25.0000 mg | ORAL_TABLET | Freq: Two times a day (BID) | ORAL | 1 refills | Status: DC

## 2016-09-16 MED ORDER — GLIPIZIDE 5 MG PO TABS: 5.0000 mg | ORAL_TABLET | Freq: Two times a day (BID) | ORAL | 1 refills | Status: DC

## 2016-09-16 MED ORDER — METOPROLOL SUCCINATE ER 100 MG PO TB24: 100.0000 mg | ORAL_TABLET | Freq: Every day | ORAL | 1 refills | Status: DC

## 2016-09-16 NOTE — Progress Notes (Signed)
   Subjective:    Patient ID: Bianca Arroyo, female    DOB: 23-Nov-1950, 66 y.o.   MRN: [id number]  Diabetes  She presents for her follow-up diabetic visit. She has type 2 diabetes mellitus. Risk factors for coronary artery disease include diabetes mellitus, dyslipidemia, hypertension and post-menopausal. Current diabetic treatment includes oral agent (monotherapy). She is compliant with treatment all of the time. Her weight is stable. She is following a diabetic diet. She has not had a previous visit with a dietitian. She does not see a podiatrist.Eye exam is current.   Results for orders placed or performed in visit on 09/16/16  POCT glycosylated hemoglobin (Hb A1C)  Result Value Ref Range   Hemoglobin A1C 5.9     Patient claims compliance with diabetes medication. No obvious side effects. Reports no substantial low sugar spells. Most numbers are generally in good range when checked fasting. Generally does not miss a dose of medication. Watching diabetic diet closely  Blood pressure medicine and blood pressure levels reviewed today with patient. Compliant with blood pressure medicine. States does not miss a dose. No obvious side effects. Blood pressure generally good when checked elsewhere. Watching salt intake.   glus 119 99 127  Patient continues to take lipid medication regularly. No obvious side effects from it. Generally does not miss a dose. Prior blood work results are reviewed with patient. Patient continues to work on fat intake in diet Diet overall better  Exercising reg  Comp with mes  Watching   utd on eye focs  no new med  Problems   m Review of Systems No headache, no major weight loss or weight gain, no chest pain no back pain abdominal pain no change in bowel habits complete ROS otherwise negative     Objective:   Physical Exam Alert and oriented, vitals reviewed and stable, NAD ENT-TM's and ext canals WNL bilat via otoscopic exam Soft palate, tonsils and  post pharynx WNL via oropharyngeal exam Neck-symmetric, no masses; thyroid nonpalpable and nontender Pulmonary-no tachypnea or accessory muscle use; Clear without wheezes via auscultation Card--no abnrml murmurs, rhythm reg and rate WNL Carotid pulses symmetric, without bruits        Assessment & Plan:  Impression 1 type 2 diabetes discussed good control A1c excellent compliance discussed #2 hyperlipidemia prior blood work reviewed. Medications refilled compliance discussed diet discussed #3 hypertension blood pressures good maintain same dose rationale discussed compliance discussed blood work discussed. Follow-up in 6 months [facility]

## 2016-09-17 DIAGNOSIS — E785 Hyperlipidemia, unspecified: Secondary | ICD-10-CM | POA: Diagnosis not present

## 2016-09-18 LAB — LIPID PANEL
CHOL/HDL RATIO: 2.5 ratio (ref 0.0–4.4)
Cholesterol, Total: 179 mg/dL (ref 100–199)
HDL: 72 mg/dL (ref >39)
LDL CALC: 81 mg/dL (ref 0–99)
Triglycerides: 129 mg/dL (ref 0–149)
VLDL Cholesterol Cal: 26 mg/dL (ref 5–40)

## 2016-09-18 LAB — HEPATIC FUNCTION PANEL
ALBUMIN: 4.5 g/dL (ref 3.6–4.8)
ALK PHOS: 135 IU/L — AB (ref 39–117)
ALT: 17 IU/L (ref 0–32)
AST: 24 IU/L (ref 0–40)
BILIRUBIN TOTAL: 0.3 mg/dL (ref 0.0–1.2)
BILIRUBIN, DIRECT: 0.09 mg/dL (ref 0.00–0.40)
TOTAL PROTEIN: 7.5 g/dL (ref 6.0–8.5)

## 2016-09-19 ENCOUNTER — Encounter: Payer: Self-pay | Admitting: Family Medicine

## 2016-09-24 ENCOUNTER — Other Ambulatory Visit: Payer: Self-pay | Admitting: Family Medicine

## 2016-09-26 NOTE — Telephone Encounter (Signed)
Last seen: 09/16/2016

## 2016-09-27 ENCOUNTER — Telehealth: Payer: Self-pay | Admitting: Family Medicine

## 2016-09-27 NOTE — Telephone Encounter (Signed)
We generally disc and recommedn and order this during preventive exams, I notice none don with Bianca Arroyo for several yrs, sched wellness with Bianca Arroyo

## 2016-09-27 NOTE — Telephone Encounter (Signed)
Left message to return call 

## 2016-09-27 NOTE — Telephone Encounter (Signed)
Patient is requesting to be scheduled for a Dexa scan.  She sent a request through [facility].

## 2016-09-28 NOTE — Telephone Encounter (Signed)
Discussed with pt. Pt transferrred to front to schedule wellness with carolyn.

## 2016-09-30 DIAGNOSIS — N951 Menopausal and female climacteric states: Secondary | ICD-10-CM | POA: Diagnosis not present

## 2016-10-10 DIAGNOSIS — N951 Menopausal and female climacteric states: Secondary | ICD-10-CM | POA: Diagnosis not present

## 2016-10-14 ENCOUNTER — Encounter: Payer: Self-pay | Admitting: Nurse Practitioner

## 2016-10-14 ENCOUNTER — Ambulatory Visit (INDEPENDENT_AMBULATORY_CARE_PROVIDER_SITE_OTHER): Payer: Medicare Other | Admitting: Nurse Practitioner

## 2016-10-14 VITALS — BP 138/86 | Ht 62.0 in | Wt 150.0 lb

## 2016-10-14 DIAGNOSIS — Z0000 Encounter for general adult medical examination without abnormal findings: Secondary | ICD-10-CM

## 2016-10-14 DIAGNOSIS — Z13820 Encounter for screening for osteoporosis: Secondary | ICD-10-CM | POA: Diagnosis not present

## 2016-10-14 DIAGNOSIS — Z780 Asymptomatic menopausal state: Secondary | ICD-10-CM | POA: Diagnosis not present

## 2016-10-14 NOTE — Progress Notes (Signed)
   Subjective:    Patient ID: Bianca Arroyo, female    DOB: May 17, 1950, 66 y.o.   MRN: [id number]  HPI presents for her wellness exam. No vaginal bleeding or pelvic pain. No new partner; current partner x 5 years. Regular exercise including cardio. According to patient, BP was 128/70 a week ago. Regular vision and dental exams.     Review of Systems  Constitutional: Negative for activity change, appetite change and fatigue.  HENT: Negative for dental problem, ear pain, sinus pressure and sore throat.   Respiratory: Negative for cough, chest tightness, shortness of breath and wheezing.   Cardiovascular: Negative for chest pain.  Gastrointestinal: Negative for abdominal distention, abdominal pain, blood in stool, constipation, diarrhea, nausea and vomiting.  Genitourinary: Negative for difficulty urinating, dysuria, enuresis, frequency, genital sores, pelvic pain, urgency, vaginal bleeding and vaginal discharge.       Objective:   Physical Exam  Constitutional: She is oriented to person, place, and time. She appears well-developed. No distress.  HENT:  Right Ear: External ear normal.  Left Ear: External ear normal.  Mouth/Throat: Oropharynx is clear and moist.  Neck: Normal range of motion. Neck supple. No tracheal deviation present. No thyromegaly present.  Cardiovascular: Normal rate, regular rhythm and normal heart sounds.  Exam reveals no gallop.   No murmur heard. Pulmonary/Chest: Effort normal and breath sounds normal.  Abdominal: Soft. She exhibits no distension. There is no tenderness.  Genitourinary: Vagina normal and uterus normal. No vaginal discharge found.  Genitourinary Comments: External GU: no lesions or rash. Vagina: no discharge. Bimanual exam: no tenderness or obvious masses. Significant scar tissue midline. Exam limited due to abdominal girth.   Musculoskeletal: She exhibits no edema.  Lymphadenopathy:    She has no cervical adenopathy.  Neurological: She is  alert and oriented to person, place, and time.  Skin: Skin is warm and dry. No rash noted.  Psychiatric: She has a normal mood and affect. Her behavior is normal.  Vitals reviewed. Breast exam: no masses; axillae no adenopathy.         Assessment & Plan:  Routine general medical examination at a health care facility  Postmenopausal - Plan: DG Bone Density  Screening for osteoporosis - Plan: DG Bone Density  Continue vitamin D and calcium supplement.  Given Rx for shingrix vaccines. Return for follow up with Dr. Richardson Landry as planned in Dec.

## 2016-10-19 ENCOUNTER — Other Ambulatory Visit: Payer: Self-pay | Admitting: Family Medicine

## 2016-10-24 ENCOUNTER — Other Ambulatory Visit (HOSPITAL_COMMUNITY): Payer: Medicare Other

## 2016-10-31 DIAGNOSIS — H40003 Preglaucoma, unspecified, bilateral: Secondary | ICD-10-CM | POA: Diagnosis not present

## 2016-10-31 DIAGNOSIS — Q078 Other specified congenital malformations of nervous system: Secondary | ICD-10-CM | POA: Diagnosis not present

## 2016-11-01 ENCOUNTER — Encounter: Payer: Self-pay | Admitting: Nurse Practitioner

## 2016-11-01 ENCOUNTER — Ambulatory Visit (HOSPITAL_COMMUNITY)
Admission: RE | Admit: 2016-11-01 | Discharge: 2016-11-01 | Disposition: A | Discharge status: Home / Self Care | Payer: Medicare Other | Source: Ambulatory Visit | Attending: Nurse Practitioner | Admitting: Nurse Practitioner

## 2016-11-01 DIAGNOSIS — M8580 Other specified disorders of bone density and structure, unspecified site: Secondary | ICD-10-CM | POA: Insufficient documentation

## 2016-11-01 DIAGNOSIS — Z780 Asymptomatic menopausal state: Secondary | ICD-10-CM | POA: Diagnosis not present

## 2016-11-01 DIAGNOSIS — Z13820 Encounter for screening for osteoporosis: Secondary | ICD-10-CM | POA: Diagnosis not present

## 2016-11-01 DIAGNOSIS — M85851 Other specified disorders of bone density and structure, right thigh: Secondary | ICD-10-CM | POA: Diagnosis not present

## 2016-11-13 ENCOUNTER — Encounter: Payer: Self-pay | Admitting: Family Medicine

## 2016-11-14 DIAGNOSIS — N951 Menopausal and female climacteric states: Secondary | ICD-10-CM | POA: Diagnosis not present

## 2016-11-14 MED ORDER — LOSARTAN POTASSIUM 100 MG PO TABS: 100.0000 mg | ORAL_TABLET | Freq: Every day | ORAL | 0 refills | Status: DC

## 2016-11-14 NOTE — Addendum Note (Signed)
Addended by: Dairl Ponder on: 11/14/2016 10:05 AM   Modules accepted: Orders

## 2016-11-14 NOTE — Addendum Note (Signed)
Addended by: Dairl Ponder on: 11/14/2016 10:06 AM   Modules accepted: Orders

## 2016-11-14 NOTE — Telephone Encounter (Signed)
This medication was recalled because of impurities in the manufacturing process not because the chemical was necessarily bad. So I would recommend stopping valsartan-I recommend starting losartan 100 mg, 1 daily, #30 with 3 refills, I recommend a follow-up office visit somewhere within the next 2-3 months to ensure that blood pressure is under good control

## 2016-11-15 DIAGNOSIS — R232 Flushing: Secondary | ICD-10-CM | POA: Diagnosis not present

## 2016-11-17 ENCOUNTER — Other Ambulatory Visit: Payer: Self-pay | Admitting: Family Medicine

## 2016-11-17 MED ORDER — LOSARTAN POTASSIUM 100 MG PO TABS: 100.0000 mg | ORAL_TABLET | Freq: Every day | ORAL | 0 refills | Status: DC

## 2016-11-29 ENCOUNTER — Encounter: Payer: Self-pay | Admitting: Family Medicine

## 2016-12-06 ENCOUNTER — Encounter: Payer: Self-pay | Admitting: Family Medicine

## 2016-12-06 ENCOUNTER — Ambulatory Visit (INDEPENDENT_AMBULATORY_CARE_PROVIDER_SITE_OTHER): Payer: Medicare Other | Admitting: Family Medicine

## 2016-12-06 VITALS — BP 130/84 | Temp 98.4°F | Ht 62.0 in | Wt 151.2 lb

## 2016-12-06 DIAGNOSIS — R300 Dysuria: Secondary | ICD-10-CM

## 2016-12-06 DIAGNOSIS — N3001 Acute cystitis with hematuria: Secondary | ICD-10-CM | POA: Diagnosis not present

## 2016-12-06 LAB — POCT URINALYSIS DIPSTICK
PH UA: 6 (ref 5.0–8.0)
Spec Grav, UA: 1.015 (ref 1.010–1.025)

## 2016-12-06 MED ORDER — NITROFURANTOIN MONOHYD MACRO 100 MG PO CAPS: 100.0000 mg | ORAL_CAPSULE | Freq: Two times a day (BID) | ORAL | 0 refills | Status: AC

## 2016-12-06 NOTE — Progress Notes (Signed)
   Subjective:    Patient ID: Bianca Arroyo, female    DOB: [birth year], 66 y.o.   MRN: [id number]  HPI Patient arrives with c/o dysuria for a few days.  Urine a ltitlle dark and some blood on the tissue  Noted some pain with urin  Couple d ago kicked in  No urinary tract infxn lately  Some pain and discomfort     No fever ver or chills no nausea Review of Systems nof fever no h a, no rash     Objective:   Physical Exam  Alert vitals stable, NAD. Blood pressure good on repeat. HEENT normal. Lungs clear. Heart regular rate and rhythm. No CVA tenderness  Urinalysis 2 numerous to count white blood cells and red blood cells      Assessment & Plan:  Impression urinary tract infection. Multiple questions answered. Macrobid 100 twice a day 7 days. Culture urine. Local measures discussed  Greater than 50% of this 15 minute face to face visit was spent in counseling and discussion and coordination of care regarding the above diagnosis/diagnosies

## 2016-12-06 NOTE — Addendum Note (Signed)
Addended by: Dairl Ponder on: 12/06/2016 01:44 PM   Modules accepted: Orders

## 2016-12-08 LAB — URINE CULTURE

## 2016-12-20 ENCOUNTER — Other Ambulatory Visit: Payer: Self-pay | Admitting: Family Medicine

## 2016-12-20 NOTE — Telephone Encounter (Signed)
Last seen 10/14/16

## 2017-01-11 ENCOUNTER — Ambulatory Visit (INDEPENDENT_AMBULATORY_CARE_PROVIDER_SITE_OTHER): Payer: Medicare Other | Admitting: *Deleted

## 2017-01-11 DIAGNOSIS — Z23 Encounter for immunization: Secondary | ICD-10-CM | POA: Diagnosis not present

## 2017-02-02 ENCOUNTER — Other Ambulatory Visit: Payer: Self-pay | Admitting: Family Medicine

## 2017-02-02 ENCOUNTER — Other Ambulatory Visit: Payer: Self-pay | Admitting: Nurse Practitioner

## 2017-02-07 ENCOUNTER — Other Ambulatory Visit: Payer: Self-pay | Admitting: Family Medicine

## 2017-02-14 DIAGNOSIS — R232 Flushing: Secondary | ICD-10-CM | POA: Diagnosis not present

## 2017-02-17 DIAGNOSIS — R232 Flushing: Secondary | ICD-10-CM | POA: Diagnosis not present

## 2017-02-17 DIAGNOSIS — R5383 Other fatigue: Secondary | ICD-10-CM | POA: Diagnosis not present

## 2017-02-17 DIAGNOSIS — R6882 Decreased libido: Secondary | ICD-10-CM | POA: Diagnosis not present

## 2017-03-04 ENCOUNTER — Other Ambulatory Visit: Payer: Self-pay | Admitting: Family Medicine

## 2017-03-17 ENCOUNTER — Other Ambulatory Visit: Payer: Self-pay | Admitting: Family Medicine

## 2017-03-20 ENCOUNTER — Ambulatory Visit: Payer: Medicare Other | Admitting: Family Medicine

## 2017-03-20 DIAGNOSIS — N950 Postmenopausal bleeding: Secondary | ICD-10-CM | POA: Diagnosis not present

## 2017-03-28 ENCOUNTER — Ambulatory Visit: Payer: Medicare Other | Admitting: Family Medicine

## 2017-03-29 DIAGNOSIS — N950 Postmenopausal bleeding: Secondary | ICD-10-CM | POA: Diagnosis not present

## 2017-03-29 DIAGNOSIS — N939 Abnormal uterine and vaginal bleeding, unspecified: Secondary | ICD-10-CM | POA: Diagnosis not present

## 2017-03-30 ENCOUNTER — Encounter: Payer: Self-pay | Admitting: Family Medicine

## 2017-03-30 ENCOUNTER — Ambulatory Visit (INDEPENDENT_AMBULATORY_CARE_PROVIDER_SITE_OTHER): Payer: Medicare Other | Admitting: Family Medicine

## 2017-03-30 VITALS — BP 154/82 | Ht 62.0 in | Wt 152.0 lb

## 2017-03-30 DIAGNOSIS — I10 Essential (primary) hypertension: Secondary | ICD-10-CM | POA: Diagnosis not present

## 2017-03-30 DIAGNOSIS — E876 Hypokalemia: Secondary | ICD-10-CM

## 2017-03-30 DIAGNOSIS — E119 Type 2 diabetes mellitus without complications: Secondary | ICD-10-CM | POA: Diagnosis not present

## 2017-03-30 DIAGNOSIS — Z79899 Other long term (current) drug therapy: Secondary | ICD-10-CM | POA: Diagnosis not present

## 2017-03-30 DIAGNOSIS — E785 Hyperlipidemia, unspecified: Secondary | ICD-10-CM | POA: Diagnosis not present

## 2017-03-30 LAB — POCT GLYCOSYLATED HEMOGLOBIN (HGB A1C): HEMOGLOBIN A1C: 5.9

## 2017-03-30 MED ORDER — GLIPIZIDE 5 MG PO TABS: ORAL_TABLET | ORAL | 1 refills | Status: DC

## 2017-03-30 MED ORDER — FLUTICASONE PROPIONATE 50 MCG/ACT NA SUSP: NASAL | 1 refills | Status: DC

## 2017-03-30 MED ORDER — METOPROLOL SUCCINATE ER 100 MG PO TB24: ORAL_TABLET | ORAL | 1 refills | Status: DC

## 2017-03-30 MED ORDER — LOSARTAN POTASSIUM 100 MG PO TABS: 100.0000 mg | ORAL_TABLET | Freq: Every day | ORAL | 1 refills | Status: DC

## 2017-03-30 MED ORDER — HYDROCHLOROTHIAZIDE 25 MG PO TABS: 25.0000 mg | ORAL_TABLET | Freq: Two times a day (BID) | ORAL | 1 refills | Status: DC

## 2017-03-30 MED ORDER — LOVASTATIN 20 MG PO TABS: ORAL_TABLET | ORAL | 1 refills | Status: DC

## 2017-03-30 NOTE — Progress Notes (Signed)
   Subjective:    Patient ID: Bianca Arroyo, female    DOB: [birth year], 66 y.o.   MRN: [id number] Patient arrives with numerous concerns HPI Patient is here for a follow up on DM. She is currently on Glipizide 5 mg one BID.Eats healthy and exercises.   Review of Systems Results for orders placed or performed in visit on 03/30/17  POCT glycosylated hemoglobin (Hb A1C)  Result Value Ref Range   Hemoglobin A1C 5.9    Patient continues to take lipid medication regularly. No obvious side effects from it. Generally does not miss a dose. Prior blood work results are reviewed with patient. Patient continues to work on fat intake in diet  Blood pressure medicine and blood pressure levels reviewed today with patient. Compliant with blood pressure medicine. States does not miss a dose. No obvious side effects. Blood pressure generally good when checked elsewhere. Watching salt intake.   Patient claims compliance with diabetes medication. No obvious side effects. Reports no substantial low sugar spells. Most numbers are generally in good range when checked fasting. Generally does not miss a dose of medication. Watching diabetic diet closely  flu shot already given    Low sugar pells , rare , lowest numb 8 3   Eye exam in April good   Exercising reg  Watching diet      Objective:   Physical Exam Alert and oriented, vitals reviewed and stable, NAD ENT-TM's and ext canals WNL bilat via otoscopic exam Soft palate, tonsils and post pharynx WNL via oropharyngeal exam Neck-symmetric, no masses; thyroid nonpalpable and nontender Pulmonary-no tachypnea or accessory muscle use; Clear without wheezes via auscultation Card--no abnrml murmurs, rhythm reg and rate WNL Carotid pulses symmetric, without bruits Impression 1 type 2 diabetes.      Assessment & Plan:  Good control discussed.  2.  Hyperlipidemia prior blood results discussed to maintain same meds compliance discussed  3.   Hypertension control is suboptimal despite full dose on 3 different medications long discussion held.  Offered adding a fourth medication versus versus consultation.  Patient would like to see specialist will consult cardiology to see if we can get better blood pressure control for this patient.

## 2017-03-31 ENCOUNTER — Encounter: Payer: Self-pay | Admitting: Family Medicine

## 2017-03-31 DIAGNOSIS — E785 Hyperlipidemia, unspecified: Secondary | ICD-10-CM | POA: Diagnosis not present

## 2017-03-31 DIAGNOSIS — E119 Type 2 diabetes mellitus without complications: Secondary | ICD-10-CM | POA: Diagnosis not present

## 2017-03-31 DIAGNOSIS — Z79899 Other long term (current) drug therapy: Secondary | ICD-10-CM | POA: Diagnosis not present

## 2017-04-01 LAB — BASIC METABOLIC PANEL
BUN/Creatinine Ratio: 24 (ref 12–28)
BUN: 20 mg/dL (ref 8–27)
CALCIUM: 9.7 mg/dL (ref 8.7–10.3)
CHLORIDE: 100 mmol/L (ref 96–106)
CO2: 25 mmol/L (ref 20–29)
Creatinine, Ser: 0.82 mg/dL (ref 0.57–1.00)
GFR calc non Af Amer: 74 mL/min/{1.73_m2} (ref >59)
GFR, EST AFRICAN AMERICAN: 86 mL/min/{1.73_m2} (ref >59)
Glucose: 120 mg/dL — ABNORMAL HIGH (ref 65–99)
POTASSIUM: 3.3 mmol/L — AB (ref 3.5–5.2)
SODIUM: 140 mmol/L (ref 134–144)

## 2017-04-01 LAB — HEPATIC FUNCTION PANEL
ALT: 15 IU/L (ref 0–32)
AST: 23 IU/L (ref 0–40)
Albumin: 4.4 g/dL (ref 3.6–4.8)
Alkaline Phosphatase: 71 IU/L (ref 39–117)
BILIRUBIN TOTAL: 0.5 mg/dL (ref 0.0–1.2)
Bilirubin, Direct: 0.15 mg/dL (ref 0.00–0.40)
Total Protein: 7.3 g/dL (ref 6.0–8.5)

## 2017-04-01 LAB — LIPID PANEL
CHOLESTEROL TOTAL: 153 mg/dL (ref 100–199)
Chol/HDL Ratio: 2.3 ratio (ref 0.0–4.4)
HDL: 67 mg/dL (ref >39)
LDL CALC: 66 mg/dL (ref 0–99)
TRIGLYCERIDES: 101 mg/dL (ref 0–149)
VLDL CHOLESTEROL CAL: 20 mg/dL (ref 5–40)

## 2017-04-01 LAB — MICROALBUMIN / CREATININE URINE RATIO
Creatinine, Urine: 100.8 mg/dL
MICROALB/CREAT RATIO: 67.2 mg/g{creat} — AB (ref 0.0–30.0)
Microalbumin, Urine: 67.7 ug/mL

## 2017-04-04 MED ORDER — POTASSIUM CHLORIDE CRYS ER 20 MEQ PO TBCR: 20.0000 meq | EXTENDED_RELEASE_TABLET | Freq: Every day | ORAL | 11 refills | Status: DC

## 2017-04-04 NOTE — Addendum Note (Signed)
Addended by: Dairl Ponder on: 04/04/2017 04:33 PM   Modules accepted: Orders

## 2017-04-12 ENCOUNTER — Ambulatory Visit: Payer: Medicare Other | Admitting: Internal Medicine

## 2017-04-25 DIAGNOSIS — N950 Postmenopausal bleeding: Secondary | ICD-10-CM | POA: Diagnosis not present

## 2017-04-25 DIAGNOSIS — N951 Menopausal and female climacteric states: Secondary | ICD-10-CM | POA: Diagnosis not present

## 2017-05-05 ENCOUNTER — Ambulatory Visit: Payer: Medicare Other | Admitting: Cardiology

## 2017-05-09 DIAGNOSIS — H43812 Vitreous degeneration, left eye: Secondary | ICD-10-CM | POA: Diagnosis not present

## 2017-05-09 DIAGNOSIS — E119 Type 2 diabetes mellitus without complications: Secondary | ICD-10-CM | POA: Diagnosis not present

## 2017-05-09 DIAGNOSIS — H40003 Preglaucoma, unspecified, bilateral: Secondary | ICD-10-CM | POA: Diagnosis not present

## 2017-05-09 DIAGNOSIS — H35371 Puckering of macula, right eye: Secondary | ICD-10-CM | POA: Diagnosis not present

## 2017-05-09 DIAGNOSIS — H25813 Combined forms of age-related cataract, bilateral: Secondary | ICD-10-CM | POA: Diagnosis not present

## 2017-05-09 LAB — HM DIABETES EYE EXAM

## 2017-05-10 DIAGNOSIS — E876 Hypokalemia: Secondary | ICD-10-CM | POA: Diagnosis not present

## 2017-05-11 LAB — BASIC METABOLIC PANEL
BUN / CREAT RATIO: 15 (ref 12–28)
BUN: 15 mg/dL (ref 8–27)
CHLORIDE: 100 mmol/L (ref 96–106)
CO2: 25 mmol/L (ref 20–29)
Calcium: 9.6 mg/dL (ref 8.7–10.3)
Creatinine, Ser: 0.98 mg/dL (ref 0.57–1.00)
GFR calc non Af Amer: 60 mL/min/{1.73_m2} (ref >59)
GFR, EST AFRICAN AMERICAN: 69 mL/min/{1.73_m2} (ref >59)
Glucose: 161 mg/dL — ABNORMAL HIGH (ref 65–99)
Potassium: 4.1 mmol/L (ref 3.5–5.2)
SODIUM: 141 mmol/L (ref 134–144)

## 2017-05-13 ENCOUNTER — Other Ambulatory Visit: Payer: Self-pay | Admitting: Nurse Practitioner

## 2017-05-19 ENCOUNTER — Encounter: Payer: Self-pay | Admitting: Cardiology

## 2017-05-19 NOTE — Progress Notes (Signed)
Cardiology Office Note  Date: 05/23/2017   ID: REFUGIO MCCONICO, DOB 02-21-1951, MRN [id number]  PCP: Mikey Kirschner, MD  Consulting Cardiologist: Rozann Lesches, MD   Chief Complaint  Patient presents with  . Hypertension    History of Present Illness: Bianca Arroyo is a 67 y.o. female referred for cardiology consultation with Dr. Wolfgang Phoenix to assist with management of hypertension.  She states that overall she feels well.  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  She does not report any exertional chest pain or unusual shortness of breath.  She states that she has had hypertension for many years, remains compliant with medications.  Her most recent adjustments include an increase in hydrochlorothiazide dose.  We discussed her diet today, she thinks that she probably does eat too much salt.  We did discuss sodium restriction.  We also went over other potential medication adjustments rather than simply adding to her current regimen.  I went over her recent lab work, potassium and renal function were normal.  I personally reviewed her ECG today which shows sinus rhythm with LVH and probable repolarization abnormalities.  She reports no history of cardiomyopathy.  Past Medical History:  Diagnosis Date  . Hyperlipidemia   . Hypertension   . Type 2 diabetes mellitus ([place])     Past Surgical History:  Procedure Laterality Date  . CESAREAN SECTION    . COLONOSCOPY    . COLONOSCOPY N/A 12/06/2013   Procedure: COLONOSCOPY;  Surgeon: Danie Binder, MD;  Location: [facility];  Service: Endoscopy;  Laterality: N/A;  9:30 AM  . Left wrist surgery    . Right trigger thumb  2013    Current Outpatient Medications  Medication Sig Dispense Refill  . albuterol (PROVENTIL HFA;VENTOLIN HFA) 108 (90 BASE) MCG/ACT inhaler Inhale 2 puffs into the lungs every 6 (six) hours as needed for wheezing or shortness of breath. (Patient taking  differently: Inhale 2 puffs into the lungs every 4 (four) hours as needed for wheezing or shortness of breath. ) 1 Inhaler 0  . aspirin 81 MG chewable tablet Chew 81 mg by mouth every morning.     . fluticasone (FLONASE) 50 MCG/ACT nasal spray USE TWO SPRAY(S) IN EACH NOSTRIL ONCE DAILY 16 g 1  . glipiZIDE (GLUCOTROL) 5 MG tablet TAKE 1 TABLET BY MOUTH TWICE DAILY BEFORE A MEAL 180 tablet 1  . ibuprofen (ADVIL,MOTRIN) 600 MG tablet Take 1 tablet (600 mg total) by mouth every 8 (eight) hours as needed. 30 tablet 0  . ipratropium (ATROVENT) 0.06 % nasal spray Place 1-2 sprays into the nose daily as needed.    Marland Kitchen losartan (COZAAR) 100 MG tablet Take 1 tablet (100 mg total) by mouth daily. 90 tablet 1  . lovastatin (MEVACOR) 20 MG tablet TAKE 1 TABLET (20 MG TOTAL) BY MOUTH DAILY. 90 tablet 1  . metoprolol succinate (TOPROL-XL) 100 MG 24 hr tablet TAKE 1 TABLET BY MOUTH ONCE DAILY WITH  OR  IMMEDIATELY  FOLLOWING  A  MEAL 90 tablet 1  . Multiple Vitamins-Minerals (ALIVE ONCE DAILY WOMENS 50+ PO) Take 1 tablet by mouth every morning.     Marland Kitchen PRESCRIPTION MEDICATION Testosterone Pellet 50 mg  Q 2-3 months    . PRESCRIPTION MEDICATION Estradiol Pellet 6 mg every 2-3 months    . progesterone (PROMETRIUM) 100 MG capsule Take 100 mg by mouth daily.    . Tetrahydrozoline HCl (  VISINE OP) Apply 1 drop to eye daily as needed (dry eye relief).     . valACYclovir (VALTREX) 500 MG tablet TAKE 1 TABLET BY MOUTH TWICE DAILY FOR 3 DAYS AS NEEDED FOR  FLARES 18 tablet 0  . vitamin B-12 (CYANOCOBALAMIN) 1000 MCG tablet Take 1,000 mcg by mouth every morning.    Marland Kitchen spironolactone (ALDACTONE) 25 MG tablet Take 1 tablet (25 mg total) by mouth daily. 30 tablet 0   No current facility-administered medications for this visit.    Allergies:  Colcrys [colchicine]   Social History: The patient  reports that  has never smoked. she has never used smokeless tobacco. She reports that she drinks about 1.5 oz of alcohol per week. She  reports that she does not use drugs.   Family History: The patient's family history includes Diabetes in her father; Hypertension in her father.   ROS:  Please see the history of present illness. Otherwise, complete review of systems is positive for none.  All other systems are reviewed and negative.   Physical Exam: VS:  BP (!) 162/78   Pulse 65   Ht 5\' 2"  (1.575 m)   Wt 153 lb (69.4 kg)   SpO2 95%   BMI 27.98 kg/m , BMI Body mass index is 27.98 kg/m.  Wt Readings from Last 3 Encounters:  05/23/17 153 lb (69.4 kg)  03/30/17 152 lb (68.9 kg)  12/06/16 151 lb 3.2 oz (68.6 kg)    General: Patient appears comfortable at rest. HEENT: Conjunctiva and lids normal, oropharynx clear. Neck: Supple, no elevated JVP or carotid bruits, no thyromegaly. Lungs: Clear to auscultation, nonlabored breathing at rest. Cardiac: Regular rate and rhythm, no S3 or significant systolic murmur, no pericardial rub. Abdomen: Soft, nontender, bowel sounds present. Extremities: No pitting edema, distal pulses 2+. Skin: Warm and dry. Musculoskeletal: No kyphosis. Neuropsychiatric: Alert and oriented x3, affect grossly appropriate.  ECG: There is no old tracing available for comparison.  Recent Labwork: 03/31/2017: ALT 15; AST 23 05/10/2017: BUN 15; Creatinine, Ser 0.98; Potassium 4.1; Sodium 141     Component Value Date/Time   CHOL 153 03/31/2017 1021   TRIG 101 03/31/2017 1021   HDL 67 03/31/2017 1021   CHOLHDL 2.3 03/31/2017 1021   CHOLHDL 2.5 06/24/2014 1058   VLDL 13 06/24/2014 1058   LDLCALC 66 03/31/2017 1021    Other Studies Reviewed Today:  Chest x-ray 10/28/2014: FINDINGS: No active infiltrate or effusion is seen. Mediastinal and hilar contours are unremarkable. The heart is within upper limits of normal. No acute bony abnormality is seen.  IMPRESSION: No active cardiopulmonary disease.  Assessment and Plan:  1.  Essential hypertension.  I reviewed her current medications.  Plan  will be to continue present doses of Cozaar and Toprol-XL with a change from HCTZ and potassium supplements to Aldactone beginning at 25 mg daily.  We will obtain an echocardiogram to assess cardiac structure and function in light of abnormal ECG.  Follow-up in the next few weeks with BMET and to consider additional adjustments.  We also discussed salt restriction in the diet.  Next step would be possibly increasing Aldactone further or switching from Toprol-XL to Coreg.  2.  Type 2 diabetes mellitus, currently on Glucotrol with follow-up per Dr. Wolfgang Phoenix.  Hemoglobin A1c was 5.9 in December 2018.  3.  Mixed hyperlipidemia, on Mevacor.  Recent LDL 66.  Current medicines were reviewed with the patient today.   Orders Placed This Encounter  Procedures  . Basic metabolic  panel  . EKG 12-Lead  . ECHOCARDIOGRAM COMPLETE    Disposition: Follow-up in the next few weeks.   Signed, Satira Sark, MD, [facility] 05/23/2017 3:21 PM    [facility] [place], [place], [place] [address] Phone: [phone]; Fax: [phone]

## 2017-05-23 ENCOUNTER — Ambulatory Visit (INDEPENDENT_AMBULATORY_CARE_PROVIDER_SITE_OTHER): Payer: Medicare Other | Admitting: Cardiology

## 2017-05-23 ENCOUNTER — Encounter: Payer: Self-pay | Admitting: Cardiology

## 2017-05-23 VITALS — BP 162/78 | HR 65 | Ht 62.0 in | Wt 153.0 lb

## 2017-05-23 DIAGNOSIS — R9431 Abnormal electrocardiogram [ECG] [EKG]: Secondary | ICD-10-CM

## 2017-05-23 DIAGNOSIS — I10 Essential (primary) hypertension: Secondary | ICD-10-CM

## 2017-05-23 DIAGNOSIS — E782 Mixed hyperlipidemia: Secondary | ICD-10-CM

## 2017-05-23 DIAGNOSIS — E119 Type 2 diabetes mellitus without complications: Secondary | ICD-10-CM

## 2017-05-23 DIAGNOSIS — Z794 Long term (current) use of insulin: Secondary | ICD-10-CM | POA: Diagnosis not present

## 2017-05-23 MED ORDER — SPIRONOLACTONE 25 MG PO TABS: 25.0000 mg | ORAL_TABLET | Freq: Every day | ORAL | 0 refills | Status: DC

## 2017-05-23 NOTE — Patient Instructions (Signed)
Medication Instructions:  Your physician has recommended you make the following change in your medication:   STOP Hydrochlorothiazide  STOP Potassium  BEGIN Aldactone 25 mg daily  Please continue all other medications as prescribed  Labwork: BMET prior to next appointment Orders given today  Testing/Procedures: Your physician has requested that you have an echocardiogram. Echocardiography is a painless test that uses sound waves to create images of your heart. It provides your doctor with information about the size and shape of your heart and how well your heart's chambers and valves are working. This procedure takes approximately one hour. There are no restrictions for this procedure.  Follow-Up: Your physician recommends that you schedule a follow-up appointment in: [place]  Any Other Special Instructions Will Be Listed Below (If Applicable).  If you need a refill on your cardiac medications before your next appointment, please call your pharmacy.

## 2017-05-30 ENCOUNTER — Ambulatory Visit (HOSPITAL_COMMUNITY)
Admission: RE | Admit: 2017-05-30 | Discharge: 2017-05-30 | Disposition: A | Discharge status: Home / Self Care | Payer: Medicare Other | Source: Ambulatory Visit | Attending: Cardiology | Admitting: Cardiology

## 2017-05-30 DIAGNOSIS — E785 Hyperlipidemia, unspecified: Secondary | ICD-10-CM | POA: Diagnosis not present

## 2017-05-30 DIAGNOSIS — I340 Nonrheumatic mitral (valve) insufficiency: Secondary | ICD-10-CM | POA: Insufficient documentation

## 2017-05-30 DIAGNOSIS — I10 Essential (primary) hypertension: Secondary | ICD-10-CM | POA: Diagnosis not present

## 2017-05-30 DIAGNOSIS — R9431 Abnormal electrocardiogram [ECG] [EKG]: Secondary | ICD-10-CM | POA: Diagnosis not present

## 2017-05-30 DIAGNOSIS — E119 Type 2 diabetes mellitus without complications: Secondary | ICD-10-CM | POA: Diagnosis not present

## 2017-05-30 NOTE — Progress Notes (Signed)
*  PRELIMINARY RESULTS* Echocardiogram 2D Echocardiogram has been performed.  [name] 05/30/2017, 12:06 PM

## 2017-05-31 ENCOUNTER — Telehealth: Payer: Self-pay

## 2017-05-31 NOTE — Telephone Encounter (Signed)
-----   Message from Satira Sark, MD sent at 05/31/2017  8:22 AM EST ----- Results reviewed.  LVEF is normal range.  Continue with current plan. A copy of this test should be forwarded to Mikey Kirschner, MD.

## 2017-05-31 NOTE — Telephone Encounter (Signed)
Patient notified. Routed to PCP 

## 2017-06-08 DIAGNOSIS — I10 Essential (primary) hypertension: Secondary | ICD-10-CM | POA: Diagnosis not present

## 2017-06-11 NOTE — Progress Notes (Signed)
Cardiology Office Note    Date:  06/13/2017   ID:  Bianca, Arroyo October 14, 1950, MRN [id number]  PCP:  Mikey Kirschner, MD  Cardiologist: Rozann Lesches, MD    Chief Complaint  Patient presents with  . Follow-up    3 week visit    History of Present Illness:    Bianca Arroyo is a 67 y.o. female with past medical history of HTN, HLD,and Type 2 DM who presents to the office today for 3-week follow-up.   She was evaluated by Dr. Domenic Polite on 05/23/2017 as a new patient referral for assistant with management of her hypertension. She denied any recent chest pain or dyspnea on exertion at that time. She was continued on Losartan and Toprol-XL with HCTZ being transitioned to Spironolactone 25 mg daily. Her EKG did show sinus rhythm with LVH and likely repolarization abnormalities, therefore an echocardiogram was recommended for further evaluation.  This was obtained on 05/30/2017 and showed a preserved EF of 65-70%, no regional wall motion abnormalities, and mild MR.  In talking with the patient today, she reports doing well from a cardiac perspective since her last office visit.  She reports good compliance with her medication regimen and denies any noted side effects to newly started Spironolactone. She has been suffering from congestion and has been taking decongestants regularly (Delsym).   She denies any recent chest pain, dyspnea on exertion, palpitations, orthopnea, PND, or lower extremity edema.    Past Medical History:  Diagnosis Date  . H/O echocardiogram    a. 05/2017: echo showing EF of 65-70%, no regional WMA, moderate LVH, and mild MR.   Marland Kitchen Hyperlipidemia   . Hypertension   . Type 2 diabetes mellitus ([place])     Past Surgical History:  Procedure Laterality Date  . CESAREAN SECTION    . COLONOSCOPY    . COLONOSCOPY N/A 12/06/2013   Procedure: COLONOSCOPY;  Surgeon: Danie Binder, MD;  [facility];  Service: Endoscopy;  Laterality: N/A;  9:30 AM  .  Left wrist surgery    . Right trigger thumb  2013    Current Medications: Outpatient Medications Prior to Visit  Medication Sig Dispense Refill  . albuterol (PROVENTIL HFA;VENTOLIN HFA) 108 (90 BASE) MCG/ACT inhaler Inhale 2 puffs into the lungs every 6 (six) hours as needed for wheezing or shortness of breath. (Patient taking differently: Inhale 2 puffs into the lungs every 4 (four) hours as needed for wheezing or shortness of breath. ) 1 Inhaler 0  . aspirin 81 MG chewable tablet Chew 81 mg by mouth every morning.     . fluticasone (FLONASE) 50 MCG/ACT nasal spray USE TWO SPRAY(S) IN EACH NOSTRIL ONCE DAILY 16 g 1  . glipiZIDE (GLUCOTROL) 5 MG tablet TAKE 1 TABLET BY MOUTH TWICE DAILY BEFORE A MEAL 180 tablet 1  . ibuprofen (ADVIL,MOTRIN) 600 MG tablet Take 1 tablet (600 mg total) by mouth every 8 (eight) hours as needed. 30 tablet 0  . ipratropium (ATROVENT) 0.06 % nasal spray Place 1-2 sprays into the nose daily as needed.    Marland Kitchen losartan (COZAAR) 100 MG tablet Take 1 tablet (100 mg total) by mouth daily. 90 tablet 1  . lovastatin (MEVACOR) 20 MG tablet TAKE 1 TABLET (20 MG TOTAL) BY MOUTH DAILY. 90 tablet 1  . metoprolol succinate (TOPROL-XL) 100 MG 24 hr tablet TAKE 1 TABLET BY MOUTH ONCE DAILY WITH  OR  IMMEDIATELY  FOLLOWING  A  MEAL 90 tablet 1  .  Multiple Vitamins-Minerals (ALIVE ONCE DAILY WOMENS 50+ PO) Take 1 tablet by mouth every morning.     Marland Kitchen PRESCRIPTION MEDICATION Testosterone Pellet 50 mg  Q 2-3 months    . PRESCRIPTION MEDICATION Estradiol Pellet 6 mg every 2-3 months    . progesterone (PROMETRIUM) 100 MG capsule Take 100 mg by mouth daily.    Marland Kitchen spironolactone (ALDACTONE) 25 MG tablet TAKE 1 TABLET BY MOUTH ONCE DAILY 90 tablet 0  . Tetrahydrozoline HCl (VISINE OP) Apply 1 drop to eye daily as needed (dry eye relief).     . valACYclovir (VALTREX) 500 MG tablet TAKE 1 TABLET BY MOUTH TWICE DAILY FOR 3 DAYS AS NEEDED FOR  FLARES 18 tablet 0  . valACYclovir (VALTREX) 500 MG  tablet TAKE 1 TABLET BY MOUTH TWICE DAILY FOR 3 DAYS AS NEEDED FOR  FLARES 18 tablet 0  . vitamin B-12 (CYANOCOBALAMIN) 1000 MCG tablet Take 1,000 mcg by mouth every morning.     No facility-administered medications prior to visit.      Allergies:   Colcrys [colchicine]   Social History   Socioeconomic History  . Marital status: Divorced    Spouse name: None  . Number of children: None  . Years of education: None  . Highest education level: None  Social Needs  . Financial resource strain: None  . Food insecurity - worry: None  . Food insecurity - inability: None  . Transportation needs - medical: None  . Transportation needs - non-medical: None  Occupational History  . None  Tobacco Use  . Smoking status: Never Smoker  . Smokeless tobacco: Never Used  Substance and Sexual Activity  . Alcohol use: Yes    Alcohol/week: 1.5 oz    Types: 3 Standard drinks or equivalent per week  . Drug use: No  . Sexual activity: None  Other Topics Concern  . None  Social History Narrative  . None     Family History:  The patient's family history includes Diabetes in her father, mother, and sister; High blood pressure in her mother and sister; Hypertension in her father.   Review of Systems:   Please see the history of present illness.     General:  No chills, fever, night sweats or weight changes.  Cardiovascular:  No chest pain, dyspnea on exertion, edema, orthopnea, palpitations, paroxysmal nocturnal dyspnea. Dermatological: No rash, lesions/masses Respiratory: No cough, dyspnea. Positive for sinus congestion.  Urologic: No hematuria, dysuria Abdominal:   No nausea, vomiting, diarrhea, bright red blood per rectum, melena, or hematemesis Neurologic:  No visual changes, wkns, changes in mental status. All other systems reviewed and are otherwise negative except as noted above.   Physical Exam:    VS:  BP (!) 188/102   Pulse 74   Ht 5\' 2"  (1.575 m)   Wt 149 lb 12.8 oz (67.9 kg)    SpO2 98%   BMI 27.40 kg/m    General: Well developed, African American female appearing in no acute distress. Head: Normocephalic, atraumatic, sclera non-icteric, no xanthomas, nares are without discharge.  Neck: No carotid bruits. JVD not elevated.  Lungs: Respirations regular and unlabored, without wheezes or rales.  Heart: Regular rate and rhythm. No S3 or S4.  No murmur, no rubs, or gallops appreciated. Abdomen: Soft, non-tender, non-distended with normoactive bowel sounds. No hepatomegaly. No rebound/guarding. No obvious abdominal masses. Msk:  Strength and tone appear normal for age. No joint deformities or effusions. Extremities: No clubbing or cyanosis. No lower extremity edema.  Distal pedal pulses are 2+ bilaterally. Neuro: Alert and oriented X 3. Moves all extremities spontaneously. No focal deficits noted. Psych:  Responds to questions appropriately with a normal affect. Skin: No rashes or lesions noted  Wt Readings from Last 3 Encounters:  06/13/17 149 lb 12.8 oz (67.9 kg)  05/23/17 153 lb (69.4 kg)  03/30/17 152 lb (68.9 kg)     Studies/Labs Reviewed:   EKG:  EKG is not ordered today.   Recent Labs: 03/31/2017: ALT 15 05/10/2017: BUN 15; Creatinine, Ser 0.98; Potassium 4.1; Sodium 141   Lipid Panel    Component Value Date/Time   CHOL 153 03/31/2017 1021   TRIG 101 03/31/2017 1021   HDL 67 03/31/2017 1021   CHOLHDL 2.3 03/31/2017 1021   CHOLHDL 2.5 06/24/2014 1058   VLDL 13 06/24/2014 1058   LDLCALC 66 03/31/2017 1021    Additional studies/ records that were reviewed today include:   Echocardiogram: 05/30/2017 Study Conclusions  - Left ventricle: The cavity size was normal. Wall thickness was   increased in a pattern of moderate LVH. Systolic function was   vigorous. The estimated ejection fraction was in the range of 65%   to 70%. Diastolic function is abnormal, indeterminant grade. Wall   motion was normal; there were no regional wall motion    abnormalities. - Aortic valve: Valve area (VTI): 2.3 cm^2. Valve area (Vmax): 2.44   cm^2. - Mitral valve: There was mild regurgitation. - Technically adequate study.  Assessment:    1. Essential hypertension   2. LVH (left ventricular hypertrophy)   3. Mixed hyperlipidemia   4. Type 2 diabetes mellitus without complication, with long-term current use of insulin ([place])      Plan:   In order of problems listed above:  1. HTN - recently evaluated by Dr. Domenic Polite for uncontrolled HTN and was continued on Losartan and Toprol-XL with HCTZ being transitioned to Spironolactone 25 mg daily. - BP is initially elevated at 200/118 during today's visit, improved to 188/102 on recheck. She has been taking decongestant medications regularly.  - we reviewed the association between elevated BP and decongestants. Recommended she use plain Mucinex as needed. Will continue on Losartan 100mg  daily, Toprol-XL 100mg  daily, and increase Spironolactone to 50mg  daily. Will need repeat BMET at time of next visit. Just checked on 2/21 and showed stable kidney function and electrolyte levels.  - she will continue to monitor BP at home and call back with readings in two weeks. If BP still elevated, would consider switching from Toprol-XL to Coreg at that time for improved BP control.   2. Abnormal EKG/ Left Ventricular Hypertrophy - recent EKG showed LVH with associated repolarization abnormalities. An echocardiogram was therefore obtained and showed a preserved EF of 65-70% with no regional WMA. Was noted to have moderate LVH.  - echocardiogram reviewed in detail with the patient today.  - will continue with BP control as outlined above.   3. HLD - FLP in 03/2017 showed total cholesterol of 153, HDL 67, Triglycerides 101, and LDL 66. - continue Lovastatin 20mg  daily.   4. Type 2 DM - Hgb A1c at 5.9 in 03/2017. - followed by PCP.     Medication Adjustments/Labs and Tests Ordered: Current medicines are  reviewed at length with the patient today.  Concerns regarding medicines are outlined above.  Medication changes, Labs and Tests ordered today are listed in the Patient Instructions below. Patient Instructions  Medication Instructions:  Your physician has recommended you make the following  change in your medication: Increase Spironolactone to 50 mg Daily   Labwork: NONE   Testing/Procedures: NONE   Follow-Up: Your physician recommends that you schedule a follow-up appointment in: 4-5 Weeks   Any Other Special Instructions Will Be Listed Below (If Applicable). Your physician has requested that you regularly monitor and record your blood pressure readings at home for 2 two weeks. Please use the same machine at the same time of day to check your readings and record them to bring to your follow-up visit.   If you need a refill on your cardiac medications before your next appointment, please call your pharmacy. Thank you for choosing [facility]!     Signed, Erma Heritage, PA-C  06/13/2017 2:20 PM    [facility]. [address] [place], [place] [address] Phone: [phone]

## 2017-06-12 ENCOUNTER — Other Ambulatory Visit: Payer: Self-pay | Admitting: Nurse Practitioner

## 2017-06-12 ENCOUNTER — Other Ambulatory Visit: Payer: Self-pay | Admitting: Cardiology

## 2017-06-13 ENCOUNTER — Telehealth: Payer: Self-pay

## 2017-06-13 ENCOUNTER — Encounter: Payer: Self-pay | Admitting: Student

## 2017-06-13 ENCOUNTER — Ambulatory Visit (INDEPENDENT_AMBULATORY_CARE_PROVIDER_SITE_OTHER): Payer: Medicare Other | Admitting: Student

## 2017-06-13 VITALS — BP 188/102 | HR 74 | Ht 62.0 in | Wt 149.8 lb

## 2017-06-13 DIAGNOSIS — I517 Cardiomegaly: Secondary | ICD-10-CM

## 2017-06-13 DIAGNOSIS — I10 Essential (primary) hypertension: Secondary | ICD-10-CM | POA: Diagnosis not present

## 2017-06-13 DIAGNOSIS — Z794 Long term (current) use of insulin: Secondary | ICD-10-CM

## 2017-06-13 DIAGNOSIS — E119 Type 2 diabetes mellitus without complications: Secondary | ICD-10-CM

## 2017-06-13 DIAGNOSIS — E782 Mixed hyperlipidemia: Secondary | ICD-10-CM | POA: Diagnosis not present

## 2017-06-13 NOTE — Telephone Encounter (Signed)
-----   Message from Merlene Laughter, LPN sent at 0/76/8088 11:48 AM EST -----   ----- Message ----- From: Satira Sark, MD Sent: 06/13/2017  11:41 AM To: Mikey Kirschner, MD, Merlene Laughter, LPN  Results reviewed.  Potassium and renal function normal range. A copy of this test should be forwarded to Mikey Kirschner, MD.

## 2017-06-13 NOTE — Telephone Encounter (Signed)
Patient notified. Routed to PCP 

## 2017-06-13 NOTE — Patient Instructions (Signed)
Medication Instructions:  Your physician has recommended you make the following change in your medication: Increase Spironolactone to 50 mg Daily    Labwork: NONE   Testing/Procedures: NONE   Follow-Up: Your physician recommends that you schedule a follow-up appointment in: 4-6 Weeks    Any Other Special Instructions Will Be Listed Below (If Applicable). Your physician has requested that you regularly monitor and record your blood pressure readings at home for 2 two weeks. Please use the same machine at the same time of day to check your readings and record them to bring to your follow-up visit.      If you need a refill on your cardiac medications before your next appointment, please call your pharmacy. Thank you for choosing [facility]!

## 2017-06-15 ENCOUNTER — Telehealth: Payer: Self-pay | Admitting: Student

## 2017-06-15 ENCOUNTER — Other Ambulatory Visit: Payer: Self-pay

## 2017-06-15 MED ORDER — SPIRONOLACTONE 50 MG PO TABS: 50.0000 mg | ORAL_TABLET | Freq: Every day | ORAL | 3 refills | Status: DC

## 2017-06-15 NOTE — Telephone Encounter (Signed)
Please send in RX for new dosage of Spironolactone to [facility] / tg

## 2017-06-15 NOTE — Telephone Encounter (Signed)
Sent in new RX for 50 mg spironolactone to [facility]

## 2017-06-19 ENCOUNTER — Telehealth: Payer: Self-pay | Admitting: Student

## 2017-06-27 NOTE — Telephone Encounter (Signed)
Spoke with pt who states that BP has been elevated since last visit. Pt reports BP today to be 180/89 before medication and 165/90 after medication. She c/o feeling dizzy at times and states that it may be coming from the cold that she still has. Pt reports that she stopped taking the decongestant that she was taking. Pt will drop off BP log around 10:30 tomorrow.

## 2017-06-28 MED ORDER — CARVEDILOL 12.5 MG PO TABS: 12.5000 mg | ORAL_TABLET | Freq: Two times a day (BID) | ORAL | 3 refills | Status: DC

## 2017-06-28 NOTE — Telephone Encounter (Signed)
Reviewed BP log. SBP has been consistently elevated in the 170's to 935'T with diastolic readings in the 01'X to low-100's. She is on the max dose of Losartan and we recently increased her Spironolactone which did not help with her readings significantly. She is currently on Toprol-XL 100mg  daily. Would recommend discontinuing this and switching to Coreg for improved BP effect. Would start Coreg at 12.5mg  BID as she is already on a high dose BB. Continue Losartan and Spironolactone at current dosing. Continue to follow BP and report back with results (would be helpful if she could bring the log to her follow-up visit in the coming weeks).   Thanks, Erma Heritage, PA-C 06/28/2017, 11:26 AM

## 2017-06-28 NOTE — Telephone Encounter (Signed)
Patient notified of medication change.

## 2017-06-28 NOTE — Addendum Note (Signed)
Addended by: Levonne Hubert on: 06/28/2017 02:32 PM   Modules accepted: Orders

## 2017-07-05 ENCOUNTER — Telehealth: Payer: Self-pay | Admitting: Student

## 2017-07-05 MED ORDER — CARVEDILOL 12.5 MG PO TABS: 12.5000 mg | ORAL_TABLET | Freq: Two times a day (BID) | ORAL | 3 refills | Status: DC

## 2017-07-05 NOTE — Telephone Encounter (Signed)
carvedilol (COREG) 12.5 MG tablet [[id number]]   needs to be sent to [facility] not longer uses the [facility]

## 2017-07-05 NOTE — Telephone Encounter (Signed)
Refill sent to [facility].

## 2017-07-13 ENCOUNTER — Telehealth: Payer: Self-pay | Admitting: Cardiology

## 2017-07-13 NOTE — Telephone Encounter (Signed)
Per phone note of 06/19/17 pt was to continue losartan and spironolactone along with starting Coreg.She stopped spironolactone, please clarify

## 2017-07-13 NOTE — Telephone Encounter (Signed)
Ok to leave message per pt,confirmed she should be on all three meds listed by [place]

## 2017-07-13 NOTE — Telephone Encounter (Signed)
pt is confused about her medications and would like someone to give her a call. Can be reached @ [phone]

## 2017-07-13 NOTE — Telephone Encounter (Signed)
She should be on:  Losartan 100mg  daily Spironolactone 50mg  daily Coreg 12.5mg  BID  Unless SBP has been less than 140 since starting Coreg, she should restart Spironolactone.   Thanks, Erma Heritage, PA-C 07/13/2017, 1:11 PM

## 2017-07-20 NOTE — Progress Notes (Signed)
Cardiology Office Note  Date: 07/21/2017   ID: [name], DOB 06/28/1950, MRN [id number]  PCP: Mikey Kirschner, MD  Primary Cardiologist: Rozann Lesches, MD   Chief Complaint  Patient presents with  . Hypertension     History of Present Illness: Bianca Arroyo is a 67 y.o. female last seen by Ms. Strader PA-C in February.  She presents for a follow-up visit today. At the last visit she was continued on losartan and Toprol-XL with an increase in dose of Aldactone to 50 mg daily.  Blood pressure is still not optimally controlled.  Today's initial measurements look good, these were then rechecked along with her home blood pressure cuff with systolics in the 884-166 range and diastolics 063-016.  She has had no chest pain or shortness of breath.  I reviewed her medications which are outlined below.  Follow-up lab work from February 22 showed BUN 12, creatinine 0.99, and potassium 4.4.  Past Medical History:  Diagnosis Date  . H/O echocardiogram    a. 05/2017: echo showing EF of 65-70%, no regional WMA, moderate LVH, and mild MR.   Marland Kitchen Hyperlipidemia   . Hypertension   . Type 2 diabetes mellitus ([place])     Past Surgical History:  Procedure Laterality Date  . CESAREAN SECTION    . COLONOSCOPY    . COLONOSCOPY N/A 12/06/2013   Procedure: COLONOSCOPY;  Surgeon: Danie Binder, MD;  [facility];  Service: Endoscopy;  Laterality: N/A;  9:30 AM  . Left wrist surgery    . Right trigger thumb  2013    Current Outpatient Medications  Medication Sig Dispense Refill  . albuterol (PROVENTIL HFA;VENTOLIN HFA) 108 (90 BASE) MCG/ACT inhaler Inhale 2 puffs into the lungs every 6 (six) hours as needed for wheezing or shortness of breath. (Patient taking differently: Inhale 2 puffs into the lungs every 4 (four) hours as needed for wheezing or shortness of breath. ) 1 Inhaler 0  . aspirin 81 MG chewable tablet Chew 81 mg by mouth every morning.     . carvedilol (COREG)  12.5 MG tablet Take 1 tablet (12.5 mg total) by mouth 2 (two) times daily. 180 tablet 3  . fluticasone (FLONASE) 50 MCG/ACT nasal spray USE TWO SPRAY(S) IN EACH NOSTRIL ONCE DAILY 16 g 1  . glipiZIDE (GLUCOTROL) 5 MG tablet TAKE 1 TABLET BY MOUTH TWICE DAILY BEFORE A MEAL 180 tablet 1  . ibuprofen (ADVIL,MOTRIN) 600 MG tablet Take 1 tablet (600 mg total) by mouth every 8 (eight) hours as needed. 30 tablet 0  . ipratropium (ATROVENT) 0.06 % nasal spray Place 1-2 sprays into the nose daily as needed.    Marland Kitchen losartan (COZAAR) 100 MG tablet Take 1 tablet (100 mg total) by mouth daily. 90 tablet 1  . lovastatin (MEVACOR) 20 MG tablet TAKE 1 TABLET (20 MG TOTAL) BY MOUTH DAILY. 90 tablet 1  . Multiple Vitamins-Minerals (ALIVE ONCE DAILY WOMENS 50+ PO) Take 1 tablet by mouth every morning.     Marland Kitchen PRESCRIPTION MEDICATION Testosterone Pellet 50 mg  Q 2-3 months    . PRESCRIPTION MEDICATION Estradiol Pellet 6 mg every 2-3 months    . progesterone (PROMETRIUM) 100 MG capsule Take 100 mg by mouth daily.    Marland Kitchen spironolactone (ALDACTONE) 50 MG tablet Take 1 tablet (50 mg total) by mouth daily. 90 tablet 3  . Tetrahydrozoline HCl (VISINE OP) Apply 1 drop to eye daily as needed (dry eye relief).     Marland Kitchen  valACYclovir (VALTREX) 500 MG tablet TAKE 1 TABLET BY MOUTH TWICE DAILY FOR 3 DAYS AS NEEDED FOR  FLARES 18 tablet 0  . valACYclovir (VALTREX) 500 MG tablet TAKE 1 TABLET BY MOUTH TWICE DAILY FOR 3 DAYS AS NEEDED FOR  FLARES 18 tablet 0  . vitamin B-12 (CYANOCOBALAMIN) 1000 MCG tablet Take 1,000 mcg by mouth every morning.    . carvedilol (COREG) 6.25 MG tablet Take 1 tablet (6.25 mg total) by mouth 2 (two) times daily. 180 tablet 3   No current facility-administered medications for this visit.    Allergies:  Colcrys [colchicine]   Social History: The patient  reports that she has never smoked. She has never used smokeless tobacco. She reports that she drinks about 1.5 oz of alcohol per week. She reports that she  does not use drugs.   ROS:  Please see the history of present illness. Otherwise, complete review of systems is positive for none.  All other systems are reviewed and negative.   Physical Exam: VS:  BP (!) 188/120 Comment: pt's machine  Pulse 72   Ht 5\' 2"  (1.575 m)   Wt 148 lb (67.1 kg)   SpO2 98%   BMI 27.07 kg/m , BMI Body mass index is 27.07 kg/m.  Wt Readings from Last 3 Encounters:  07/21/17 148 lb (67.1 kg)  06/13/17 149 lb 12.8 oz (67.9 kg)  05/23/17 153 lb (69.4 kg)    General: Patient appears comfortable at rest. HEENT: Conjunctiva and lids normal, oropharynx clear. Neck: Supple, no elevated JVP or carotid bruits, no thyromegaly. Lungs: Clear to auscultation, nonlabored breathing at rest. Cardiac: Regular rate and rhythm, no S3 or significant systolic murmur, no pericardial rub. Abdomen: Soft, nontender, bowel sounds present,. Extremities: No pitting edema, distal pulses 2+. Skin: Warm and dry. Musculoskeletal: No kyphosis. Neuropsychiatric: Alert and oriented x3, affect grossly appropriate.  ECG: I personally reviewed the tracing from 05/23/2017 which showed sinus rhythm with LVH and probable repolarization abnormalities.  Recent Labwork: 03/31/2017: ALT 15; AST 23 05/10/2017: BUN 15; Creatinine, Ser 0.98; Potassium 4.1; Sodium 141     Component Value Date/Time   CHOL 153 03/31/2017 1021   TRIG 101 03/31/2017 1021   HDL 67 03/31/2017 1021   CHOLHDL 2.3 03/31/2017 1021   CHOLHDL 2.5 06/24/2014 1058   VLDL 13 06/24/2014 1058   LDLCALC 66 03/31/2017 1021    Other Studies Reviewed Today:  Echocardiogram 05/30/2017: Study Conclusions  - Left ventricle: The cavity size was normal. Wall thickness was   increased in a pattern of moderate LVH. Systolic function was   vigorous. The estimated ejection fraction was in the range of 65%   to 70%. Diastolic function is abnormal, indeterminant grade. Wall   motion was normal; there were no regional wall motion    abnormalities. - Aortic valve: Valve area (VTI): 2.3 cm^2. Valve area (Vmax): 2.44   cm^2. - Mitral valve: There was mild regurgitation. - Technically adequate study.  Assessment and Plan:  1.  Essential hypertension, not optimally controlled.  Continue Cozaar and Aldactone at current doses, increase Coreg to 18.75 mg twice daily.  Nurse visit in 2 weeks for blood pressure recheck and further titration if needed.  2.  Type 2 diabetes mellitus, on Glucotrol.  She continues to follow with Dr. Wolfgang Phoenix.  3.  Mixed hyperlipidemia on Mevacor.  LDL 66.  Current medicines were reviewed with the patient today.   Disposition: Nurse visit in 2 weeks.  Signed, Satira Sark, MD, [facility]  07/21/2017 1:36 PM    [facility] [place] [address] [address], [place], [place] [address] Phone: [phone]; Fax: [phone]

## 2017-07-21 ENCOUNTER — Encounter: Payer: Self-pay | Admitting: Cardiology

## 2017-07-21 ENCOUNTER — Ambulatory Visit (INDEPENDENT_AMBULATORY_CARE_PROVIDER_SITE_OTHER): Payer: Medicare Other | Admitting: Cardiology

## 2017-07-21 VITALS — BP 188/120 | HR 72 | Ht 62.0 in | Wt 148.0 lb

## 2017-07-21 DIAGNOSIS — E119 Type 2 diabetes mellitus without complications: Secondary | ICD-10-CM

## 2017-07-21 DIAGNOSIS — Z794 Long term (current) use of insulin: Secondary | ICD-10-CM

## 2017-07-21 DIAGNOSIS — I10 Essential (primary) hypertension: Secondary | ICD-10-CM

## 2017-07-21 DIAGNOSIS — E782 Mixed hyperlipidemia: Secondary | ICD-10-CM | POA: Diagnosis not present

## 2017-07-21 MED ORDER — CARVEDILOL 6.25 MG PO TABS: 6.2500 mg | ORAL_TABLET | Freq: Two times a day (BID) | ORAL | 3 refills | Status: DC

## 2017-07-21 NOTE — Patient Instructions (Signed)
Your physician recommends that you schedule a follow-up appointment in:  2 weeks for BP check with nurse    INCREASE Coreg to 12.5 mg twice a day AND Coreg 6.25 mg twice a day ( total 18.75 mg twice a day)     No lab work or tests ordered today.      Thank you for choosing [facility] !

## 2017-07-28 ENCOUNTER — Other Ambulatory Visit: Payer: Self-pay | Admitting: Nurse Practitioner

## 2017-08-04 ENCOUNTER — Ambulatory Visit: Payer: Medicare Other

## 2017-08-08 ENCOUNTER — Other Ambulatory Visit: Payer: Self-pay | Admitting: Family Medicine

## 2017-08-08 ENCOUNTER — Ambulatory Visit: Payer: Medicare Other | Admitting: *Deleted

## 2017-08-08 VITALS — BP 190/98 | HR 74 | Wt 150.6 lb

## 2017-08-08 DIAGNOSIS — Z1231 Encounter for screening mammogram for malignant neoplasm of breast: Secondary | ICD-10-CM

## 2017-08-08 DIAGNOSIS — I10 Essential (primary) hypertension: Secondary | ICD-10-CM

## 2017-08-08 NOTE — Progress Notes (Signed)
Noted.  Would increase Coreg to 25 mg twice daily and continue other regimen.

## 2017-08-08 NOTE — Patient Instructions (Signed)
Medication Instructions:  Your physician recommends that you continue on your current medications as directed. Please refer to the Current Medication list given to you today.   Labwork: NONE   Testing/Procedures: NONE   Follow-Up: Your physician recommends that you schedule a follow-up appointment as planned.    Any Other Special Instructions Will Be Listed Below (If Applicable).     If you need a refill on your cardiac medications before your next appointment, please call your pharmacy.  Thank you for choosing [facility]!   

## 2017-08-08 NOTE — Progress Notes (Signed)
Pt states that she feels the same. She does feel dizzy at times but states it is only every now and then. Pt states that she did walk a [place] walk in the [place] today. She was able to complete 5K with out difficultly. Please advise.

## 2017-08-12 ENCOUNTER — Encounter: Payer: Self-pay | Admitting: Nurse Practitioner

## 2017-08-12 ENCOUNTER — Encounter: Payer: Self-pay | Admitting: Family Medicine

## 2017-08-16 ENCOUNTER — Other Ambulatory Visit: Payer: Self-pay | Admitting: Family Medicine

## 2017-08-16 ENCOUNTER — Ambulatory Visit (HOSPITAL_COMMUNITY)
Admission: RE | Admit: 2017-08-16 | Discharge: 2017-08-16 | Disposition: A | Discharge status: Home / Self Care | Payer: Medicare Other | Source: Ambulatory Visit | Attending: Family Medicine | Admitting: Family Medicine

## 2017-08-16 ENCOUNTER — Encounter (HOSPITAL_COMMUNITY): Payer: Self-pay

## 2017-08-16 DIAGNOSIS — Z1231 Encounter for screening mammogram for malignant neoplasm of breast: Secondary | ICD-10-CM | POA: Diagnosis not present

## 2017-08-16 MED ORDER — ALBUTEROL SULFATE HFA 108 (90 BASE) MCG/ACT IN AERS: 2.0000 | INHALATION_SPRAY | Freq: Four times a day (QID) | RESPIRATORY_TRACT | 0 refills | Status: DC | PRN

## 2017-08-17 ENCOUNTER — Telehealth: Payer: Self-pay | Admitting: *Deleted

## 2017-08-17 MED ORDER — CARVEDILOL 25 MG PO TABS: 25.0000 mg | ORAL_TABLET | Freq: Two times a day (BID) | ORAL | 3 refills | Status: DC

## 2017-08-17 NOTE — Telephone Encounter (Signed)
Returning call.

## 2017-08-17 NOTE — Telephone Encounter (Signed)
-----   Message from Satira Sark, MD sent at 08/08/2017  4:56 PM EDT -----   ----- Message ----- From: Levonne Hubert, LPN Sent: 0/37/0488   4:36 PM To: Satira Sark, MD

## 2017-08-17 NOTE — Telephone Encounter (Signed)
Patient notified and voiced understanding.

## 2017-08-17 NOTE — Telephone Encounter (Signed)
Called pt, no answer. LMTCB

## 2017-08-22 ENCOUNTER — Encounter: Payer: Self-pay | Admitting: Family Medicine

## 2017-08-22 ENCOUNTER — Ambulatory Visit (INDEPENDENT_AMBULATORY_CARE_PROVIDER_SITE_OTHER): Payer: Medicare Other | Admitting: Family Medicine

## 2017-08-22 VITALS — BP 138/90 | Temp 97.7°F | Ht 62.0 in | Wt 146.0 lb

## 2017-08-22 DIAGNOSIS — J4521 Mild intermittent asthma with (acute) exacerbation: Secondary | ICD-10-CM

## 2017-08-22 DIAGNOSIS — J329 Chronic sinusitis, unspecified: Secondary | ICD-10-CM

## 2017-08-22 MED ORDER — AMOXICILLIN 500 MG PO CAPS: 500.0000 mg | ORAL_CAPSULE | Freq: Three times a day (TID) | ORAL | 0 refills | Status: DC

## 2017-08-22 MED ORDER — PREDNISONE 10 MG PO TABS: ORAL_TABLET | ORAL | 0 refills | Status: DC

## 2017-08-22 NOTE — Progress Notes (Signed)
   Subjective:    Patient ID: Bianca Arroyo, female    DOB: 1951-01-04, 67 y.o.   MRN: [id number]  Sinusitis  Episode onset: 6-9 days. Associated symptoms include congestion, coughing and headaches. (Wheezing ) Treatments tried: inhaler, cold meds, cough syrup, nasal spray.    Pt felt bad, felt hot and sweaty  Bad case of diarrhea  Felt dizzy and not well    then developed further cold symtoms  Chest congestion and wheezing  Taking cold meds prn   all cong and stuffy   feeling so b with exrtio     Now porr taste and smell    Last used the inhaler two tw this mor   Review of Systems  HENT: Positive for congestion.   Respiratory: Positive for cough.   Neurological: Positive for headaches.       Objective:   Physical Exam   Alert, mild malaise. Hydration good Vitals stable. frontal/ maxillary tenderness evident positive nasal congestion. pharynx normal neck supple  lungs clear/no crackles mild diffuse wheezes. heart regular in rhythm      Assessment & Plan:  Impression rhinosinusitis with exacerbation of reactive airways likely post viral, discussed with patient. plan antibiotics prescribed.  Prednisone prescribed also questions answered. Symptomatic care discussed. warning signs discussed. WSL

## 2017-09-26 ENCOUNTER — Ambulatory Visit (INDEPENDENT_AMBULATORY_CARE_PROVIDER_SITE_OTHER): Payer: Medicare Other | Admitting: Family Medicine

## 2017-09-26 ENCOUNTER — Encounter: Payer: Self-pay | Admitting: Family Medicine

## 2017-09-26 VITALS — BP 188/98 | Ht 62.0 in | Wt 146.0 lb

## 2017-09-26 DIAGNOSIS — Z79899 Other long term (current) drug therapy: Secondary | ICD-10-CM

## 2017-09-26 DIAGNOSIS — E1100 Type 2 diabetes mellitus with hyperosmolarity without nonketotic hyperglycemic-hyperosmolar coma (NKHHC): Secondary | ICD-10-CM | POA: Diagnosis not present

## 2017-09-26 DIAGNOSIS — I10 Essential (primary) hypertension: Secondary | ICD-10-CM | POA: Diagnosis not present

## 2017-09-26 DIAGNOSIS — E785 Hyperlipidemia, unspecified: Secondary | ICD-10-CM

## 2017-09-26 DIAGNOSIS — Z794 Long term (current) use of insulin: Secondary | ICD-10-CM

## 2017-09-26 DIAGNOSIS — Z13220 Encounter for screening for lipoid disorders: Secondary | ICD-10-CM

## 2017-09-26 LAB — POCT GLYCOSYLATED HEMOGLOBIN (HGB A1C): Hemoglobin A1C: 5.5 % (ref 4.0–5.6)

## 2017-09-26 MED ORDER — GLIPIZIDE 5 MG PO TABS: ORAL_TABLET | ORAL | 1 refills | Status: DC

## 2017-09-26 MED ORDER — AMLODIPINE BESYLATE 2.5 MG PO TABS: ORAL_TABLET | ORAL | 1 refills | Status: DC

## 2017-09-26 MED ORDER — LOVASTATIN 20 MG PO TABS: ORAL_TABLET | ORAL | 1 refills | Status: DC

## 2017-09-26 MED ORDER — LOSARTAN POTASSIUM 100 MG PO TABS: 100.0000 mg | ORAL_TABLET | Freq: Every day | ORAL | 1 refills | Status: DC

## 2017-09-26 NOTE — Progress Notes (Signed)
   Subjective:    Patient ID: Bianca Arroyo, female    DOB: [birth year], 67 y.o.   MRN: [id number] Patient arrives with numerous concerns HPI  Patient is here today to follow up on her chronic illnesses.She takes Glipizide 5 mg Bid. She tries to eat healthy.She gets plenty of exercise. She does not see any specialists.  Review of Systems Results for orders placed or performed in visit on 09/26/17  POCT glycosylated hemoglobin (Hb A1C)  Result Value Ref Range   Hemoglobin A1C 5.5 4.0 - 5.6 %   HbA1c, POC (prediabetic range)  5.7 - 6.4 %   HbA1c, POC (controlled diabetic range)  0.0 - 7.0 %   Blood pressure medicine and blood pressure levels reviewed today with patient. Compliant with blood pressure medicine. States does not miss a dose. No obvious side effects. Blood pressure generally good when checked elsewhere. Watching salt intake.  Patient claims compliance with diabetes medication. No obvious side effects. Reports no substantial low sugar spells. Most numbers are generally in good range when checked fasting. Generally does not miss a dose of medication. Watching diabetic diet closely  Patient continues to take lipid medication regularly. No obvious side effects from it. Generally does not miss a dose. Prior blood work results are reviewed with patient. Patient continues to work on fat intake in diet  No headache, no major weight loss or weight gain, no chest pain no back pain abdominal pain no change in bowel habits complete ROS otherwise negative   Glu generally running good, for the most   B minus, trying tostay active cardio and danc sev times per   BP  170s 180s190s over 90 ish    Objective:   Physical Exam  Alert and oriented, vitals reviewed and stable, NAD ENT-TM's and ext canals WNL bilat via otoscopic exam Soft palate, tonsils and post pharynx WNL via oropharyngeal exam Neck-symmetric, no masses; thyroid nonpalpable and nontender Pulmonary-no tachypnea or  accessory muscle use; Clear without wheezes via auscultation Card--no abnrml murmurs, rhythm reg and rate WNL Carotid pulses symmetric, without bruits       Assessment & Plan:  1 impression type 2 diabetes.  Good control.  Discussed.  To maintain same intervention diet exercise discussed  2.  Hyperlipidemia.  Prior blood results discussed.  Compliance encouraged.  Patient to maintain same medication  3.  Hypertension.  Suboptimal control ongoing.  Not good control today.  We sent to cardiologist, patient not pleased with cardiologist attention to her blood pressure issues at this time, we will press on and set up referral towards nephrologist Dr. Lowanda Foster

## 2017-09-28 DIAGNOSIS — Z13220 Encounter for screening for lipoid disorders: Secondary | ICD-10-CM | POA: Diagnosis not present

## 2017-09-28 DIAGNOSIS — Z79899 Other long term (current) drug therapy: Secondary | ICD-10-CM | POA: Diagnosis not present

## 2017-09-29 LAB — LIPID PANEL
CHOLESTEROL TOTAL: 160 mg/dL (ref 100–199)
Chol/HDL Ratio: 2.1 ratio (ref 0.0–4.4)
HDL: 77 mg/dL (ref >39)
LDL CALC: 65 mg/dL (ref 0–99)
TRIGLYCERIDES: 89 mg/dL (ref 0–149)
VLDL Cholesterol Cal: 18 mg/dL (ref 5–40)

## 2017-09-29 LAB — HEPATIC FUNCTION PANEL
ALT: 11 IU/L (ref 0–32)
AST: 16 IU/L (ref 0–40)
Albumin: 4.2 g/dL (ref 3.6–4.8)
Alkaline Phosphatase: 82 IU/L (ref 39–117)
BILIRUBIN, DIRECT: 0.14 mg/dL (ref 0.00–0.40)
Bilirubin Total: 0.6 mg/dL (ref 0.0–1.2)
TOTAL PROTEIN: 7 g/dL (ref 6.0–8.5)

## 2017-10-02 ENCOUNTER — Encounter: Payer: Self-pay | Admitting: Family Medicine

## 2017-10-09 ENCOUNTER — Encounter: Payer: Self-pay | Admitting: Family Medicine

## 2017-10-09 ENCOUNTER — Encounter (INDEPENDENT_AMBULATORY_CARE_PROVIDER_SITE_OTHER): Payer: Self-pay

## 2017-10-26 ENCOUNTER — Ambulatory Visit (INDEPENDENT_AMBULATORY_CARE_PROVIDER_SITE_OTHER): Payer: Medicare Other | Admitting: Family Medicine

## 2017-10-26 ENCOUNTER — Encounter: Payer: Self-pay | Admitting: Family Medicine

## 2017-10-26 VITALS — BP 148/88 | Temp 98.8°F | Ht 62.0 in | Wt 142.4 lb

## 2017-10-26 DIAGNOSIS — J329 Chronic sinusitis, unspecified: Secondary | ICD-10-CM | POA: Diagnosis not present

## 2017-10-26 DIAGNOSIS — J45991 Cough variant asthma: Secondary | ICD-10-CM | POA: Diagnosis not present

## 2017-10-26 DIAGNOSIS — J310 Chronic rhinitis: Secondary | ICD-10-CM

## 2017-10-26 MED ORDER — FLUTICASONE PROPIONATE HFA 44 MCG/ACT IN AERO: INHALATION_SPRAY | RESPIRATORY_TRACT | 5 refills | Status: DC

## 2017-10-26 MED ORDER — AMOXICILLIN-POT CLAVULANATE 875-125 MG PO TABS: ORAL_TABLET | ORAL | 0 refills | Status: DC

## 2017-10-26 MED ORDER — PREDNISONE 10 MG PO TABS: ORAL_TABLET | ORAL | 0 refills | Status: DC

## 2017-10-26 NOTE — Progress Notes (Signed)
   Subjective:    Patient ID: Bianca Arroyo, female    DOB: [birth year], 67 y.o.   MRN: [id number]  Cough  This is a new problem. The current episode started 1 to 4 weeks ago. The cough is productive of sputum. Associated symptoms include a fever, rhinorrhea, a sore throat, shortness of breath, sweats and wheezing. Associated symptoms comments: Abdominal pain, incontinence, post nasal drip. Exacerbated by: seasonal allergies. Treatments tried: inhaler, vicks nasal, cold and flu med, cough syrup, allegra, mucinex, delsym, robutussin  The treatment provided mild relief.    Started with cong and draage  Proceed to bad sob and bad wheezing  In rcent ays using inhaler around every four to six hrs  Low grade fever    ,ild in nte  Review of Systems  Constitutional: Positive for fever.  HENT: Positive for rhinorrhea and sore throat.   Respiratory: Positive for cough, shortness of breath and wheezing.        Objective:   Physical Exam  Alert, mild malaise. Hydration good Vitals stable. frontal/ maxillary tenderness evident positive nasal congestion. pharynx normal neck supple  lungs clear/no crackles, tho positive wheezes. heart regular in rhythm       Assessment & Plan:  Impression rhinosinusitis likely post viral, discussed with patient. plan antibiotics prescribed. Questions answered. Symptomatic care discussed. warning signs discussed. WSL Plus prednisone taper added for substantial exacerbation of wheezing.  With this can second spell in 2-1/2 months.  Will also add Flovent.  Rationale discussed.

## 2017-10-30 ENCOUNTER — Encounter: Payer: Self-pay | Admitting: Nurse Practitioner

## 2017-10-30 ENCOUNTER — Ambulatory Visit (INDEPENDENT_AMBULATORY_CARE_PROVIDER_SITE_OTHER): Payer: Medicare Other | Admitting: Nurse Practitioner

## 2017-10-30 VITALS — BP 152/94 | Ht 62.0 in | Wt 142.8 lb

## 2017-10-30 DIAGNOSIS — Z01419 Encounter for gynecological examination (general) (routine) without abnormal findings: Secondary | ICD-10-CM

## 2017-10-30 DIAGNOSIS — Z0000 Encounter for general adult medical examination without abnormal findings: Secondary | ICD-10-CM

## 2017-10-30 NOTE — Patient Instructions (Addendum)
Soy Flaxseed  Black cohosh Estroven  [email or web address]

## 2017-11-01 ENCOUNTER — Encounter: Payer: Self-pay | Admitting: Nurse Practitioner

## 2017-11-01 NOTE — Progress Notes (Signed)
   Subjective:    Patient ID: Bianca Arroyo, female    DOB: [birth year], 67 y.o.   MRN: [id number]  HPI presents for her wellness exam.  Had some brief bleeding after being seen at another provider called [facility] who prescribed hormone therapy.  States she was on estrogen and progesterone.  When she had the bleeding, she stopped using the hormones and has had none since then.  No pelvic pain.  No new sexual partners.  Regular vision and dental care.  Regular aerobic activity most days of the week.  Sees cardiology for blood pressure management.    Review of Systems  Constitutional: Negative for activity change, appetite change and fatigue.  HENT: Negative for dental problem, ear pain, sinus pressure and sore throat.   Respiratory: Negative for cough, chest tightness, shortness of breath and wheezing.   Cardiovascular: Negative for chest pain.  Gastrointestinal: Negative for abdominal distention, abdominal pain, blood in stool, constipation, diarrhea, nausea and vomiting.  Genitourinary: Negative for difficulty urinating, dysuria, enuresis, frequency, genital sores, pelvic pain, urgency, vaginal bleeding and vaginal discharge.  Neurological: Negative for facial asymmetry, speech difficulty, weakness and numbness.   Depression screen [facility] 2/9 10/30/2017 10/14/2016 02/23/2015 12/17/2014  Decreased Interest 0 0 0 0  Down, Depressed, Hopeless 1 0 0 0  PHQ - 2 Score 1 0 0 0  Altered sleeping 1 - - -  Tired, decreased energy 0 - - -  Change in appetite 0 - - -  Feeling bad or failure about yourself  0 - - -  Trouble concentrating 1 - - -  Moving slowly or fidgety/restless 0 - - -  Suicidal thoughts 0 - - -  PHQ-9 Score 3 - - -        Objective:   Physical Exam  Constitutional: She is oriented to person, place, and time. She appears well-developed. No distress.  HENT:  Right Ear: External ear normal.  Left Ear: External ear normal.  Mouth/Throat: Oropharynx is clear and moist.  Neck:  Normal range of motion. Neck supple. No tracheal deviation present. No thyromegaly present.  Cardiovascular: Normal rate, regular rhythm and normal heart sounds. Exam reveals no gallop.  No murmur heard. Pulmonary/Chest: Effort normal and breath sounds normal. Right breast exhibits no inverted nipple, no mass, no skin change and no tenderness. Left breast exhibits no inverted nipple, no mass, no skin change and no tenderness. Breasts are symmetrical.  Axillae no adenopathy.  Abdominal: Soft. She exhibits no distension. There is no tenderness.  Genitourinary:  Genitourinary Comments: Defers GU and pelvic exam today.  Denies any problems.  Musculoskeletal: She exhibits no edema.  Lymphadenopathy:    She has no cervical adenopathy.  Neurological: She is alert and oriented to person, place, and time.  Skin: Skin is warm and dry. No rash noted.  Psychiatric: She has a normal mood and affect. Her behavior is normal.          Assessment & Plan:  Well woman exam  Encouraged continued healthy diet, regular exercise and daily vitamin D supplementation.  Advised patient that her BP was elevated today, continue follow-up with cardiology for hypertension management.  Lab work up-to-date.  Discussed safer alternatives for hot flashes including natural supplements. Return in about 1 year (around 10/31/2018) for physical.

## 2017-11-02 ENCOUNTER — Other Ambulatory Visit: Payer: Self-pay | Admitting: Cardiology

## 2017-11-02 ENCOUNTER — Other Ambulatory Visit: Payer: Self-pay | Admitting: Nurse Practitioner

## 2017-11-03 ENCOUNTER — Encounter: Payer: Self-pay | Admitting: Student

## 2017-11-03 ENCOUNTER — Other Ambulatory Visit: Payer: Self-pay | Admitting: Nurse Practitioner

## 2017-11-03 ENCOUNTER — Other Ambulatory Visit: Payer: Self-pay | Admitting: Student

## 2017-11-03 ENCOUNTER — Encounter: Payer: Self-pay | Admitting: Nurse Practitioner

## 2017-11-03 MED ORDER — SPIRONOLACTONE 50 MG PO TABS: 50.0000 mg | ORAL_TABLET | Freq: Every day | ORAL | 3 refills | Status: DC

## 2017-11-03 MED ORDER — AMLODIPINE BESYLATE 5 MG PO TABS: 5.0000 mg | ORAL_TABLET | Freq: Every day | ORAL | 1 refills | Status: DC

## 2017-11-06 ENCOUNTER — Other Ambulatory Visit (HOSPITAL_COMMUNITY): Payer: Self-pay | Admitting: Nephrology

## 2017-11-06 DIAGNOSIS — N183 Chronic kidney disease, stage 3 unspecified: Secondary | ICD-10-CM

## 2017-11-15 DIAGNOSIS — N183 Chronic kidney disease, stage 3 (moderate): Secondary | ICD-10-CM | POA: Diagnosis not present

## 2017-11-15 DIAGNOSIS — Z79899 Other long term (current) drug therapy: Secondary | ICD-10-CM | POA: Diagnosis not present

## 2017-11-15 DIAGNOSIS — D649 Anemia, unspecified: Secondary | ICD-10-CM | POA: Diagnosis not present

## 2017-11-15 DIAGNOSIS — R809 Proteinuria, unspecified: Secondary | ICD-10-CM | POA: Diagnosis not present

## 2017-11-15 DIAGNOSIS — E559 Vitamin D deficiency, unspecified: Secondary | ICD-10-CM | POA: Diagnosis not present

## 2017-11-15 DIAGNOSIS — I10 Essential (primary) hypertension: Secondary | ICD-10-CM | POA: Diagnosis not present

## 2017-11-20 ENCOUNTER — Other Ambulatory Visit: Payer: Self-pay

## 2017-11-21 ENCOUNTER — Ambulatory Visit (HOSPITAL_COMMUNITY)
Admission: RE | Admit: 2017-11-21 | Discharge: 2017-11-21 | Disposition: A | Discharge status: Home / Self Care | Payer: Medicare Other | Source: Ambulatory Visit | Attending: Nephrology | Admitting: Nephrology

## 2017-11-21 DIAGNOSIS — N183 Chronic kidney disease, stage 3 unspecified: Secondary | ICD-10-CM

## 2017-11-29 DIAGNOSIS — R809 Proteinuria, unspecified: Secondary | ICD-10-CM | POA: Diagnosis not present

## 2017-11-29 DIAGNOSIS — D638 Anemia in other chronic diseases classified elsewhere: Secondary | ICD-10-CM | POA: Diagnosis not present

## 2017-11-29 DIAGNOSIS — N182 Chronic kidney disease, stage 2 (mild): Secondary | ICD-10-CM | POA: Diagnosis not present

## 2017-11-29 DIAGNOSIS — E559 Vitamin D deficiency, unspecified: Secondary | ICD-10-CM | POA: Diagnosis not present

## 2017-11-30 IMAGING — DX DG KNEE COMPLETE 4+V*R*
4 series · 4 of 4 positions shown · non-contrast
Comparison: No recent.

CLINICAL DATA: Sharp pain.

EXAM:
RIGHT KNEE - COMPLETE 4+ VIEW

[knee ap]
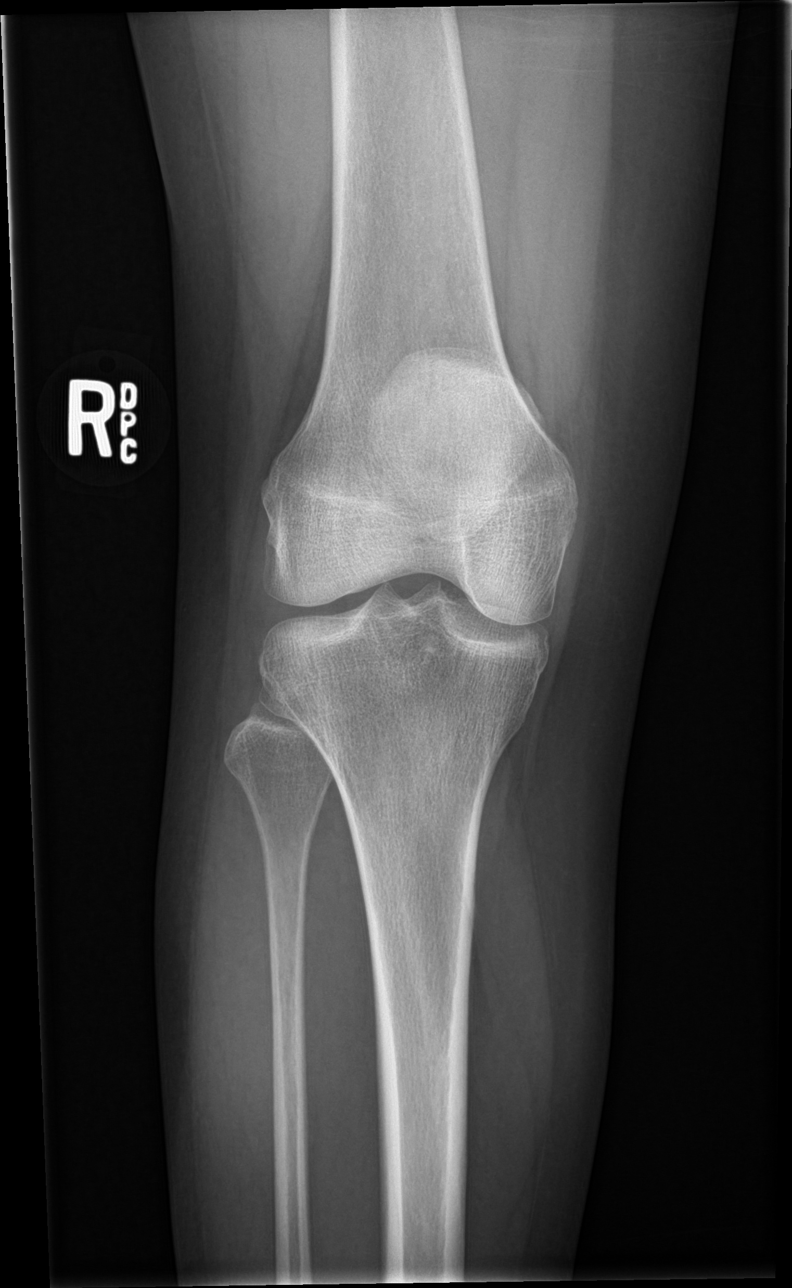

[knee obl (1 of 2)]
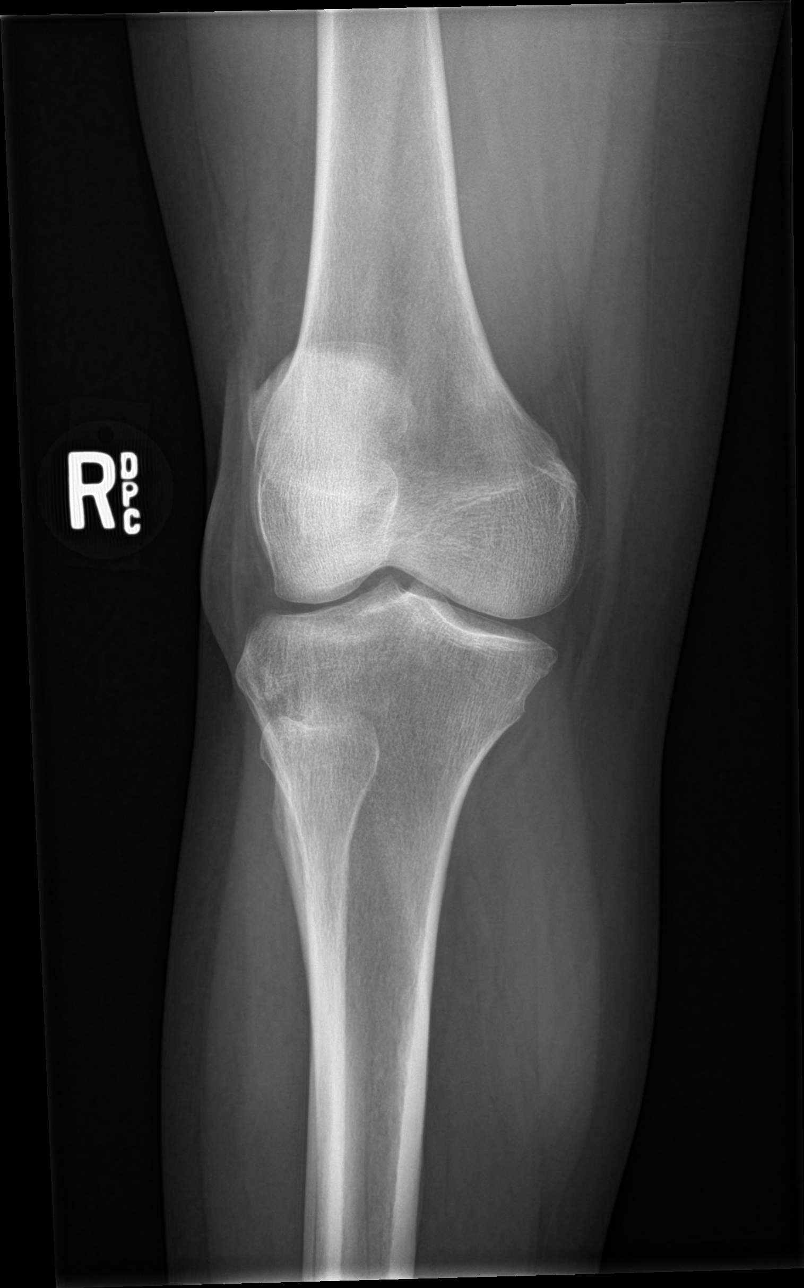

[knee obl (2 of 2)]
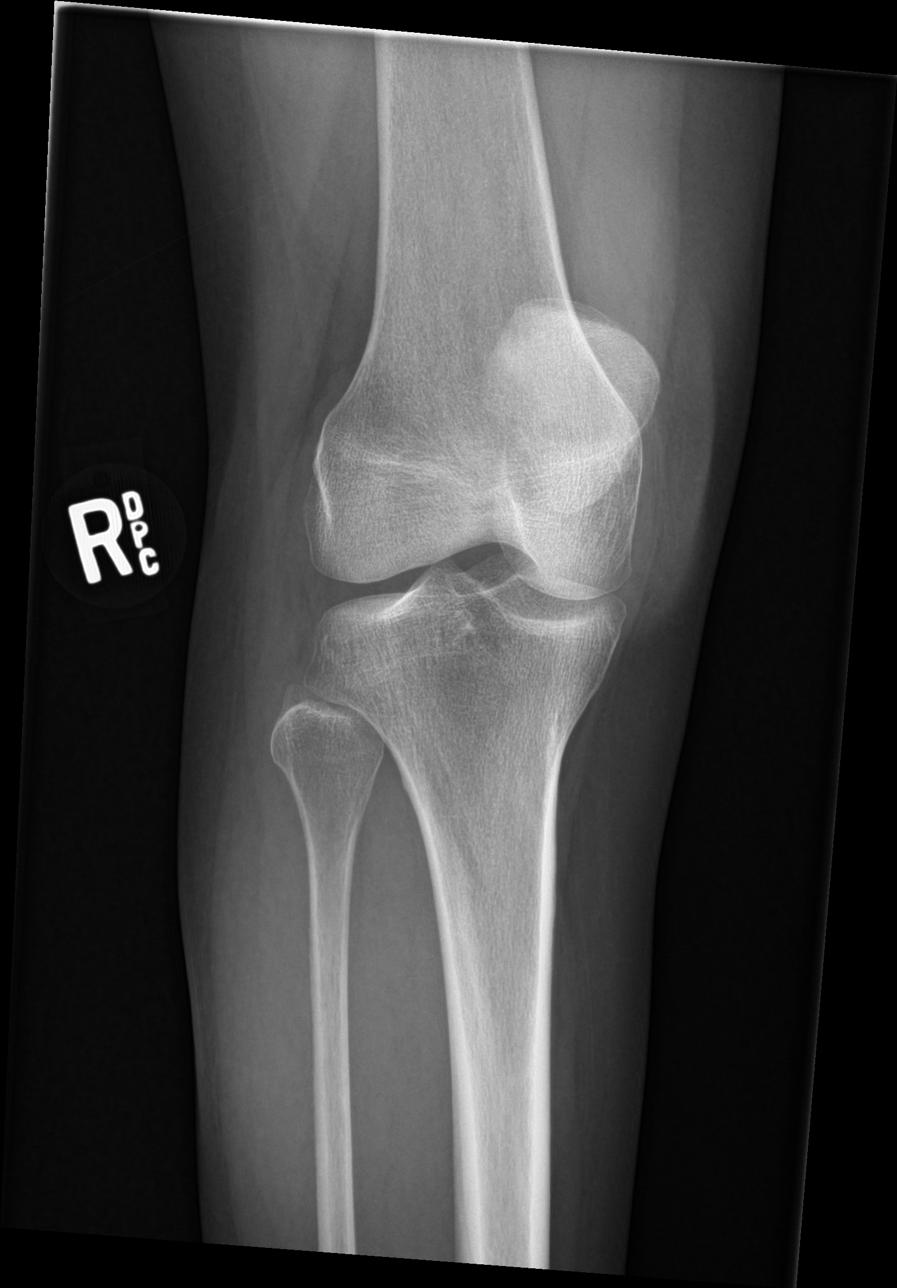

[knee lat]
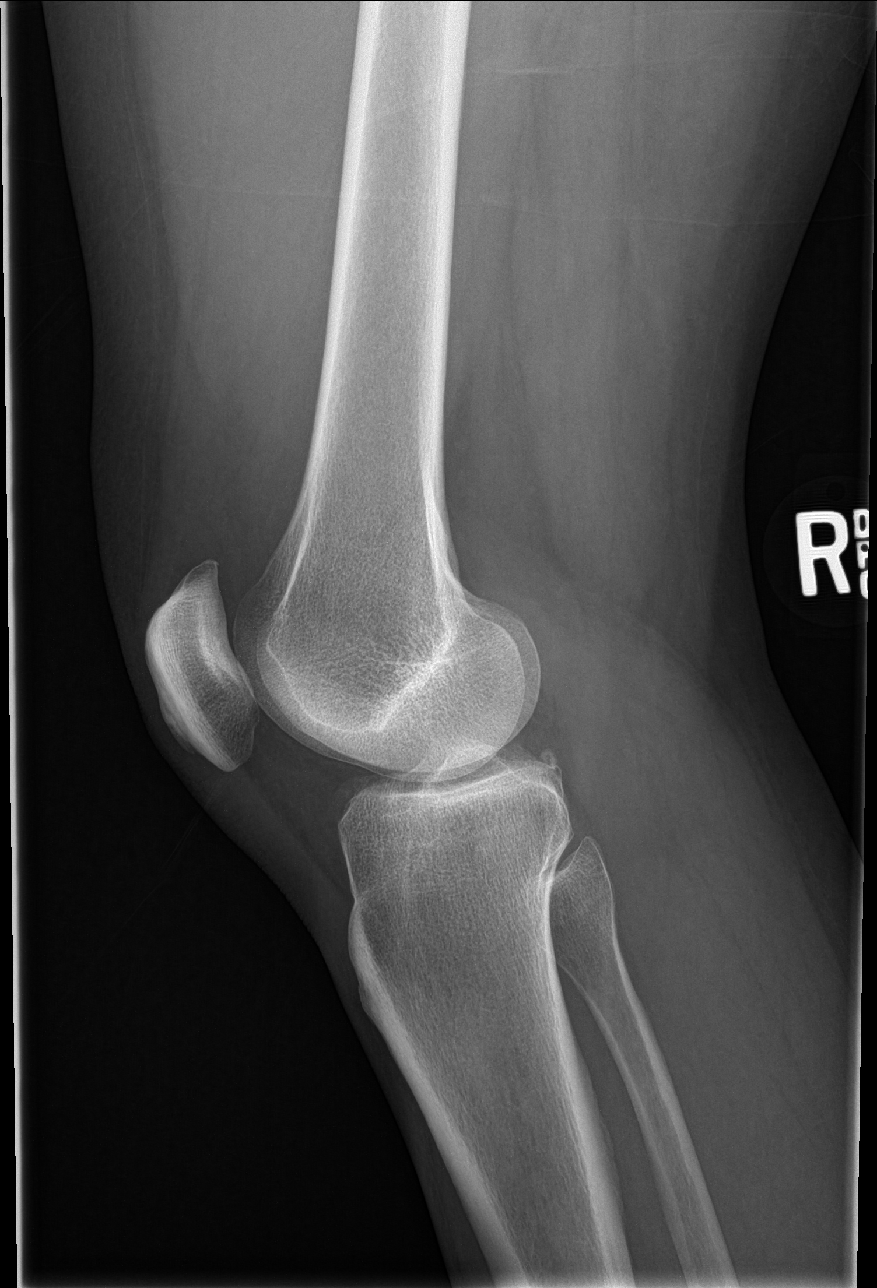

[4 of 4 positions shown; findings below may reference images not displayed]

FINDINGS: No acute bony or joint abnormality identified. No evidence of
fracture or dislocation.
IMPRESSION: No acute abnormality.

## 2017-12-22 ENCOUNTER — Other Ambulatory Visit: Payer: Self-pay | Admitting: *Deleted

## 2017-12-22 MED ORDER — VALACYCLOVIR HCL 500 MG PO TABS: ORAL_TABLET | ORAL | 6 refills | Status: DC

## 2017-12-22 NOTE — Progress Notes (Signed)
May have 6 refills 

## 2017-12-22 NOTE — Progress Notes (Signed)
Medication sent to [facility] in [place]

## 2018-01-02 DIAGNOSIS — E785 Hyperlipidemia, unspecified: Secondary | ICD-10-CM | POA: Diagnosis not present

## 2018-01-02 DIAGNOSIS — I10 Essential (primary) hypertension: Secondary | ICD-10-CM | POA: Diagnosis not present

## 2018-01-02 DIAGNOSIS — G4733 Obstructive sleep apnea (adult) (pediatric): Secondary | ICD-10-CM | POA: Diagnosis not present

## 2018-01-02 DIAGNOSIS — R5383 Other fatigue: Secondary | ICD-10-CM | POA: Diagnosis not present

## 2018-01-05 ENCOUNTER — Encounter (HOSPITAL_BASED_OUTPATIENT_CLINIC_OR_DEPARTMENT_OTHER): Payer: Self-pay

## 2018-01-05 DIAGNOSIS — G4733 Obstructive sleep apnea (adult) (pediatric): Secondary | ICD-10-CM

## 2018-01-09 ENCOUNTER — Ambulatory Visit: Payer: Medicare Other | Attending: Neurology | Admitting: Neurology

## 2018-01-09 DIAGNOSIS — G4733 Obstructive sleep apnea (adult) (pediatric): Secondary | ICD-10-CM

## 2018-01-18 DIAGNOSIS — Z23 Encounter for immunization: Secondary | ICD-10-CM | POA: Diagnosis not present

## 2018-01-20 NOTE — Procedures (Signed)
[place] A. Merlene Laughter, MD     [email or web address]             NOCTURNAL POLYSOMNOGRAPHY   LOCATION: [facility]  Patient Name: Bianca Arroyo, Bianca Arroyo Date: 01/09/2018 Gender: Female D.O.B: 1951-01-17 Age (years): 36 Referring Provider: Barton Fanny NP Height (inches): 62 Interpreting Physician: Phillips Odor MD, ABSM Weight (lbs): 142 RPSGT: Rosebud Poles BMI: 26 MRN: [id number] Neck Size: 14.50 CLINICAL INFORMATION Sleep Study Type: NPSG     Indication for sleep study: N/A     Epworth Sleepiness Score: 14     SLEEP STUDY TECHNIQUE As per the AASM Manual for the Scoring of Sleep and Associated Events v2.3 (April 2016) with a hypopnea requiring 4% desaturations.  The channels recorded and monitored were frontal, central and occipital EEG, electrooculogram (EOG), submentalis EMG (chin), nasal and oral airflow, thoracic and abdominal wall motion, anterior tibialis EMG, snore microphone, electrocardiogram, and pulse oximetry.  MEDICATIONS Medications self-administered by patient taken the night of the study : N/A  Current Outpatient Medications:  .  albuterol (PROVENTIL HFA;VENTOLIN HFA) 108 (90 Base) MCG/ACT inhaler, Inhale 2 puffs into the lungs every 6 (six) hours as needed for wheezing or shortness of breath., Disp: 1 Inhaler, Rfl: 0 .  amLODipine (NORVASC) 5 MG tablet, Take 1 tablet (5 mg total) by mouth daily., Disp: 90 tablet, Rfl: 1 .  amoxicillin (AMOXIL) 500 MG capsule, Take 1 capsule (500 mg total) by mouth 3 (three) times daily., Disp: 30 capsule, Rfl: 0 .  amoxicillin-clavulanate (AUGMENTIN) 875-125 MG tablet, Take one tablet twice daily for 10 days, Disp: 20 tablet, Rfl: 0 .  aspirin 81 MG chewable tablet, Chew 81 mg by mouth every morning. , Disp: , Rfl:  .  carvedilol (COREG) 25 MG tablet, Take 1 tablet (25 mg total) by mouth 2 (two) times daily., Disp: 180 tablet, Rfl: 3 .  fluticasone (FLONASE) 50 MCG/ACT nasal spray, USE  TWO SPRAY(S) IN EACH NOSTRIL ONCE DAILY, Disp: 16 g, Rfl: 1 .  fluticasone (FLOVENT HFA) 44 MCG/ACT inhaler, Two puffs twice daily, Disp: 1 Inhaler, Rfl: 5 .  glipiZIDE (GLUCOTROL) 5 MG tablet, TAKE 1 TABLET BY MOUTH TWICE DAILY BEFORE A MEAL, Disp: 180 tablet, Rfl: 1 .  ibuprofen (ADVIL,MOTRIN) 600 MG tablet, Take 1 tablet (600 mg total) by mouth every 8 (eight) hours as needed., Disp: 30 tablet, Rfl: 0 .  ipratropium (ATROVENT) 0.06 % nasal spray, Place 1-2 sprays into the nose daily as needed., Disp: , Rfl:  .  losartan (COZAAR) 100 MG tablet, Take 1 tablet (100 mg total) by mouth daily., Disp: 90 tablet, Rfl: 1 .  lovastatin (MEVACOR) 20 MG tablet, TAKE 1 TABLET (20 MG TOTAL) BY MOUTH DAILY., Disp: 90 tablet, Rfl: 1 .  Multiple Vitamins-Minerals (ALIVE ONCE DAILY WOMENS 50+ PO), Take 1 tablet by mouth every morning. , Disp: , Rfl:  .  predniSONE (DELTASONE) 10 MG tablet, Four qd for two d, three qd for two  D, two qd for two d, Disp: 18 tablet, Rfl: 0 .  predniSONE (DELTASONE) 10 MG tablet, Take 4 a day for 3 days, then 3 a day for 3 days and then 2 a day for 2 days, Disp: 25 tablet, Rfl: 0 .  PROVENTIL HFA 108 (90 Base) MCG/ACT inhaler, INHALE TWO PUFFS BY MOUTH EVERY 6 HOURS AS NEEDED FOR WHEEZING OR SHORTNESS OF BREATH, Disp: 7 each, Rfl: 0 .  spironolactone (ALDACTONE) 50 MG tablet, Take 1 tablet (50 mg total) by mouth daily.,  Disp: 90 tablet, Rfl: 3 .  Tetrahydrozoline HCl (VISINE OP), Apply 1 drop to eye daily as needed (dry eye relief). , Disp: , Rfl:  .  valACYclovir (VALTREX) 500 MG tablet, TAKE 1 TABLET BY MOUTH TWICE DAILY AS NEEDED FOR 3 DAYS FOR FLARES, Disp: 18 tablet, Rfl: 6 .  vitamin B-12 (CYANOCOBALAMIN) 1000 MCG tablet, Take 1,000 mcg by mouth every morning., Disp: , Rfl:      SLEEP ARCHITECTURE The study was initiated at 10:41:30 PM and ended at 5:11:25 AM.  Sleep onset time was 13.7 minutes and the sleep efficiency was 92.1%%. The total sleep time was 359.2  minutes.  Stage REM latency was 114.5 minutes.  The patient spent 7.2%% of the night in stage N1 sleep, 73.1%% in stage N2 sleep, 0.0%% in stage N3 and 19.6% in REM.  Alpha intrusion was absent.  Supine sleep was 84.97%.  RESPIRATORY PARAMETERS The overall apnea/hypopnea index (AHI) was 16.0 per hour. There were 19 total apneas, including 19 obstructive, 0 central and 0 mixed apneas. There were 77 hypopneas and 34 RERAs.  The AHI during Stage REM sleep was 57.0 per hour.  AHI while supine was 18.9 per hour.  The mean oxygen saturation was 94.9%. The minimum SpO2 during sleep was 81.0%.  moderate snoring was noted during this study.  CARDIAC DATA The 2 lead EKG demonstrated sinus rhythm. The mean heart rate was 59.6 beats per minute. Other EKG findings include: None. LEG MOVEMENT DATA The total PLMS were 0 with a resulting PLMS index of 0.0. Associated arousal with leg movement index was 0.0.  IMPRESSIONS 1. Moderate obstructive sleep apnea is noted with study. A formal CPAP titration study is recommended. 2. Absent slow wave sleep is observed.    Delano Metz, MD Diplomate, American Board of Sleep Medicine. ELECTRONICALLY SIGNED ON:  01/20/2018, 5:07 PM [place] PH: [phone]   FX: [phone] [place]

## 2018-02-21 DIAGNOSIS — H04123 Dry eye syndrome of bilateral lacrimal glands: Secondary | ICD-10-CM | POA: Diagnosis not present

## 2018-02-27 DIAGNOSIS — I10 Essential (primary) hypertension: Secondary | ICD-10-CM | POA: Diagnosis not present

## 2018-02-27 DIAGNOSIS — G4733 Obstructive sleep apnea (adult) (pediatric): Secondary | ICD-10-CM | POA: Diagnosis not present

## 2018-02-27 DIAGNOSIS — E785 Hyperlipidemia, unspecified: Secondary | ICD-10-CM | POA: Diagnosis not present

## 2018-02-27 DIAGNOSIS — R5383 Other fatigue: Secondary | ICD-10-CM | POA: Diagnosis not present

## 2018-02-28 ENCOUNTER — Other Ambulatory Visit (HOSPITAL_BASED_OUTPATIENT_CLINIC_OR_DEPARTMENT_OTHER): Payer: Self-pay

## 2018-02-28 DIAGNOSIS — G4733 Obstructive sleep apnea (adult) (pediatric): Secondary | ICD-10-CM

## 2018-03-08 ENCOUNTER — Ambulatory Visit: Payer: Medicare Other | Attending: Neurology | Admitting: Neurology

## 2018-03-08 DIAGNOSIS — Z7984 Long term (current) use of oral hypoglycemic drugs: Secondary | ICD-10-CM | POA: Diagnosis not present

## 2018-03-08 DIAGNOSIS — Z791 Long term (current) use of non-steroidal anti-inflammatories (NSAID): Secondary | ICD-10-CM | POA: Diagnosis not present

## 2018-03-08 DIAGNOSIS — Z79899 Other long term (current) drug therapy: Secondary | ICD-10-CM | POA: Diagnosis not present

## 2018-03-08 DIAGNOSIS — G4733 Obstructive sleep apnea (adult) (pediatric): Secondary | ICD-10-CM | POA: Diagnosis not present

## 2018-03-08 DIAGNOSIS — Z7982 Long term (current) use of aspirin: Secondary | ICD-10-CM | POA: Insufficient documentation

## 2018-03-20 NOTE — Procedures (Signed)
[place] A. Merlene Laughter, MD     [email or web address]             NOCTURNAL POLYSOMNOGRAPHY   LOCATION: [facility]  Patient Name: Bianca Arroyo, Bianca Arroyo Date: 03/08/2018 Gender: Female D.O.B: Aug 02, 1950 Age (years): 6 Referring Provider: Barton Fanny NP Height (inches): 62 Interpreting Physician: Phillips Odor MD, ABSM Weight (lbs): 142 RPSGT: Peak, Robert BMI: 26 MRN: [id number] Neck Size: 14.50 CLINICAL INFORMATION The patient is referred for a BiPAP titration to treat sleep apnea.  Date of NPSG, Split Night or HST:  SLEEP STUDY TECHNIQUE As per the AASM Manual for the Scoring of Sleep and Associated Events v2.3 (April 2016) with a hypopnea requiring 4% desaturations.  The channels recorded and monitored were frontal, central and occipital EEG, electrooculogram (EOG), submentalis EMG (chin), nasal and oral airflow, thoracic and abdominal wall motion, anterior tibialis EMG, snore microphone, electrocardiogram, and pulse oximetry. Bilevel positive airway pressure (BPAP) was initiated at the beginning of the study and titrated to treat sleep-disordered breathing.  MEDICATIONS Medications self-administered by patient taken the night of the study : N/A  Current Outpatient Medications:  .  albuterol (PROVENTIL HFA;VENTOLIN HFA) 108 (90 Base) MCG/ACT inhaler, Inhale 2 puffs into the lungs every 6 (six) hours as needed for wheezing or shortness of breath., Disp: 1 Inhaler, Rfl: 0 .  amLODipine (NORVASC) 5 MG tablet, Take 1 tablet (5 mg total) by mouth daily., Disp: 90 tablet, Rfl: 1 .  amoxicillin (AMOXIL) 500 MG capsule, Take 1 capsule (500 mg total) by mouth 3 (three) times daily., Disp: 30 capsule, Rfl: 0 .  amoxicillin-clavulanate (AUGMENTIN) 875-125 MG tablet, Take one tablet twice daily for 10 days, Disp: 20 tablet, Rfl: 0 .  aspirin 81 MG chewable tablet, Chew 81 mg by mouth every morning. , Disp: , Rfl:  .  carvedilol (COREG) 25 MG tablet, Take 1  tablet (25 mg total) by mouth 2 (two) times daily., Disp: 180 tablet, Rfl: 3 .  fluticasone (FLONASE) 50 MCG/ACT nasal spray, USE TWO SPRAY(S) IN EACH NOSTRIL ONCE DAILY, Disp: 16 g, Rfl: 1 .  fluticasone (FLOVENT HFA) 44 MCG/ACT inhaler, Two puffs twice daily, Disp: 1 Inhaler, Rfl: 5 .  glipiZIDE (GLUCOTROL) 5 MG tablet, TAKE 1 TABLET BY MOUTH TWICE DAILY BEFORE A MEAL, Disp: 180 tablet, Rfl: 1 .  ibuprofen (ADVIL,MOTRIN) 600 MG tablet, Take 1 tablet (600 mg total) by mouth every 8 (eight) hours as needed., Disp: 30 tablet, Rfl: 0 .  ipratropium (ATROVENT) 0.06 % nasal spray, Place 1-2 sprays into the nose daily as needed., Disp: , Rfl:  .  losartan (COZAAR) 100 MG tablet, Take 1 tablet (100 mg total) by mouth daily., Disp: 90 tablet, Rfl: 1 .  lovastatin (MEVACOR) 20 MG tablet, TAKE 1 TABLET (20 MG TOTAL) BY MOUTH DAILY., Disp: 90 tablet, Rfl: 1 .  Multiple Vitamins-Minerals (ALIVE ONCE DAILY WOMENS 50+ PO), Take 1 tablet by mouth every morning. , Disp: , Rfl:  .  predniSONE (DELTASONE) 10 MG tablet, Four qd for two d, three qd for two  D, two qd for two d, Disp: 18 tablet, Rfl: 0 .  predniSONE (DELTASONE) 10 MG tablet, Take 4 a day for 3 days, then 3 a day for 3 days and then 2 a day for 2 days, Disp: 25 tablet, Rfl: 0 .  PROVENTIL HFA 108 (90 Base) MCG/ACT inhaler, INHALE TWO PUFFS BY MOUTH EVERY 6 HOURS AS NEEDED FOR WHEEZING OR SHORTNESS OF BREATH, Disp: 7 each, Rfl: 0 .  spironolactone (ALDACTONE) 50 MG tablet, Take 1 tablet (50 mg total) by mouth daily., Disp: 90 tablet, Rfl: 3 .  Tetrahydrozoline HCl (VISINE OP), Apply 1 drop to eye daily as needed (dry eye relief). , Disp: , Rfl:  .  valACYclovir (VALTREX) 500 MG tablet, TAKE 1 TABLET BY MOUTH TWICE DAILY AS NEEDED FOR 3 DAYS FOR FLARES, Disp: 18 tablet, Rfl: 6 .  vitamin B-12 (CYANOCOBALAMIN) 1000 MCG tablet, Take 1,000 mcg by mouth every morning., Disp: , Rfl:    RESPIRATORY PARAMETERS Tthe patient was placed on pressures between 5-14.   Higher pressures at 14 cause some difficulties with expiration and therefore bilevel pressures were initiated.  However, she appears to have done best on the pressure of 12.  Optimal IPAP Pressure (cm): 12 AHI at Optimal Pressure (/hr) 2.2 Optimal EPAP Pressure (cm): 12   Overall Minimal O2 (%): 85.0 Minimal O2 at Optimal Pressure (%): 92.0      SLEEP ARCHITECTURE Start Time: 9:52:19 PM Stop Time: 4:14:06 AM Total Time (min): 381.8 Total Sleep Time (min): 330.4 Sleep Latency (min): 8.4 Sleep Efficiency (%): 86.5% REM Latency (min): 163.0 WASO (min): 43.0 Stage N1 (%): 22.4% Stage N2 (%): 62.2% Stage N3 (%): 0.0% Stage R (%): 15.4 Supine (%): 96.22 Arousal Index (/hr): 40.9     CARDIAC DATA The 2 lead EKG demonstrated sinus rhythm. The mean heart rate was N/A beats per minute. Other EKG findings include: None.  LEG MOVEMENT DATA The total Periodic Limb Movements of Sleep (PLMS) were 0. The PLMS index was 0.0. A PLMS index of <15 is considered normal in adults.  IMPRESSIONS The optimal BIPAP selected for this patient are ( 12 / cm of water)   Delano Metz, MD Diplomate, American Board of Sleep Medicine.  ELECTRONICALLY SIGNED ON:  03/20/2018, 10:17 AM [facility] PH: [phone]   FX: [phone] [place]

## 2018-03-27 ENCOUNTER — Other Ambulatory Visit: Payer: Self-pay | Admitting: Family Medicine

## 2018-03-29 ENCOUNTER — Ambulatory Visit (INDEPENDENT_AMBULATORY_CARE_PROVIDER_SITE_OTHER): Payer: Medicare Other | Admitting: Family Medicine

## 2018-03-29 ENCOUNTER — Encounter: Payer: Self-pay | Admitting: Family Medicine

## 2018-03-29 VITALS — BP 124/86 | Ht 62.0 in | Wt 148.6 lb

## 2018-03-29 DIAGNOSIS — E785 Hyperlipidemia, unspecified: Secondary | ICD-10-CM

## 2018-03-29 DIAGNOSIS — Z794 Long term (current) use of insulin: Secondary | ICD-10-CM | POA: Diagnosis not present

## 2018-03-29 DIAGNOSIS — I10 Essential (primary) hypertension: Secondary | ICD-10-CM | POA: Diagnosis not present

## 2018-03-29 DIAGNOSIS — Z79899 Other long term (current) drug therapy: Secondary | ICD-10-CM | POA: Diagnosis not present

## 2018-03-29 DIAGNOSIS — E1100 Type 2 diabetes mellitus with hyperosmolarity without nonketotic hyperglycemic-hyperosmolar coma (NKHHC): Secondary | ICD-10-CM | POA: Diagnosis not present

## 2018-03-29 LAB — POCT GLYCOSYLATED HEMOGLOBIN (HGB A1C): Hemoglobin A1C: 6.3 % — AB (ref 4.0–5.6)

## 2018-03-29 NOTE — Progress Notes (Signed)
   Subjective:    Patient ID: Bianca Arroyo, female    DOB: Mar 11, 1951, 67 y.o.   MRN: [id number]  Diabetes  She presents for her follow-up diabetic visit. She has type 2 diabetes mellitus. There are no hypoglycemic associated symptoms. (Noticed tingling and numbness in left hand (thumb, index and ring finger) about 2 weeks ago.) There are no hypoglycemic complications. There are no diabetic complications. She does not see a podiatrist.Eye exam is current.  Hyperlipidemia  This is a chronic problem. There are no compliance problems.  Risk factors for coronary artery disease include diabetes mellitus.   Results for orders placed or performed in visit on 03/29/18  POCT HgB A1C  Result Value Ref Range   Hemoglobin A1C 6.3 (A) 4.0 - 5.6 %   HbA1c POC (<> result, manual entry)     HbA1c, POC (prediabetic range)     HbA1c, POC (controlled diabetic range)     Patient claims compliance with diabetes medication. No obvious side effects. Reports no substantial low sugar spells. Most numbers are generally in good range when checked fasting. Generally does not miss a dose of medication. Watching diabetic diet closely  Blood pressure medicine and blood pressure levels reviewed today with patient. Compliant with blood pressure medicine. States does not miss a dose. No obvious side effects. Blood pressure generally good when checked elsewhere. Watching salt intake.   Patient continues to take lipid medication regularly. No obvious side effects from it. Generally does not miss a dose. Prior blood work results are reviewed with patient. Patient continues to work on fat intake in diet  BP monitor at home show s numbers overall are baetter   Rare use  Of inhaler, took cold meds lat weej Eye doc visit  Exercise   Dancing reg mon and thur  Cardio  [facility]   Hour long session and it helps    Review of Systems No headache, no major weight loss or weight gain, no chest pain  no back pain abdominal pain no change in bowel habits complete ROS otherwise negative     Objective:   Physical Exam  Alert and oriented, vitals reviewed and stable, NAD ENT-TM's and ext canals WNL bilat via otoscopic exam Soft palate, tonsils and post pharynx WNL via oropharyngeal exam Neck-symmetric, no masses; thyroid nonpalpable and nontender Pulmonary-no tachypnea or accessory muscle use; Clear without wheezes via auscultation Card--no abnrml murmurs, rhythm reg and rate WNL Carotid pulses symmetric, without bruits Fingertips sensation intact capillary refill good pulses excellent     Assessment & Plan:  Impression 1 type 2 diabetes.  Excellent control discussed maintain same meds  2.  Hyperlipidemia status uncertain time blood work discussed will order  3.  Hypertension very good control discussed maintain same meds  4.  Nonspecific fingertip paresthesias.  Exam within normal limits today recommend no major work-up at this time discussed  Appropriate blood work diet exercise discussed vaccine status clarified follow-up in 6 months

## 2018-04-09 DIAGNOSIS — E785 Hyperlipidemia, unspecified: Secondary | ICD-10-CM | POA: Diagnosis not present

## 2018-04-09 DIAGNOSIS — G4733 Obstructive sleep apnea (adult) (pediatric): Secondary | ICD-10-CM | POA: Diagnosis not present

## 2018-04-09 DIAGNOSIS — I10 Essential (primary) hypertension: Secondary | ICD-10-CM | POA: Diagnosis not present

## 2018-04-09 DIAGNOSIS — R5383 Other fatigue: Secondary | ICD-10-CM | POA: Diagnosis not present

## 2018-04-10 ENCOUNTER — Other Ambulatory Visit: Payer: Self-pay | Admitting: Family Medicine

## 2018-04-10 DIAGNOSIS — E785 Hyperlipidemia, unspecified: Secondary | ICD-10-CM | POA: Diagnosis not present

## 2018-04-10 DIAGNOSIS — Z79899 Other long term (current) drug therapy: Secondary | ICD-10-CM | POA: Diagnosis not present

## 2018-04-10 DIAGNOSIS — I10 Essential (primary) hypertension: Secondary | ICD-10-CM | POA: Diagnosis not present

## 2018-04-10 DIAGNOSIS — E1100 Type 2 diabetes mellitus with hyperosmolarity without nonketotic hyperglycemic-hyperosmolar coma (NKHHC): Secondary | ICD-10-CM | POA: Diagnosis not present

## 2018-04-10 DIAGNOSIS — Z794 Long term (current) use of insulin: Secondary | ICD-10-CM | POA: Diagnosis not present

## 2018-04-11 LAB — CBC WITH DIFFERENTIAL/PLATELET
Basophils Absolute: 0.1 10*3/uL (ref 0.0–0.2)
Basos: 1 %
EOS (ABSOLUTE): 0.4 10*3/uL (ref 0.0–0.4)
Eos: 6 %
Hematocrit: 38.2 % (ref 34.0–46.6)
Hemoglobin: 12.8 g/dL (ref 11.1–15.9)
Immature Grans (Abs): 0 10*3/uL (ref 0.0–0.1)
Immature Granulocytes: 0 %
Lymphocytes Absolute: 1.9 10*3/uL (ref 0.7–3.1)
Lymphs: 27 %
MCH: 30 pg (ref 26.6–33.0)
MCHC: 33.5 g/dL (ref 31.5–35.7)
MCV: 90 fL (ref 79–97)
Monocytes Absolute: 0.9 10*3/uL (ref 0.1–0.9)
Monocytes: 12 %
NEUTROS PCT: 54 %
Neutrophils Absolute: 3.8 10*3/uL (ref 1.4–7.0)
Platelets: 270 10*3/uL (ref 150–450)
RBC: 4.27 x10E6/uL (ref 3.77–5.28)
RDW: 13.5 % (ref 12.3–15.4)
WBC: 7.1 10*3/uL (ref 3.4–10.8)

## 2018-04-11 LAB — BASIC METABOLIC PANEL
BUN/Creatinine Ratio: 19 (ref 12–28)
BUN: 17 mg/dL (ref 8–27)
CALCIUM: 9.7 mg/dL (ref 8.7–10.3)
CHLORIDE: 104 mmol/L (ref 96–106)
CO2: 23 mmol/L (ref 20–29)
Creatinine, Ser: 0.91 mg/dL (ref 0.57–1.00)
GFR calc Af Amer: 75 mL/min/{1.73_m2} (ref >59)
GFR calc non Af Amer: 65 mL/min/{1.73_m2} (ref >59)
Glucose: 90 mg/dL (ref 65–99)
Potassium: 4.2 mmol/L (ref 3.5–5.2)
Sodium: 143 mmol/L (ref 134–144)

## 2018-04-11 LAB — MICROALBUMIN / CREATININE URINE RATIO
Creatinine, Urine: 206 mg/dL
Microalb/Creat Ratio: 32.8 mg/g creat — ABNORMAL HIGH (ref 0.0–30.0)
Microalbumin, Urine: 67.6 ug/mL

## 2018-04-11 LAB — LIPID PANEL
CHOLESTEROL TOTAL: 167 mg/dL (ref 100–199)
Chol/HDL Ratio: 2.2 ratio (ref 0.0–4.4)
HDL: 75 mg/dL (ref >39)
LDL Calculated: 68 mg/dL (ref 0–99)
Triglycerides: 119 mg/dL (ref 0–149)
VLDL Cholesterol Cal: 24 mg/dL (ref 5–40)

## 2018-04-11 LAB — HEPATIC FUNCTION PANEL
ALT: 15 IU/L (ref 0–32)
AST: 18 IU/L (ref 0–40)
Albumin: 4.5 g/dL (ref 3.6–4.8)
Alkaline Phosphatase: 71 IU/L (ref 39–117)
Bilirubin Total: 0.3 mg/dL (ref 0.0–1.2)
Bilirubin, Direct: 0.12 mg/dL (ref 0.00–0.40)
Total Protein: 7.3 g/dL (ref 6.0–8.5)

## 2018-04-15 ENCOUNTER — Encounter: Payer: Self-pay | Admitting: Family Medicine

## 2018-04-27 ENCOUNTER — Other Ambulatory Visit: Payer: Self-pay | Admitting: Family Medicine

## 2018-06-11 DIAGNOSIS — D649 Anemia, unspecified: Secondary | ICD-10-CM | POA: Diagnosis not present

## 2018-06-11 DIAGNOSIS — Z79899 Other long term (current) drug therapy: Secondary | ICD-10-CM | POA: Diagnosis not present

## 2018-06-11 DIAGNOSIS — R809 Proteinuria, unspecified: Secondary | ICD-10-CM | POA: Diagnosis not present

## 2018-06-11 DIAGNOSIS — I10 Essential (primary) hypertension: Secondary | ICD-10-CM | POA: Diagnosis not present

## 2018-06-11 DIAGNOSIS — N182 Chronic kidney disease, stage 2 (mild): Secondary | ICD-10-CM | POA: Diagnosis not present

## 2018-06-11 DIAGNOSIS — E559 Vitamin D deficiency, unspecified: Secondary | ICD-10-CM | POA: Diagnosis not present

## 2018-06-15 DIAGNOSIS — N182 Chronic kidney disease, stage 2 (mild): Secondary | ICD-10-CM | POA: Diagnosis not present

## 2018-06-15 DIAGNOSIS — G4730 Sleep apnea, unspecified: Secondary | ICD-10-CM | POA: Diagnosis not present

## 2018-06-15 DIAGNOSIS — R809 Proteinuria, unspecified: Secondary | ICD-10-CM | POA: Diagnosis not present

## 2018-06-25 DIAGNOSIS — H35372 Puckering of macula, left eye: Secondary | ICD-10-CM | POA: Diagnosis not present

## 2018-06-25 DIAGNOSIS — H52223 Regular astigmatism, bilateral: Secondary | ICD-10-CM | POA: Diagnosis not present

## 2018-06-25 DIAGNOSIS — H5202 Hypermetropia, left eye: Secondary | ICD-10-CM | POA: Diagnosis not present

## 2018-06-25 DIAGNOSIS — H524 Presbyopia: Secondary | ICD-10-CM | POA: Diagnosis not present

## 2018-06-27 ENCOUNTER — Other Ambulatory Visit: Payer: Self-pay | Admitting: Family Medicine

## 2018-06-29 ENCOUNTER — Other Ambulatory Visit: Payer: Self-pay | Admitting: *Deleted

## 2018-06-29 MED ORDER — AMLODIPINE BESYLATE 5 MG PO TABS: 5.0000 mg | ORAL_TABLET | Freq: Every day | ORAL | 0 refills | Status: DC

## 2018-09-18 ENCOUNTER — Telehealth: Payer: Self-pay | Admitting: Family Medicine

## 2018-09-18 NOTE — Telephone Encounter (Signed)
Any symtoms? Close exposure to known case? Or just interested? If jus wondering , the syste,m is not authorizing testing for symtomatic individuals, even higher rish

## 2018-09-18 NOTE — Telephone Encounter (Signed)
So sorry not doing testing of folks with no symtoms even if yo have lots of risk factors, for instance our practie would have to test 500 people if thhat was our criteria! And even a normal test does not provide any protection going forward so would have to repeat over and over, we cn disc more at the fu visit next wk

## 2018-09-18 NOTE — Telephone Encounter (Signed)
Pt would like an order for COVID testing she went across the street from [facility] and was told she needed an order. She would like to be tested due to her underlying health conditions and age.   CB # [id number]

## 2018-09-18 NOTE — Telephone Encounter (Signed)
Patient advised per Dr Richardson Landry: He is so sorry they are not doing testing of folks with no symtoms even if they have lots of risk factors, for instance our practie would have to test 500 people if that was our criteria! And even a normal test does not provide any protection going forward so would have to repeat over and over, we can disc more at the f/u visit next wk. Patient verbalized understanding.

## 2018-09-18 NOTE — Telephone Encounter (Signed)
Patient states she is not having any symptoms and has no close exposures that she is aware of

## 2018-09-26 ENCOUNTER — Other Ambulatory Visit (HOSPITAL_COMMUNITY): Payer: Self-pay | Admitting: Family Medicine

## 2018-09-26 DIAGNOSIS — Z1231 Encounter for screening mammogram for malignant neoplasm of breast: Secondary | ICD-10-CM

## 2018-09-27 ENCOUNTER — Ambulatory Visit: Payer: Medicare Other | Admitting: Family Medicine

## 2018-09-27 ENCOUNTER — Other Ambulatory Visit: Payer: Self-pay

## 2018-09-27 ENCOUNTER — Ambulatory Visit (INDEPENDENT_AMBULATORY_CARE_PROVIDER_SITE_OTHER): Payer: Medicare Other | Admitting: Family Medicine

## 2018-09-27 DIAGNOSIS — J4521 Mild intermittent asthma with (acute) exacerbation: Secondary | ICD-10-CM | POA: Diagnosis not present

## 2018-09-27 DIAGNOSIS — Z794 Long term (current) use of insulin: Secondary | ICD-10-CM

## 2018-09-27 DIAGNOSIS — E1100 Type 2 diabetes mellitus with hyperosmolarity without nonketotic hyperglycemic-hyperosmolar coma (NKHHC): Secondary | ICD-10-CM

## 2018-09-27 DIAGNOSIS — E785 Hyperlipidemia, unspecified: Secondary | ICD-10-CM

## 2018-09-27 DIAGNOSIS — I10 Essential (primary) hypertension: Secondary | ICD-10-CM | POA: Diagnosis not present

## 2018-09-27 NOTE — Progress Notes (Signed)
   Subjective:    Patient ID: Bianca Arroyo, female    DOB: [birth year], 68 y.o.   MRN: [id number] Audio plus vid Diabetes She presents for her follow-up diabetic visit. She has type 2 diabetes mellitus. Current diabetic treatments: glipizide 5mg  one bid. She is compliant with treatment all of the time. She is following a generally healthy diet. Home blood sugar record trend: has been a little high. Eye exam is current (march 2020).   Pt states no concerns today.   Virtual Visit via Telephone Note  I connected with Bianca Arroyo on 09/27/18 at  2:00 PM EDT by telephone and verified that I am speaking with the correct person using two identifiers.  Location: Patient: home Provider: office   I discussed the limitations, risks, security and privacy concerns of performing an evaluation and management service by telephone and the availability of in person appointments. I also discussed with the patient that there may be a patient responsible charge related to this service. The patient expressed understanding and agreed to proceed.   History of Present Illness:    Observations/Objective:   Assessment and Plan:   Follow Up Instructions:    I discussed the assessment and treatment plan with the patient. The patient was provided an opportunity to ask questions and all were answered. The patient agreed with the plan and demonstrated an understanding of the instructions.   The patient was advised to call back or seek an in-person evaluation if the symptoms worsen or if the condition fails to improve as anticipated.  I provided 25 minutes of non-face-to-face time during this encounter.  Blood pressure medicine and blood pressure levels reviewed today with patient. Compliant with blood pressure medicine. States does not miss a dose. No obvious side effects. Blood pressure generally good when checked elsewhere. Watching salt intake.   Patient continues to take lipid medication  regularly. No obvious side effects from it. Generally does not miss a dose. Prior blood work results are reviewed with patient. Patient continues to work on fat intake in diet  Patient claims compliance with diabetes medication. No obvious side effects. Reports no substantial low sugar spells. Most numbers are generally in good range when checked fasting. Generally does not miss a dose of medication. Watching diabetic diet closely  Glu overall good  141 over 84   128 over 79       Review of Systems    No headache, no major weight loss or weight gain, no chest pain no back pain abdominal pain no change in bowel habits complete ROS otherwise negative  Objective:   Physical Exam  Virtual      Assessment & Plan:  Impression 1 type 2 diabetes apparent good control discussed to maintain same meds  2.  Hypertension blood pressures generally good control.  Compliance discussed salt intake discussed diet discussed exercise discussed to maintain same meds  3.  Hyperlipidemia prior blood work reviewed rationale for compliance discussed meds discussed  Medications refilled no blood work at this time follow-up in 6 months

## 2018-09-28 ENCOUNTER — Ambulatory Visit (HOSPITAL_COMMUNITY)
Admission: RE | Admit: 2018-09-28 | Discharge: 2018-09-28 | Disposition: A | Discharge status: Home / Self Care | Payer: Medicare Other | Source: Ambulatory Visit | Attending: Family Medicine | Admitting: Family Medicine

## 2018-09-28 DIAGNOSIS — Z1231 Encounter for screening mammogram for malignant neoplasm of breast: Secondary | ICD-10-CM | POA: Diagnosis not present

## 2018-10-04 ENCOUNTER — Other Ambulatory Visit: Payer: Self-pay | Admitting: Family Medicine

## 2018-10-04 ENCOUNTER — Other Ambulatory Visit: Payer: Self-pay | Admitting: Student

## 2018-11-14 DIAGNOSIS — Z03818 Encounter for observation for suspected exposure to other biological agents ruled out: Secondary | ICD-10-CM | POA: Diagnosis not present

## 2018-11-16 ENCOUNTER — Encounter: Payer: Self-pay | Admitting: Gastroenterology

## 2018-12-01 ENCOUNTER — Other Ambulatory Visit: Payer: Self-pay | Admitting: Cardiology

## 2018-12-01 ENCOUNTER — Other Ambulatory Visit: Payer: Self-pay | Admitting: Student

## 2018-12-03 ENCOUNTER — Other Ambulatory Visit: Payer: Self-pay | Admitting: Podiatry

## 2018-12-03 ENCOUNTER — Other Ambulatory Visit: Payer: Self-pay

## 2018-12-03 ENCOUNTER — Ambulatory Visit (INDEPENDENT_AMBULATORY_CARE_PROVIDER_SITE_OTHER): Payer: Medicare Other

## 2018-12-03 ENCOUNTER — Ambulatory Visit (INDEPENDENT_AMBULATORY_CARE_PROVIDER_SITE_OTHER): Payer: Medicare Other | Admitting: Podiatry

## 2018-12-03 VITALS — Temp 98.0°F

## 2018-12-03 DIAGNOSIS — M7661 Achilles tendinitis, right leg: Secondary | ICD-10-CM

## 2018-12-03 DIAGNOSIS — M79671 Pain in right foot: Secondary | ICD-10-CM | POA: Diagnosis not present

## 2018-12-03 MED ORDER — DICLOFENAC SODIUM 75 MG PO TBEC: 75.0000 mg | DELAYED_RELEASE_TABLET | Freq: Two times a day (BID) | ORAL | 2 refills | Status: DC

## 2018-12-03 MED ORDER — NITROGLYCERIN 0.1 MG/HR TD PT24: 0.3000 mg | MEDICATED_PATCH | Freq: Every day | TRANSDERMAL | Status: DC

## 2018-12-03 NOTE — Patient Instructions (Signed)

## 2018-12-04 NOTE — Telephone Encounter (Signed)
Refill Request.  

## 2018-12-05 NOTE — Progress Notes (Signed)
Subjective:   Patient ID: Bianca Arroyo, female   DOB: 68 y.o.   MRN: [id number]   HPI Patient presents stating she is been developing a lot of pain in her right heel and states it is been about 7 months and she likes to be active but has not been able to.  Patient does not smoke and is not currently as active as she wants   Review of Systems  All other systems reviewed and are negative.       Objective:  Physical Exam Vitals signs and nursing note reviewed.  Constitutional:      Appearance: She is well-developed.  Pulmonary:     Effort: Pulmonary effort is normal.  Musculoskeletal: Normal range of motion.  Skin:    General: Skin is warm.  Neurological:     Mental Status: She is alert.     Neurovascular status intact muscle strength adequate range of motion adequate except for some splinting due to pain right with exquisite discomfort at the muscle tendon junction right posterior Achilles with no pain at the insertion calcaneus     Assessment:  Exquisite Achilles tendinitis right with mild nodular formation pain with palpation at the muscle tendon junction     Plan:  H&P reviewed condition and discussed the difficulty of treating the muscle tendon junction.  At this point organ to start nitro patches stretching gentle totally and I dispensed air fracture walker to try to immobilize and take pressure off the tendon.  Reappoint to recheck  X-rays indicate that there is minimal spur no indication stress fracture with some obvious swelling at the muscle tendon junction

## 2018-12-18 ENCOUNTER — Ambulatory Visit: Payer: Medicare Other | Admitting: Orthopaedic Surgery

## 2018-12-26 ENCOUNTER — Encounter: Payer: Self-pay | Admitting: Podiatry

## 2018-12-26 ENCOUNTER — Ambulatory Visit (INDEPENDENT_AMBULATORY_CARE_PROVIDER_SITE_OTHER): Payer: Medicare Other | Admitting: Podiatry

## 2018-12-26 ENCOUNTER — Other Ambulatory Visit: Payer: Self-pay

## 2018-12-26 DIAGNOSIS — M79671 Pain in right foot: Secondary | ICD-10-CM | POA: Diagnosis not present

## 2018-12-26 DIAGNOSIS — M7661 Achilles tendinitis, right leg: Secondary | ICD-10-CM

## 2018-12-29 NOTE — Progress Notes (Signed)
Subjective:   Patient ID: Bianca Arroyo, female   DOB: 68 y.o.   MRN: [id number]   HPI Patient states her foot is feeling much better with the pain almost completely gone   ROS      Objective:  Physical Exam  Neurovascular status intact with patient's right foot doing much better with pain which is improved by about 80% posterior heel     Assessment:  Acute Achilles tendinitis right which is really improving with mild to moderate discomfort upon deep palpation     Plan:  H&P condition reviewed recommended physical therapy anti-inflammatory support and utilize heel lift.  Educated her at great length on condition

## 2019-01-04 ENCOUNTER — Other Ambulatory Visit: Payer: Self-pay | Admitting: Family Medicine

## 2019-01-07 DIAGNOSIS — M6281 Muscle weakness (generalized): Secondary | ICD-10-CM | POA: Diagnosis not present

## 2019-01-07 DIAGNOSIS — E119 Type 2 diabetes mellitus without complications: Secondary | ICD-10-CM | POA: Diagnosis not present

## 2019-01-07 DIAGNOSIS — M79671 Pain in right foot: Secondary | ICD-10-CM | POA: Diagnosis not present

## 2019-01-07 DIAGNOSIS — M25571 Pain in right ankle and joints of right foot: Secondary | ICD-10-CM | POA: Diagnosis not present

## 2019-01-07 DIAGNOSIS — R262 Difficulty in walking, not elsewhere classified: Secondary | ICD-10-CM | POA: Diagnosis not present

## 2019-01-14 ENCOUNTER — Ambulatory Visit (INDEPENDENT_AMBULATORY_CARE_PROVIDER_SITE_OTHER): Payer: Self-pay | Admitting: *Deleted

## 2019-01-14 ENCOUNTER — Other Ambulatory Visit: Payer: Self-pay

## 2019-01-14 DIAGNOSIS — Z86010 Personal history of colonic polyps: Secondary | ICD-10-CM

## 2019-01-14 NOTE — Progress Notes (Signed)
Offered pt the end of Dec appointment.  Pt declined and wanted to wait until Jan so it would be after the holidays.  Pt informed that I would call her back once Jan schedules are available.  Pt voiced understanding.

## 2019-01-14 NOTE — Progress Notes (Signed)
Gastroenterology Pre-Procedure Review  Request Date: 01/14/2019 Requesting Physician: 5 year recall, Last TCS 12/06/2013 done by Dr. Oneida Alar, tubular adenoma, hyperplastic polyp  PATIENT REVIEW QUESTIONS: The patient responded to the following health history questions as indicated:    1. Diabetes Melitis: yes 2. Joint replacements in the past 12 months: no 3. Major health problems in the past 3 months: no 4. Has an artificial valve or MVP: no 5. Has a defibrillator: no 6. Has been advised in past to take antibiotics in advance of a procedure like teeth cleaning: no 7. Family history of colon cancer: no  8. Alcohol Use: yes, 2 or 3 drinks weekly 9. Illicit drug Use: no 10. History of sleep apnea: yes, doesn't use CPAP  11. History of coronary artery or other vascular stents placed within the last 12 months: no 12. History of any prior anesthesia complications: no 13. There is no height or weight on file to calculate BMI. ht: 5'2 wt: 144 lbs    MEDICATIONS & ALLERGIES:    Patient reports the following regarding taking any blood thinners:   Plavix? no Aspirin? yes Coumadin? no Brilinta? no Xarelto? no Eliquis? no Pradaxa? no Savaysa? no Effient? no  Patient confirms/reports the following medications:  Current Outpatient Medications  Medication Sig Dispense Refill  . albuterol (PROVENTIL HFA;VENTOLIN HFA) 108 (90 Base) MCG/ACT inhaler Inhale 2 puffs into the lungs every 6 (six) hours as needed for wheezing or shortness of breath. 1 Inhaler 0  . amLODipine (NORVASC) 5 MG tablet Take 1 tablet by mouth once daily 90 tablet 0  . aspirin 81 MG chewable tablet Chew 81 mg by mouth every morning.     . carvedilol (COREG) 25 MG tablet Take 1 tablet by mouth twice daily 180 tablet 1  . diclofenac (VOLTAREN) 75 MG EC tablet Take 1 tablet (75 mg total) by mouth 2 (two) times daily. 50 tablet 2  . fluticasone (FLONASE) 50 MCG/ACT nasal spray USE TWO SPRAY(S) IN EACH NOSTRIL ONCE DAILY 16 g 1  .  fluticasone (FLOVENT HFA) 44 MCG/ACT inhaler Two puffs twice daily (Patient taking differently: as needed. Two puffs twice daily) 1 Inhaler 5  . glipiZIDE (GLUCOTROL) 5 MG tablet TAKE 1 TABLET BY MOUTH TWICE DAILY BEFORE MEAL(S) 180 tablet 0  . ipratropium (ATROVENT) 0.06 % nasal spray Place 1-2 sprays into the nose daily as needed.    Marland Kitchen losartan (COZAAR) 100 MG tablet Take 1 tablet by mouth once daily 90 tablet 0  . lovastatin (MEVACOR) 20 MG tablet Take 1 tablet by mouth once daily 90 tablet 0  . Multiple Vitamins-Minerals (ALIVE ONCE DAILY WOMENS 50+ PO) Take 1 tablet by mouth every morning.     Marland Kitchen PROVENTIL HFA 108 (90 Base) MCG/ACT inhaler INHALE TWO PUFFS BY MOUTH EVERY 6 HOURS AS NEEDED FOR WHEEZING OR SHORTNESS OF BREATH (Patient taking differently: as needed. ) 7 each 0  . spironolactone (ALDACTONE) 50 MG tablet Take 1 tablet by mouth once daily 30 tablet 6  . Tetrahydrozoline HCl (VISINE OP) Apply 1 drop to eye daily as needed (dry eye relief).     . valACYclovir (VALTREX) 500 MG tablet TAKE 1 TABLET BY MOUTH TWICE DAILY AS NEEDED FOR 3 DAYS FOR FLARES (Patient taking differently: as needed. TAKE 1 TABLET BY MOUTH TWICE DAILY AS NEEDED FOR 3 DAYS FOR FLARES) 18 tablet 6  . vitamin B-12 (CYANOCOBALAMIN) 1000 MCG tablet Take 1,000 mcg by mouth every morning.    Marland Kitchen ibuprofen (ADVIL,MOTRIN) 600 MG  tablet Take 1 tablet (600 mg total) by mouth every 8 (eight) hours as needed. (Patient taking differently: Take 600 mg by mouth as needed. ) 30 tablet 0   Current Facility-Administered Medications  Medication Dose Route Frequency Provider Last Rate Last Dose  . nitroGLYCERIN (NITRODUR - Dosed in mg/24 hr) patch 0.3 mg  0.3 mg Transdermal Daily Wallene Huh, DPM        Patient confirms/reports the following allergies:  Allergies  Allergen Reactions  . Colcrys [Colchicine]     GI trouble    No orders of the defined types were placed in this encounter.   AUTHORIZATION INFORMATION Primary  Insurance: Medicare,  ID #: [id number] Pre-Cert / Auth required: No, not required  Secondary Insurance: [place],  [place] #: [id number],  Group #: [id number] Pre-Cert / Josem Kaufmann required:  Pre-Cert / Auth #:  SCHEDULE INFORMATION: Procedure has been scheduled as follows:  Date: , Time:  Location:  This Gastroenterology Pre-Precedure Review Form is being routed to the following provider(s): Roseanne Kaufman, NP

## 2019-01-17 DIAGNOSIS — Z23 Encounter for immunization: Secondary | ICD-10-CM | POA: Diagnosis not present

## 2019-01-18 NOTE — Progress Notes (Signed)
Needs office visit.

## 2019-01-21 NOTE — Progress Notes (Signed)
PATIENT SCHEDULED  °

## 2019-01-22 ENCOUNTER — Encounter: Payer: Self-pay | Admitting: *Deleted

## 2019-01-22 NOTE — Progress Notes (Signed)
Mailed letter to pt with appointment information and procedure cancellation.   

## 2019-02-07 ENCOUNTER — Other Ambulatory Visit: Payer: Self-pay | Admitting: Family Medicine

## 2019-02-26 ENCOUNTER — Encounter: Payer: Self-pay | Admitting: Gastroenterology

## 2019-02-26 ENCOUNTER — Other Ambulatory Visit: Payer: Self-pay

## 2019-02-26 ENCOUNTER — Ambulatory Visit (INDEPENDENT_AMBULATORY_CARE_PROVIDER_SITE_OTHER): Payer: Medicare Other | Admitting: Gastroenterology

## 2019-02-26 VITALS — BP 110/71 | HR 72 | Temp 97.6°F | Ht 62.0 in | Wt 145.2 lb

## 2019-02-26 DIAGNOSIS — K5900 Constipation, unspecified: Secondary | ICD-10-CM | POA: Diagnosis not present

## 2019-02-26 DIAGNOSIS — Z860100 Personal history of colon polyps, unspecified: Secondary | ICD-10-CM | POA: Insufficient documentation

## 2019-02-26 MED ORDER — PEG 3350-KCL-NA BICARB-NACL 420 G PO SOLR: 4000.0000 mL | ORAL | 0 refills | Status: DC

## 2019-02-26 NOTE — Assessment & Plan Note (Signed)
Continue dietary fiber/fiber supplement/Activia as before.  If needed add MiraLAX 1 capful once to twice daily.

## 2019-02-26 NOTE — Assessment & Plan Note (Signed)
68 year old female with history of adenomatous colon polyps.  She is due for 5-year surveillance colonoscopy with overtube.  Clinically she is doing well.  She manages her constipation with fiber supplements/dietary fiber/Activia.  Since her last colonoscopy, she consumed alcohol on a more regular basis.  Still nothing excessive but has 1-2 drinks 3-4 times per week.  Plan for deep sedation.  I have discussed the risks, alternatives, benefits with regards to but not limited to the risk of reaction to medication, bleeding, infection, perforation and the patient is agreeable to proceed. Written consent to be obtained.

## 2019-02-26 NOTE — Patient Instructions (Signed)
1. Continue current regimen for constipation.  If you have any difficulties, you can add MiraLAX 1 capful once to twice daily to maintain soft regular stools. 2. Colonoscopy in the near future.  Please see separate instructions.

## 2019-02-26 NOTE — Progress Notes (Addendum)
Primary Care Physician:  Mikey Kirschner, MD  Primary Gastroenterologist:  Barney Drain, MD  REVIEWED-NO ADDITIONAL RECOMMENDATIONS.  Chief Complaint  Patient presents with  . Consult    TCS last done 5 yrs ago    HPI:  Bianca Arroyo is a 68 y.o. female here to discuss surveillance colonoscopy.  Her last colonoscopy was in August 2015.  She had 2 colon polyps removed, moderate diverticulosis, left redundant colon, small internal hemorrhoids.  One of her polyps was an adenoma.  She is recommended to have a 5-year surveillance colonoscopy with overtube.  Clinically doing well.  She has chronic constipation which she manages with dietary fiber, fiber supplements. Eats Activia. No blood in stool or melena. No abdominal pain. Appetite good. Only occasional heartburn. No weight loss.  Since we last saw her she drinks alcohol more regularly.  1-2 drinks at least 3-4 times per week.  Consumes wine or liquor.  Current Outpatient Medications  Medication Sig Dispense Refill  . albuterol (PROVENTIL HFA;VENTOLIN HFA) 108 (90 Base) MCG/ACT inhaler Inhale 2 puffs into the lungs every 6 (six) hours as needed for wheezing or shortness of breath. 1 Inhaler 0  . amLODipine (NORVASC) 5 MG tablet Take 1 tablet by mouth once daily 90 tablet 0  . aspirin 81 MG chewable tablet Chew 81 mg by mouth every morning.     . carvedilol (COREG) 25 MG tablet Take 1 tablet by mouth twice daily 180 tablet 1  . diclofenac (VOLTAREN) 75 MG EC tablet Take 1 tablet (75 mg total) by mouth 2 (two) times daily. 50 tablet 2  . fluticasone (FLONASE) 50 MCG/ACT nasal spray USE TWO SPRAY(S) IN EACH NOSTRIL ONCE DAILY 16 g 1  . fluticasone (FLOVENT HFA) 44 MCG/ACT inhaler Two puffs twice daily (Patient taking differently: as needed. Two puffs twice daily) 1 Inhaler 5  . glipiZIDE (GLUCOTROL) 5 MG tablet TAKE 1 TABLET BY MOUTH TWICE DAILY BEFORE MEAL(S) 180 tablet 0  . ibuprofen (ADVIL,MOTRIN) 600 MG tablet Take 1 tablet (600 mg  total) by mouth every 8 (eight) hours as needed. (Patient taking differently: Take 600 mg by mouth as needed. ) 30 tablet 0  . ipratropium (ATROVENT) 0.06 % nasal spray Place 1-2 sprays into the nose daily as needed.    Marland Kitchen losartan (COZAAR) 100 MG tablet Take 1 tablet by mouth once daily 90 tablet 0  . lovastatin (MEVACOR) 20 MG tablet Take 1 tablet by mouth once daily 90 tablet 0  . Multiple Vitamins-Minerals (ALIVE ONCE DAILY WOMENS 50+ PO) Take 1 tablet by mouth every morning.     Marland Kitchen spironolactone (ALDACTONE) 50 MG tablet Take 1 tablet by mouth once daily 30 tablet 6  . Tetrahydrozoline HCl (VISINE OP) Apply 1 drop to eye daily as needed (dry eye relief).     . valACYclovir (VALTREX) 500 MG tablet TAKE 1 TABLET BY MOUTH TWICE DAILY AS NEEDED FOR 3 DAYS FOR FLARES (Patient taking differently: as needed. TAKE 1 TABLET BY MOUTH TWICE DAILY AS NEEDED FOR 3 DAYS FOR FLARES) 18 tablet 6  . vitamin B-12 (CYANOCOBALAMIN) 1000 MCG tablet Take 1,000 mcg by mouth every morning.     Current Facility-Administered Medications  Medication Dose Route Frequency Provider Last Rate Last Dose  . nitroGLYCERIN (NITRODUR - Dosed in mg/24 hr) patch 0.3 mg  0.3 mg Transdermal Daily Wallene Huh, DPM        Allergies as of 02/26/2019 - Review Complete 02/26/2019  Allergen Reaction Noted  .  Colcrys [colchicine]  02/25/2013    Past Medical History:  Diagnosis Date  . H/O echocardiogram    a. 05/2017: echo showing EF of 65-70%, no regional WMA, moderate LVH, and mild MR.   Marland Kitchen Hyperlipidemia   . Hypertension   . Type 2 diabetes mellitus ([place])     Past Surgical History:  Procedure Laterality Date  . BREAST BIOPSY Left    benign  . CESAREAN SECTION    . COLONOSCOPY    . COLONOSCOPY N/A 12/06/2013   Dr. Oneida Alar: Left colon redundant, diverticulosis, 2 polyps removed, one tubular adenoma the other hyperplastic.  Consider colonoscopy in 5 to 10 years with overtube  . Left wrist surgery    . Right trigger thumb   2013    Family History  Problem Relation Age of Onset  . Diabetes Father   . Hypertension Father   . Diabetes Mother   . High blood pressure Mother   . High blood pressure Sister   . Diabetes Sister   . Colon cancer Neg Hx     Social History   Socioeconomic History  . Marital status: Widowed    Spouse name: Not on file  . Number of children: Not on file  . Years of education: Not on file  . Highest education level: Not on file  Occupational History  . Not on file  Social Needs  . Financial resource strain: Not on file  . Food insecurity    Worry: Not on file    Inability: Not on file  . Transportation needs    Medical: Not on file    Non-medical: Not on file  Tobacco Use  . Smoking status: Never Smoker  . Smokeless tobacco: Never Used  Substance and Sexual Activity  . Alcohol use: Yes    Alcohol/week: 3.0 standard drinks    Types: 3 Standard drinks or equivalent per week    Comment: 3-4 times per week  . Drug use: No  . Sexual activity: Not on file  Lifestyle  . Physical activity    Days per week: Not on file    Minutes per session: Not on file  . Stress: Not on file  Relationships  . Social Herbalist on phone: Not on file    Gets together: Not on file    Attends religious service: Not on file    Active member of club or organization: Not on file    Attends meetings of clubs or organizations: Not on file    Relationship status: Not on file  . Intimate partner violence    Fear of current or ex partner: Not on file    Emotionally abused: Not on file    Physically abused: Not on file    Forced sexual activity: Not on file  Other Topics Concern  . Not on file  Social History Narrative  . Not on file      ROS:  General: Negative for anorexia, weight loss, fever, chills, fatigue, weakness. Eyes: Negative for vision changes.  ENT: Negative for hoarseness, difficulty swallowing , nasal congestion. CV: Negative for chest pain, angina,  palpitations, dyspnea on exertion, peripheral edema.  Respiratory: Negative for dyspnea at rest, dyspnea on exertion, cough, sputum, wheezing.  GI: See history of present illness. GU:  Negative for dysuria, hematuria, urinary incontinence, urinary frequency, nocturnal urination.  MS: Negative for joint pain, low back pain.  Derm: Negative for rash or itching.  Neuro: Negative for weakness, abnormal sensation, seizure,  frequent headaches, memory loss, confusion.  Psych: Negative for anxiety, depression, suicidal ideation, hallucinations.  Endo: Negative for unusual weight change.  Heme: Negative for bruising or bleeding. Allergy: Negative for rash or hives.    Physical Examination:  BP 110/71   Pulse 72   Temp 97.6 F (36.4 C) (Oral)   Ht 5\' 2"  (1.575 m)   Wt 145 lb 3.2 oz (65.9 kg)   BMI 26.56 kg/m    General: Well-nourished, well-developed in no acute distress.  Head: Normocephalic, atraumatic.   Eyes: Conjunctiva pink, no icterus. Mouth: Oropharyngeal mucosa moist and pink , no lesions erythema or exudate. Neck: Supple without thyromegaly, masses, or lymphadenopathy.  Lungs: Clear to auscultation bilaterally.  Heart: Regular rate and rhythm, no murmurs rubs or gallops.  Abdomen: Bowel sounds are normal, nontender, nondistended, no hepatosplenomegaly or masses, no abdominal bruits or    hernia , no rebound or guarding.   Rectal: Deferred  extremities: No lower extremity edema. No clubbing or deformities.  Neuro: Alert and oriented x 4 , grossly normal neurologically.  Skin: Warm and dry, no rash or jaundice.   Psych: Alert and cooperative, normal mood and affect.  Labs: Lab Results  Component Value Date   CREATININE 0.91 04/10/2018   BUN 17 04/10/2018   NA 143 04/10/2018   K 4.2 04/10/2018   CL 104 04/10/2018   CO2 23 04/10/2018   Lab Results  Component Value Date   ALT 15 04/10/2018   AST 18 04/10/2018   ALKPHOS 71 04/10/2018   BILITOT 0.3 04/10/2018   Lab  Results  Component Value Date   WBC 7.1 04/10/2018   HGB 12.8 04/10/2018   HCT 38.2 04/10/2018   MCV 90 04/10/2018   PLT 270 04/10/2018     Imaging Studies: No results found.

## 2019-02-26 NOTE — Progress Notes (Signed)
cc'ed to pcp °

## 2019-03-06 ENCOUNTER — Telehealth: Payer: Self-pay | Admitting: Family Medicine

## 2019-03-06 DIAGNOSIS — E1100 Type 2 diabetes mellitus with hyperosmolarity without nonketotic hyperglycemic-hyperosmolar coma (NKHHC): Secondary | ICD-10-CM

## 2019-03-06 DIAGNOSIS — E785 Hyperlipidemia, unspecified: Secondary | ICD-10-CM

## 2019-03-06 DIAGNOSIS — I10 Essential (primary) hypertension: Secondary | ICD-10-CM

## 2019-03-06 DIAGNOSIS — E876 Hypokalemia: Secondary | ICD-10-CM

## 2019-03-06 DIAGNOSIS — Z79899 Other long term (current) drug therapy: Secondary | ICD-10-CM

## 2019-03-06 DIAGNOSIS — Z794 Long term (current) use of insulin: Secondary | ICD-10-CM

## 2019-03-06 NOTE — Telephone Encounter (Signed)
Last labs 03/2018: Lipid, liver, Met 7, HgbA1c, CBC, Microalbumin urine

## 2019-03-06 NOTE — Telephone Encounter (Signed)
Pt has virtual 6 month follow up on 12/7. She would like to know if Dr. Richardson Landry would like her to have lab work done.

## 2019-03-06 NOTE — Telephone Encounter (Signed)
Rep same 

## 2019-03-06 NOTE — Telephone Encounter (Signed)
Lab orders placed and pt is aware 

## 2019-03-07 ENCOUNTER — Other Ambulatory Visit: Payer: Self-pay

## 2019-03-07 DIAGNOSIS — Z20822 Contact with and (suspected) exposure to covid-19: Secondary | ICD-10-CM

## 2019-03-07 DIAGNOSIS — Z20828 Contact with and (suspected) exposure to other viral communicable diseases: Secondary | ICD-10-CM | POA: Diagnosis not present

## 2019-03-10 LAB — NOVEL CORONAVIRUS, NAA: SARS-CoV-2, NAA: NOT DETECTED

## 2019-03-19 DIAGNOSIS — Z79899 Other long term (current) drug therapy: Secondary | ICD-10-CM | POA: Diagnosis not present

## 2019-03-19 DIAGNOSIS — E785 Hyperlipidemia, unspecified: Secondary | ICD-10-CM | POA: Diagnosis not present

## 2019-03-19 DIAGNOSIS — E1100 Type 2 diabetes mellitus with hyperosmolarity without nonketotic hyperglycemic-hyperosmolar coma (NKHHC): Secondary | ICD-10-CM | POA: Diagnosis not present

## 2019-03-19 DIAGNOSIS — Z794 Long term (current) use of insulin: Secondary | ICD-10-CM | POA: Diagnosis not present

## 2019-03-19 DIAGNOSIS — E876 Hypokalemia: Secondary | ICD-10-CM | POA: Diagnosis not present

## 2019-03-19 DIAGNOSIS — I10 Essential (primary) hypertension: Secondary | ICD-10-CM | POA: Diagnosis not present

## 2019-03-20 LAB — CBC WITH DIFFERENTIAL/PLATELET
Basophils Absolute: 0.1 10*3/uL (ref 0.0–0.2)
Basos: 1 %
EOS (ABSOLUTE): 0.4 10*3/uL (ref 0.0–0.4)
Eos: 6 %
Hematocrit: 39.5 % (ref 34.0–46.6)
Hemoglobin: 12.9 g/dL (ref 11.1–15.9)
Immature Grans (Abs): 0 10*3/uL (ref 0.0–0.1)
Immature Granulocytes: 0 %
Lymphocytes Absolute: 2.2 10*3/uL (ref 0.7–3.1)
Lymphs: 30 %
MCH: 29.4 pg (ref 26.6–33.0)
MCHC: 32.7 g/dL (ref 31.5–35.7)
MCV: 90 fL (ref 79–97)
Monocytes Absolute: 0.9 10*3/uL (ref 0.1–0.9)
Monocytes: 12 %
Neutrophils Absolute: 3.9 10*3/uL (ref 1.4–7.0)
Neutrophils: 51 %
Platelets: 242 10*3/uL (ref 150–450)
RBC: 4.39 x10E6/uL (ref 3.77–5.28)
RDW: 14.5 % (ref 11.7–15.4)
WBC: 7.5 10*3/uL (ref 3.4–10.8)

## 2019-03-20 LAB — LIPID PANEL
Chol/HDL Ratio: 2.6 ratio (ref 0.0–4.4)
Cholesterol, Total: 174 mg/dL (ref 100–199)
HDL: 66 mg/dL (ref >39)
LDL Chol Calc (NIH): 84 mg/dL (ref 0–99)
Triglycerides: 141 mg/dL (ref 0–149)
VLDL Cholesterol Cal: 24 mg/dL (ref 5–40)

## 2019-03-20 LAB — MICROALBUMIN / CREATININE URINE RATIO
Creatinine, Urine: 89.2 mg/dL
Microalb/Creat Ratio: 18 mg/g creat (ref 0–29)
Microalbumin, Urine: 15.8 ug/mL

## 2019-03-20 LAB — BASIC METABOLIC PANEL
BUN/Creatinine Ratio: 20 (ref 12–28)
BUN: 19 mg/dL (ref 8–27)
CO2: 25 mmol/L (ref 20–29)
Calcium: 9.8 mg/dL (ref 8.7–10.3)
Chloride: 105 mmol/L (ref 96–106)
Creatinine, Ser: 0.93 mg/dL (ref 0.57–1.00)
GFR calc Af Amer: 73 mL/min/{1.73_m2} (ref >59)
GFR calc non Af Amer: 63 mL/min/{1.73_m2} (ref >59)
Glucose: 68 mg/dL (ref 65–99)
Potassium: 4.1 mmol/L (ref 3.5–5.2)
Sodium: 144 mmol/L (ref 134–144)

## 2019-03-20 LAB — HEPATIC FUNCTION PANEL
ALT: 18 IU/L (ref 0–32)
AST: 20 IU/L (ref 0–40)
Albumin: 4.5 g/dL (ref 3.8–4.8)
Alkaline Phosphatase: 86 IU/L (ref 39–117)
Bilirubin Total: 0.3 mg/dL (ref 0.0–1.2)
Bilirubin, Direct: 0.1 mg/dL (ref 0.00–0.40)
Total Protein: 7.4 g/dL (ref 6.0–8.5)

## 2019-03-20 LAB — HEMOGLOBIN A1C
Est. average glucose Bld gHb Est-mCnc: 148 mg/dL
Hgb A1c MFr Bld: 6.8 % — ABNORMAL HIGH (ref 4.8–5.6)

## 2019-03-25 ENCOUNTER — Other Ambulatory Visit: Payer: Self-pay | Admitting: Family Medicine

## 2019-03-25 ENCOUNTER — Ambulatory Visit (INDEPENDENT_AMBULATORY_CARE_PROVIDER_SITE_OTHER): Payer: Medicare Other | Admitting: Family Medicine

## 2019-03-25 ENCOUNTER — Other Ambulatory Visit: Payer: Self-pay

## 2019-03-25 ENCOUNTER — Encounter: Payer: Self-pay | Admitting: Family Medicine

## 2019-03-25 DIAGNOSIS — E1100 Type 2 diabetes mellitus with hyperosmolarity without nonketotic hyperglycemic-hyperosmolar coma (NKHHC): Secondary | ICD-10-CM

## 2019-03-25 DIAGNOSIS — Z794 Long term (current) use of insulin: Secondary | ICD-10-CM | POA: Diagnosis not present

## 2019-03-25 DIAGNOSIS — R21 Rash and other nonspecific skin eruption: Secondary | ICD-10-CM | POA: Diagnosis not present

## 2019-03-25 DIAGNOSIS — E785 Hyperlipidemia, unspecified: Secondary | ICD-10-CM

## 2019-03-25 DIAGNOSIS — I10 Essential (primary) hypertension: Secondary | ICD-10-CM | POA: Diagnosis not present

## 2019-03-25 MED ORDER — FLOVENT HFA 44 MCG/ACT IN AERO: INHALATION_SPRAY | RESPIRATORY_TRACT | 5 refills | Status: DC

## 2019-03-25 MED ORDER — AMLODIPINE BESYLATE 5 MG PO TABS: 5.0000 mg | ORAL_TABLET | Freq: Every day | ORAL | 1 refills | Status: DC

## 2019-03-25 MED ORDER — LOSARTAN POTASSIUM 100 MG PO TABS: 100.0000 mg | ORAL_TABLET | Freq: Every day | ORAL | 1 refills | Status: DC

## 2019-03-25 MED ORDER — LOVASTATIN 20 MG PO TABS: 20.0000 mg | ORAL_TABLET | Freq: Every day | ORAL | 1 refills | Status: DC

## 2019-03-25 MED ORDER — NYSTATIN 100000 UNIT/GM EX CREA: TOPICAL_CREAM | CUTANEOUS | 0 refills | Status: DC

## 2019-03-25 MED ORDER — FLUTICASONE PROPIONATE 50 MCG/ACT NA SUSP: NASAL | 5 refills | Status: DC

## 2019-03-25 MED ORDER — GLIPIZIDE 5 MG PO TABS: ORAL_TABLET | ORAL | 1 refills | Status: DC

## 2019-03-25 NOTE — Progress Notes (Signed)
Subjective:  Audio only  Patient ID: Bianca Arroyo, female    DOB: 03-15-1951, 68 y.o.   MRN: [id number]  Hypertension This is a chronic problem. Risk factors for coronary artery disease include diabetes mellitus. There are no compliance problems (pt is taking Amlodipine 5 mg daily, Cozaar 100 mg daily).   Diabetes She presents for her follow-up diabetic visit. She has type 2 diabetes mellitus. There are no hypoglycemic associated symptoms. There are no diabetic associated symptoms. There are no hypoglycemic complications. There are no diabetic complications. Compliance with diabetes treatment: pt is taking Glipizide 5 mg BID  She sees a podiatrist.Eye exam is current.   Pt states her lips are cracking in the corners. This has been going on for a couple of weeks. Pt has tried [employer].  Virtual Visit via Telephone Note  I connected with Bianca Arroyo on 03/25/19 at  1:10 PM EST by telephone and verified that I am speaking with the correct person using two identifiers.  Location: Patient: home Provider: office   I discussed the limitations, risks, security and privacy concerns of performing an evaluation and management service by telephone and the availability of in person appointments. I also discussed with the patient that there may be a patient responsible charge related to this service. The patient expressed understanding and agreed to proceed.   History of Present Illness:    Observations/Objective:   Assessment and Plan:   Follow Up Instructions:    I discussed the assessment and treatment plan with the patient. The patient was provided an opportunity to ask questions and all were answered. The patient agreed with the plan and demonstrated an understanding of the instructions.   The patient was advised to call back or seek an in-person evaluation if the symptoms worsen or if the condition fails to improve as anticipated.  I provided 43minutes of non-face-to-face  time during this encounter. Results for orders placed or performed in visit on 03/07/19  Novel Coronavirus, NAA ([facility])   Specimen: Nasopharyngeal(NP) swabs in vial transport medium   NASOPHARYNGE  TESTING  Result Value Ref Range   SARS-CoV-2, NAA Not Detected Not Detected   Recent Results (from the past 2160 hour(s))  Novel Coronavirus, NAA ([facility])     Status: None   Collection Time: 03/07/19 12:35 PM   Specimen: Nasopharyngeal(NP) swabs in vial transport medium   NASOPHARYNGE  TESTING  Result Value Ref Range   SARS-CoV-2, NAA Not Detected Not Detected    Comment: This nucleic acid amplification test was developed and its performance characteristics determined by [employer]. Nucleic acid amplification tests include PCR and TMA. This test has not been FDA cleared or approved. This test has been authorized by FDA under an Emergency Use Authorization (EUA). This test is only authorized for the duration of time the declaration that circumstances exist justifying the authorization of the emergency use of in vitro diagnostic tests for detection of SARS-CoV-2 virus and/or diagnosis of COVID-19 infection under section 564(b)(1) of the Act, 21 U.S.C. [id number]) (1), unless the authorization is terminated or revoked sooner. When diagnostic testing is negative, the possibility of a false negative result should be considered in the context of a patient's recent exposures and the presence of clinical signs and symptoms consistent with COVID-19. An individual without symptoms of COVID-19 and who is not shedding SARS-CoV-2 virus would  expect to have a negative (not detected) result in this assay.   Lipid Profile     Status: None  Collection Time: 03/19/19 10:49 AM  Result Value Ref Range   Cholesterol, Total 174 100 - 199 mg/dL   Triglycerides 141 0 - 149 mg/dL   HDL 66 >39 mg/dL   VLDL Cholesterol Cal 24 5 - 40 mg/dL   LDL Chol Calc (NIH) 84 0 - 99 mg/dL   Chol/HDL Ratio  2.6 0.0 - 4.4 ratio    Comment:                                   T. Chol/HDL Ratio                                             Men  Women                               1/2 Avg.Risk  3.4    3.3                                   Avg.Risk  5.0    4.4                                2X Avg.Risk  9.6    7.1                                3X Avg.Risk 23.4   11.0   Hepatic function panel     Status: None   Collection Time: 03/19/19 10:49 AM  Result Value Ref Range   Total Protein 7.4 6.0 - 8.5 g/dL   Albumin 4.5 3.8 - 4.8 g/dL   Bilirubin Total 0.3 0.0 - 1.2 mg/dL   Bilirubin, Direct 0.10 0.00 - 0.40 mg/dL   Alkaline Phosphatase 86 39 - 117 IU/L   AST 20 0 - 40 IU/L   ALT 18 0 - 32 IU/L  Basic Metabolic Panel (BMET)     Status: None   Collection Time: 03/19/19 10:49 AM  Result Value Ref Range   Glucose 68 65 - 99 mg/dL   BUN 19 8 - 27 mg/dL   Creatinine, Ser 0.93 0.57 - 1.00 mg/dL   GFR calc non Af Amer 63 >59 mL/min/1.73   GFR calc Af Amer 73 >59 mL/min/1.73   BUN/Creatinine Ratio 20 12 - 28   Sodium 144 134 - 144 mmol/L   Potassium 4.1 3.5 - 5.2 mmol/L   Chloride 105 96 - 106 mmol/L   CO2 25 20 - 29 mmol/L   Calcium 9.8 8.7 - 10.3 mg/dL  Hemoglobin A1c     Status: Abnormal   Collection Time: 03/19/19 10:49 AM  Result Value Ref Range   Hgb A1c MFr Bld 6.8 (H) 4.8 - 5.6 %    Comment:          Prediabetes: 5.7 - 6.4          Diabetes: >6.4          Glycemic control for adults with diabetes: <7.0    Est. average glucose Bld gHb Est-mCnc 148 mg/dL  CBC with Differential  Status: None   Collection Time: 03/19/19 10:49 AM  Result Value Ref Range   WBC 7.5 3.4 - 10.8 x10E3/uL   RBC 4.39 3.77 - 5.28 x10E6/uL   Hemoglobin 12.9 11.1 - 15.9 g/dL   Hematocrit 39.5 34.0 - 46.6 %   MCV 90 79 - 97 fL   MCH 29.4 26.6 - 33.0 pg   MCHC 32.7 31.5 - 35.7 g/dL   RDW 14.5 11.7 - 15.4 %   Platelets 242 150 - 450 x10E3/uL   Neutrophils 51 Not Estab. %   Lymphs 30 Not Estab. %   Monocytes  12 Not Estab. %   Eos 6 Not Estab. %   Basos 1 Not Estab. %   Neutrophils Absolute 3.9 1.4 - 7.0 x10E3/uL   Lymphocytes Absolute 2.2 0.7 - 3.1 x10E3/uL   Monocytes Absolute 0.9 0.1 - 0.9 x10E3/uL   EOS (ABSOLUTE) 0.4 0.0 - 0.4 x10E3/uL   Basophils Absolute 0.1 0.0 - 0.2 x10E3/uL   Immature Granulocytes 0 Not Estab. %   Immature Grans (Abs) 0.0 0.0 - 0.1 x10E3/uL  Urine Microalbumin w/creat. ratio     Status: None   Collection Time: 03/19/19 10:49 AM  Result Value Ref Range   Creatinine, Urine 89.2 Not Estab. mg/dL   Microalbumin, Urine 15.8 Not Estab. ug/mL   Microalb/Creat Ratio 18 0 - 29 mg/g creat    Comment:                        Normal:                0 -  29                        Moderately increased: 30 - 300                        Severely increased:       >300    Blood pressure medicine and blood pressure levels reviewed today with patient. Compliant with blood pressure medicine. States does not miss a dose. No obvious side effects. Blood pressure generally good when checked elsewhere. Watching salt intake.   Patient continues to take lipid medication regularly. No obvious side effects from it. Generally does not miss a dose. Prior blood work results are reviewed with patient. Patient continues to work on fat intake in diet  Patient claims compliance with diabetes medication. No obvious side effects. Reports no substantial low sugar spells. Most numbers are generally in good range when checked fasting. Generally does not miss a dose of medication. Watching diabetic diet closely  Patient has already had flu shot  Overall watching diet relatively closely  Walking frequently several times per week.  Tries to get 10,000 steps  Vicente Males, LPN    Review of Systems No headache, no major weight loss or weight gain, no chest pain no back pain abdominal pain no change in bowel habits complete ROS otherwise negative     Objective:   Physical Exam  Virtual       Assessment & Plan:  +1 type 2 diabetes.  A1c higher at 6.8%.  Still considered within good level discussed will maintain same  2.  Hypertension.  Blood pressure good when checked.  Compliance discussed medications refilled  3.  Hyperlipidemia.  Blood work reviewed.  Diet discussed.  Meds refilled  4.  Per Steva Colder nature of the rash discussed.  We will will add nystatin cream 4 times daily to affected area  Diet exercise discussed follow-up in 6 months flu shot already given

## 2019-03-25 NOTE — Addendum Note (Signed)
Addended by: Vicente Males on: 03/25/2019 02:13 PM   Modules accepted: Orders

## 2019-05-23 ENCOUNTER — Encounter: Payer: Self-pay | Admitting: Family Medicine

## 2019-05-24 DIAGNOSIS — Z23 Encounter for immunization: Secondary | ICD-10-CM | POA: Diagnosis not present

## 2019-06-03 ENCOUNTER — Other Ambulatory Visit: Payer: Self-pay | Admitting: Cardiology

## 2019-06-04 ENCOUNTER — Other Ambulatory Visit: Payer: Self-pay | Admitting: *Deleted

## 2019-06-04 MED ORDER — SPIRONOLACTONE 50 MG PO TABS: 50.0000 mg | ORAL_TABLET | Freq: Every day | ORAL | 0 refills | Status: DC

## 2019-06-07 ENCOUNTER — Other Ambulatory Visit: Payer: Self-pay

## 2019-06-07 ENCOUNTER — Encounter (HOSPITAL_COMMUNITY): Payer: Self-pay

## 2019-06-07 ENCOUNTER — Encounter (HOSPITAL_COMMUNITY)
Admission: RE | Admit: 2019-06-07 | Discharge: 2019-06-07 | Disposition: A | Discharge status: Home / Self Care | Payer: Medicare Other | Source: Ambulatory Visit | Attending: Gastroenterology | Admitting: Gastroenterology

## 2019-06-07 ENCOUNTER — Other Ambulatory Visit (HOSPITAL_COMMUNITY)
Admission: RE | Admit: 2019-06-07 | Discharge: 2019-06-07 | Disposition: A | Discharge status: Home / Self Care | Payer: Medicare Other | Source: Ambulatory Visit | Attending: Gastroenterology | Admitting: Gastroenterology

## 2019-06-07 DIAGNOSIS — Z01812 Encounter for preprocedural laboratory examination: Secondary | ICD-10-CM | POA: Diagnosis not present

## 2019-06-07 DIAGNOSIS — Z20822 Contact with and (suspected) exposure to covid-19: Secondary | ICD-10-CM | POA: Diagnosis not present

## 2019-06-07 LAB — SARS CORONAVIRUS 2 (TAT 6-24 HRS): SARS Coronavirus 2: NEGATIVE

## 2019-06-11 ENCOUNTER — Ambulatory Visit (HOSPITAL_COMMUNITY)
Admission: RE | Admit: 2019-06-11 | Discharge: 2019-06-11 | Disposition: A | Discharge status: Home / Self Care | Payer: Medicare Other | Attending: Gastroenterology | Admitting: Gastroenterology

## 2019-06-11 ENCOUNTER — Encounter (HOSPITAL_COMMUNITY)
Admission: RE | Disposition: A | Discharge status: Home / Self Care | Payer: Self-pay | Source: Home / Self Care | Attending: Gastroenterology

## 2019-06-11 ENCOUNTER — Ambulatory Visit (HOSPITAL_COMMUNITY): Payer: Medicare Other | Admitting: Anesthesiology

## 2019-06-11 DIAGNOSIS — Z1211 Encounter for screening for malignant neoplasm of colon: Secondary | ICD-10-CM | POA: Insufficient documentation

## 2019-06-11 DIAGNOSIS — Z860100 Personal history of colon polyps, unspecified: Secondary | ICD-10-CM

## 2019-06-11 DIAGNOSIS — Z09 Encounter for follow-up examination after completed treatment for conditions other than malignant neoplasm: Secondary | ICD-10-CM | POA: Diagnosis not present

## 2019-06-11 DIAGNOSIS — I10 Essential (primary) hypertension: Secondary | ICD-10-CM | POA: Diagnosis not present

## 2019-06-11 DIAGNOSIS — K635 Polyp of colon: Secondary | ICD-10-CM | POA: Insufficient documentation

## 2019-06-11 DIAGNOSIS — G4730 Sleep apnea, unspecified: Secondary | ICD-10-CM | POA: Diagnosis not present

## 2019-06-11 DIAGNOSIS — J45909 Unspecified asthma, uncomplicated: Secondary | ICD-10-CM | POA: Insufficient documentation

## 2019-06-11 DIAGNOSIS — K648 Other hemorrhoids: Secondary | ICD-10-CM | POA: Insufficient documentation

## 2019-06-11 DIAGNOSIS — Z7984 Long term (current) use of oral hypoglycemic drugs: Secondary | ICD-10-CM | POA: Insufficient documentation

## 2019-06-11 DIAGNOSIS — E785 Hyperlipidemia, unspecified: Secondary | ICD-10-CM | POA: Diagnosis not present

## 2019-06-11 DIAGNOSIS — D126 Benign neoplasm of colon, unspecified: Secondary | ICD-10-CM | POA: Diagnosis not present

## 2019-06-11 DIAGNOSIS — K644 Residual hemorrhoidal skin tags: Secondary | ICD-10-CM | POA: Diagnosis not present

## 2019-06-11 DIAGNOSIS — E119 Type 2 diabetes mellitus without complications: Secondary | ICD-10-CM | POA: Insufficient documentation

## 2019-06-11 DIAGNOSIS — Z79899 Other long term (current) drug therapy: Secondary | ICD-10-CM | POA: Insufficient documentation

## 2019-06-11 DIAGNOSIS — Z7982 Long term (current) use of aspirin: Secondary | ICD-10-CM | POA: Insufficient documentation

## 2019-06-11 DIAGNOSIS — D122 Benign neoplasm of ascending colon: Secondary | ICD-10-CM | POA: Diagnosis not present

## 2019-06-11 DIAGNOSIS — K5730 Diverticulosis of large intestine without perforation or abscess without bleeding: Secondary | ICD-10-CM | POA: Insufficient documentation

## 2019-06-11 DIAGNOSIS — D123 Benign neoplasm of transverse colon: Secondary | ICD-10-CM | POA: Diagnosis not present

## 2019-06-11 HISTORY — PX: COLONOSCOPY WITH PROPOFOL: SHX5780

## 2019-06-11 HISTORY — PX: POLYPECTOMY: SHX5525

## 2019-06-11 LAB — GLUCOSE, CAPILLARY
Glucose-Capillary: 132 mg/dL — ABNORMAL HIGH (ref 70–99)
Glucose-Capillary: 109 mg/dL — ABNORMAL HIGH (ref 70–99)

## 2019-06-11 SURGERY — COLONOSCOPY WITH PROPOFOL
Anesthesia: General

## 2019-06-11 MED ORDER — LIDOCAINE 2% (20 MG/ML) 5 ML SYRINGE
INTRAMUSCULAR | Status: AC
  Filled 2019-06-11: qty 5

## 2019-06-11 MED ORDER — HYDROMORPHONE HCL 1 MG/ML IJ SOLN: 0.2500 mg | INTRAMUSCULAR | Status: DC | PRN

## 2019-06-11 MED ORDER — MIDAZOLAM HCL 2 MG/2ML IJ SOLN: 0.5000 mg | Freq: Once | INTRAMUSCULAR | Status: DC | PRN

## 2019-06-11 MED ORDER — PROPOFOL 10 MG/ML IV BOLUS
INTRAVENOUS | Status: AC
  Filled 2019-06-11: qty 40

## 2019-06-11 MED ORDER — KETAMINE HCL 50 MG/5ML IJ SOSY
PREFILLED_SYRINGE | INTRAMUSCULAR | Status: AC
  Filled 2019-06-11: qty 5

## 2019-06-11 MED ORDER — PROMETHAZINE HCL 25 MG/ML IJ SOLN: 6.2500 mg | INTRAMUSCULAR | Status: DC | PRN

## 2019-06-11 MED ORDER — HYDROCODONE-ACETAMINOPHEN 7.5-325 MG PO TABS: 1.0000 | ORAL_TABLET | Freq: Once | ORAL | Status: DC | PRN

## 2019-06-11 MED ORDER — PROPOFOL 500 MG/50ML IV EMUL
INTRAVENOUS | Status: DC | PRN
  Administered 2019-06-11: 175 ug/kg/min via INTRAVENOUS
  Administered 2019-06-11: 100 ug/kg/min via INTRAVENOUS

## 2019-06-11 MED ORDER — KETAMINE HCL 10 MG/ML IJ SOLN
INTRAMUSCULAR | Status: DC | PRN
  Administered 2019-06-11: 20 mg via INTRAVENOUS

## 2019-06-11 MED ORDER — PROPOFOL 10 MG/ML IV BOLUS
INTRAVENOUS | Status: DC | PRN
  Administered 2019-06-11: 20 mg via INTRAVENOUS
  Administered 2019-06-11: 40 mg via INTRAVENOUS
  Administered 2019-06-11: 20 mg via INTRAVENOUS
  Administered 2019-06-11: 40 mg via INTRAVENOUS

## 2019-06-11 MED ORDER — STERILE WATER FOR IRRIGATION IR SOLN
Status: DC | PRN
  Administered 2019-06-11: 1.5 mL

## 2019-06-11 MED ORDER — LACTATED RINGERS IV SOLN: INTRAVENOUS | Status: DC

## 2019-06-11 MED ORDER — LIDOCAINE HCL (CARDIAC) PF 50 MG/5ML IV SOSY
PREFILLED_SYRINGE | INTRAVENOUS | Status: DC | PRN
  Administered 2019-06-11: 60 mg via INTRAVENOUS

## 2019-06-11 NOTE — Anesthesia Preprocedure Evaluation (Signed)
Anesthesia Evaluation  Patient identified by MRN, date of birth, ID band Patient awake    Reviewed: Allergy & Precautions, NPO status , Patient's Chart, lab work & pertinent test results  Airway Mallampati: I  TM Distance: >3 FB Neck ROM: Full    Dental no notable dental hx. (+) Teeth Intact   Pulmonary asthma , sleep apnea ,  Reports cough variant asthma Last Alb 2-3 weeks ago Uses flovent/flonase regularly Denies limitations OSA in past - reports unable to tolerate CPAP   Pulmonary exam normal breath sounds clear to auscultation       Cardiovascular Exercise Tolerance: Good hypertension, Pt. on medications Normal cardiovascular examI+ Valvular Problems/Murmurs MR  Rhythm:Regular Rate:Normal  Mild MR on 2019 echo noted -normal EF Good ET Denies CP Denies ever using NTG   Neuro/Psych negative neurological ROS  negative psych ROS   GI/Hepatic negative GI ROS, Neg liver ROS, Bowel prep,H/o polyps here for surveillance    Endo/Other  negative endocrine ROSdiabetes, Type 2, Oral Hypoglycemic Agents  Renal/GU negative Renal ROS  negative genitourinary   Musculoskeletal negative musculoskeletal ROS (+)   Abdominal   Peds negative pediatric ROS (+)  Hematology negative hematology ROS (+)   Anesthesia Other Findings   Reproductive/Obstetrics negative OB ROS                             Anesthesia Physical Anesthesia Plan  ASA: II  Anesthesia Plan: General   Post-op Pain Management:    Induction: Intravenous  PONV Risk Score and Plan: 3 and TIVA, Propofol infusion, Treatment may vary due to age or medical condition and Ondansetron  Airway Management Planned: Simple Face Mask and Nasal Cannula  Additional Equipment:   Intra-op Plan:   Post-operative Plan:   Informed Consent: I have reviewed the patients History and Physical, chart, labs and discussed the procedure including the  risks, benefits and alternatives for the proposed anesthesia with the patient or authorized representative who has indicated his/her understanding and acceptance.     Dental advisory given  Plan Discussed with: CRNA  Anesthesia Plan Comments: (Plan Full PPE use  Plan GA with GETA as needed d/w pt -WTP with same after Q&A)        Anesthesia Quick Evaluation

## 2019-06-11 NOTE — Discharge Instructions (Signed)
You have small internal hemorrhoids and diverticulosis IN YOUR LEFT AND RIGHT COLON. YOU HAD FOUR SMALL POLYPS REMOVED.    DRINK WATER TO KEEP YOUR URINE LIGHT YELLOW.  FOLLOW A HIGH FIBER DIET. AVOID ITEMS THAT CAUSE BLOATING. See info below.   USE PREPARATION H FOUR TIMES  A DAY IF NEEDED TO RELIEVE RECTAL PAIN/PRESSURE/BLEEDING.   YOUR BIOPSY RESULTS WILL BE BACK IN 5 BUSINESS DAYS.  YOUR next colonoscopy WILL BE SCHEDULED BASED ON YOUR FINAL PATHOLOGY REPORT.   Colonoscopy Care After Read the instructions outlined below and refer to this sheet in the next week. These discharge instructions provide you with general information on caring for yourself after you leave the hospital. While your treatment has been planned according to the most current medical practices available, unavoidable complications occasionally occur. If you have any problems or questions after discharge, call DR. Cortez Steelman, [phone].  ACTIVITY  You may resume your regular activity, but move at a slower pace for the next 24 hours.   Take frequent rest periods for the next 24 hours.   Walking will help get rid of the air and reduce the bloated feeling in your belly (abdomen).   No driving for 24 hours (because of the medicine (anesthesia) used during the test).   You may shower.   Do not sign any important legal documents or operate any machinery for 24 hours (because of the anesthesia used during the test).    NUTRITION  Drink plenty of fluids.   You may resume your normal diet as instructed by your doctor.   Begin with a light meal and progress to your normal diet. Heavy or fried foods are harder to digest and may make you feel sick to your stomach (nauseated).   Avoid alcoholic beverages for 24 hours or as instructed.    MEDICATIONS  You may resume your normal medications.   WHAT YOU CAN EXPECT TODAY  Some feelings of bloating in the abdomen.   Passage of more gas than usual.   Spotting  of blood in your stool or on the toilet paper  .  IF YOU HAD POLYPS REMOVED DURING THE COLONOSCOPY:  Eat a soft diet IF YOU HAVE NAUSEA, BLOATING, ABDOMINAL PAIN, OR VOMITING.    FINDING OUT THE RESULTS OF YOUR TEST Not all test results are available during your visit. DR. Oneida Alar WILL CALL YOU WITHIN 14 DAYS OF YOUR PROCEDUE WITH YOUR RESULTS. Do not assume everything is normal if you have not heard from DR. Samaria Anes, CALL HER OFFICE AT [phone].  SEEK IMMEDIATE MEDICAL ATTENTION AND CALL THE OFFICE: [phone] IF:  You have more than a spotting of blood in your stool.   Your belly is swollen (abdominal distention).   You are nauseated or vomiting.   You have a temperature over 101F.   You have abdominal pain or discomfort that is severe or gets worse throughout the day.  High-Fiber Diet A high-fiber diet changes your normal diet to include more whole grains, legumes, fruits, and vegetables. Changes in the diet involve replacing refined carbohydrates with unrefined foods. The calorie level of the diet is essentially unchanged. The Dietary Reference Intake (recommended amount) for adult males is 38 grams per day. For adult females, it is 25 grams per day. Pregnant and lactating women should consume 28 grams of fiber per day. Fiber is the intact part of a plant that is not broken down during digestion. Functional fiber is fiber that has been isolated from the plant to  provide a beneficial effect in the body.  PURPOSE Increase stool bulk.  Ease and regulate bowel movements.  Lower cholesterol.  REDUCE RISK OF COLON CANCER  INDICATIONS THAT YOU NEED MORE FIBER Constipation and hemorrhoids.  Uncomplicated diverticulosis (intestine condition) and irritable bowel syndrome.  Weight management.  As a protective measure against hardening of the arteries (atherosclerosis), diabetes, and cancer.   GUIDELINES FOR INCREASING FIBER IN THE DIET Start adding fiber to the diet slowly. A  gradual increase of about 5 more grams (2 servings of most fruits or vegetables) per day is best. Too rapid an increase in fiber may result in constipation, flatulence, and bloating.  Drink enough water and fluids to keep your urine clear or pale yellow. Water, juice, or caffeine-free drinks are recommended. Not drinking enough fluid may cause constipation.  Eat a variety of high-fiber foods rather than one type of fiber.  Try to increase your intake of fiber through using high-fiber foods rather than fiber pills or supplements that contain small amounts of fiber.  The goal is to change the types of food eaten. Do not supplement your present diet with high-fiber foods, but replace foods in your present diet.      Polyps, Colon  A polyp is extra tissue that grows inside your body. Colon polyps grow in the large intestine. The large intestine, also called the colon, is part of your digestive system. It is a long, hollow tube at the end of your digestive tract where your body makes and stores stool. Most polyps are not dangerous. They are benign. This means they are not cancerous. But over time, some types of polyps can turn into cancer. Polyps that are smaller than a pea are usually not harmful. But larger polyps could someday become or may already be cancerous. To be safe, doctors remove all polyps and test them.   PREVENTION There is not one sure way to prevent polyps. You might be able to lower your risk of getting them if you:  Eat more fruits and vegetables and less fatty food.   Do not smoke.   Avoid alcohol.   Exercise every day.   Lose weight if you are overweight.   Eating more calcium and folate can also lower your risk of getting polyps. Some foods that are rich in calcium are milk, cheese, and broccoli. Some foods that are rich in folate are chickpeas, kidney beans, and spinach.    Diverticulosis Diverticulosis is a common condition that develops when small pouches  (diverticula) form in the wall of the colon. The risk of diverticulosis increases with age. It happens more often in people who eat a low-fiber diet. Most individuals with diverticulosis have no symptoms. Those individuals with symptoms usually experience belly (abdominal) pain, constipation, or loose stools (diarrhea).  HOME CARE INSTRUCTIONS  Increase the amount of fiber in your diet as directed by your caregiver or dietician. This may reduce symptoms of diverticulosis.   Drink at least 6 to 8 glasses of water each day to prevent constipation.   Try not to strain when you have a bowel movement.   Avoiding nuts and seeds to prevent complications is NOT NECESSARY.   FOODS HAVING HIGH FIBER CONTENT INCLUDE:  Fruits. Apple, peach, pear, tangerine, raisins, prunes.   Vegetables. Brussels sprouts, asparagus, broccoli, cabbage, carrot, cauliflower, romaine lettuce, spinach, summer squash, tomato, winter squash, zucchini.   Starchy Vegetables. Baked beans, kidney beans, lima beans, split peas, lentils, potatoes (with skin).    SEEK IMMEDIATE  MEDICAL CARE IF:  You develop increasing pain or severe bloating.   You have an oral temperature above 101F.   You develop vomiting or bowel movements that are bloody or black.

## 2019-06-11 NOTE — Op Note (Signed)
[facility] Patient Name: Bianca Arroyo Procedure Date: 06/11/2019 9:59 AM MRN: [id number] Date of Birth: 1950/09/20 Attending MD: Barney Drain MD, MD CSN: [id number] Age: 69 Admit Type: Outpatient Procedure:                Colonoscopy WITH COLD SNARE POLYPECTOMY Indications:              Personal history of colonic polyps Providers:                Barney Drain MD, MD, Charlsie Quest. Theda Sers RN, RN,                            Randa Spike, Technician Referring MD:             Rosemary Holms, MD Medicines:                Propofol per Anesthesia Complications:            No immediate complications. Estimated Blood Loss:     Estimated blood loss was minimal. Procedure:                Pre-Anesthesia Assessment:                           - Prior to the procedure, a History and Physical                            was performed, and patient medications and                            allergies were reviewed. The patient's tolerance of                            previous anesthesia was also reviewed. The risks                            and benefits of the procedure and the sedation                            options and risks were discussed with the patient.                            All questions were answered, and informed consent                            was obtained. Prior Anticoagulants: The patient has                            taken no previous anticoagulant or antiplatelet                            agents except for aspirin. ASA Grade Assessment: II                            - A patient with mild systemic disease. After  reviewing the risks and benefits, the patient was                            deemed in satisfactory condition to undergo the                            procedure. After obtaining informed consent, the                            colonoscope was passed under direct vision.                            Throughout the procedure, the  patient's blood                            pressure, pulse, and oxygen saturations were                            monitored continuously. The [id number]                            scope was introduced through the anus and advanced                            to the the cecum, identified by appendiceal orifice                            and ileocecal valve. The colonoscopy was somewhat                            difficult due to a tortuous colon. Successful                            completion of the procedure was aided by                            straightening and shortening the scope to obtain                            bowel loop reduction and COLOWRAP. The patient                            tolerated the procedure fairly well. The quality of                            the bowel preparation was good. The ileocecal                            valve, appendiceal orifice, and rectum were                            photographed. Scope In: 10:43:11 AM Scope Out: 11:00:37 AM Scope Withdrawal Time: 0 hours 14 minutes 59 seconds  Total Procedure Duration: 0 hours  17 minutes 26 seconds  Findings:      Four sessile polyps were found in the splenic flexure, mid transverse       colon, hepatic flexure and distal ascending colon. The polyps were 2 to       5 mm in size. These polyps were removed with a cold snare. Resection and       retrieval were complete.      Multiple small and large-mouthed diverticula were found in the sigmoid       colon and ascending colon.      External and internal hemorrhoids were found. Impression:               - Four 2 to 5 mm polyps at the splenic flexure, in                            the mid transverse colon, at the hepatic flexure                            and in the distal ascending colon, removed with a                            cold snare. Resected and retrieved.                           - MILD Diverticulosis in the sigmoid colon and in                             the ascending colon.                           - External and internal hemorrhoids. Moderate Sedation:      Per Anesthesia Care Recommendation:           - Patient has a contact number available for                            emergencies. The signs and symptoms of potential                            delayed complications were discussed with the                            patient. Return to normal activities tomorrow.                            Written discharge instructions were provided to the                            patient.                           - High fiber diet.                           - Continue present medications.                           -  Await pathology results.                           - Repeat colonoscopy date to be determined after                            pending pathology results are reviewed for                            surveillance. Procedure Code(s):        --- Professional ---                           [phone], Colonoscopy, flexible; with removal of                            tumor(s), polyp(s), or other lesion(s) by snare                            technique Diagnosis Code(s):        --- Professional ---                           K63.5, Polyp of colon                           K64.8, Other hemorrhoids                           Z86.010, Personal history of colonic polyps                           K57.30, Diverticulosis of large intestine without                            perforation or abscess without bleeding CPT copyright 2019 [employer]. All rights reserved. The codes documented in this report are preliminary and upon coder review may  be revised to meet current compliance requirements. Barney Drain, MD Barney Drain MD, MD 06/11/2019 11:15:00 AM This report has been signed electronically. Number of Addenda: 0

## 2019-06-11 NOTE — H&P (Addendum)
Primary Care Physician:  Mikey Kirschner, MD Primary Gastroenterologist:  Dr. Oneida Alar  Pre-Procedure History & Physical: HPI:  Bianca Arroyo is a 69 y.o. female here for  [place].  Past Medical History:  Diagnosis Date  . H/O echocardiogram    a. 05/2017: echo showing EF of 65-70%, no regional WMA, moderate LVH, and mild MR.   Marland Kitchen Hyperlipidemia   . Hypertension   . Type 2 diabetes mellitus ([place])     Past Surgical History:  Procedure Laterality Date  . BREAST BIOPSY Left    benign  . CESAREAN SECTION    . COLONOSCOPY    . COLONOSCOPY N/A 12/06/2013   Dr. Oneida Alar: Left colon redundant, diverticulosis, 2 polyps removed, one tubular adenoma the other hyperplastic.  Consider colonoscopy in 5 to 10 years with overtube  . Left wrist surgery    . Right trigger thumb  2013  . tummy tuck      Prior to Admission medications   Medication Sig Start Date End Date Taking? Authorizing Provider  albuterol (PROVENTIL HFA;VENTOLIN HFA) 108 (90 Base) MCG/ACT inhaler Inhale 2 puffs into the lungs every 6 (six) hours as needed for wheezing or shortness of breath. 08/16/17  Yes Mikey Kirschner, MD  amLODipine (NORVASC) 5 MG tablet Take 1 tablet (5 mg total) by mouth daily. 03/25/19  Yes Mikey Kirschner, MD  aspirin 81 MG chewable tablet Chew 81 mg by mouth every morning.    Yes [provider]  carvedilol (COREG) 25 MG tablet Take 1 tablet (25 mg total) by mouth 2 (two) times daily with a meal. 06/03/19  Yes Satira Sark, MD  fluticasone (FLONASE) 50 MCG/ACT nasal spray USE TWO SPRAY(S) IN EACH NOSTRIL ONCE DAILY Patient taking differently: Place 2 sprays into both nostrils daily as needed for allergies.  03/25/19  Yes Mikey Kirschner, MD  fluticasone (FLOVENT HFA) 44 MCG/ACT inhaler Two puffs twice daily Patient taking differently: Inhale 2 puffs into the lungs 2 (two) times daily as needed (shortness of breath).  03/25/19  Yes Mikey Kirschner, MD   glipiZIDE (GLUCOTROL) 5 MG tablet TAKE 1 TABLET BY MOUTH TWICE DAILY BEFORE MEAL(S) Patient taking differently: Take 5 mg by mouth 2 (two) times daily before a meal.  03/25/19  Yes Mikey Kirschner, MD  losartan (COZAAR) 100 MG tablet Take 1 tablet (100 mg total) by mouth daily. 03/25/19  Yes Mikey Kirschner, MD  lovastatin (MEVACOR) 20 MG tablet Take 1 tablet (20 mg total) by mouth daily. 03/25/19  Yes Mikey Kirschner, MD  Multiple Vitamins-Minerals (ALIVE ONCE DAILY WOMENS 50+ PO) Take 1 tablet by mouth every morning.    Yes [provider]  nystatin cream (MYCOSTATIN) Apply QID to corners of mouth Patient taking differently: Apply 1 application topically daily as needed for dry skin.  03/25/19  Yes Mikey Kirschner, MD  spironolactone (ALDACTONE) 50 MG tablet Take 1 tablet (50 mg total) by mouth daily. 06/04/19  Yes Satira Sark, MD  Tetrahydrozoline HCl (VISINE OP) Place 1 drop into both eyes daily as needed (dry eye relief).    Yes [provider]  vitamin B-12 (CYANOCOBALAMIN) 1000 MCG tablet Take 1,000 mcg by mouth once a week.    Yes [provider]  diclofenac (VOLTAREN) 75 MG EC tablet Take 1 tablet (75 mg total) by mouth 2 (two) times daily. Patient not taking: Reported on 06/03/2019 12/03/18   Wallene Huh, DPM  ibuprofen (ADVIL,MOTRIN)  600 MG tablet Take 1 tablet (600 mg total) by mouth every 8 (eight) hours as needed. Patient not taking: Reported on 06/03/2019 06/28/16   Evalee Jefferson, PA-C  polyethylene glycol-electrolytes (TRILYTE) 420 g solution Take 4,000 mLs by mouth as directed. 02/26/19   Samanatha Brammer, Marga Melnick, MD  valACYclovir (VALTREX) 500 MG tablet TAKE 1 TABLET BY MOUTH TWICE DAILY AS NEEDED FOR 3 DAYS FOR FLARES Patient not taking: Reported on 06/03/2019 12/22/17   Kathyrn Drown, MD    Allergies as of 02/26/2019 - Review Complete 02/26/2019  Allergen Reaction Noted  . Colcrys [colchicine]  02/25/2013    Family History  Problem Relation Age  of Onset  . Diabetes Father   . Hypertension Father   . Diabetes Mother   . High blood pressure Mother   . High blood pressure Sister   . Diabetes Sister   . Colon cancer Neg Hx     Social History   Socioeconomic History  . Marital status: Widowed    Spouse name: Not on file  . Number of children: Not on file  . Years of education: Not on file  . Highest education level: Not on file  Occupational History  . Not on file  Tobacco Use  . Smoking status: Never Smoker  . Smokeless tobacco: Never Used  Substance and Sexual Activity  . Alcohol use: Yes    Alcohol/week: 3.0 standard drinks    Types: 3 Standard drinks or equivalent per week    Comment: 3-4 times per week  . Drug use: No  . Sexual activity: Not on file  Other Topics Concern  . Not on file  Social History Narrative  . Not on file   Social Determinants of Health   Financial Resource Strain:   . Difficulty of Paying Living Expenses: Not on file  Food Insecurity:   . Worried About Charity fundraiser in the Last Year: Not on file  . Ran Out of Food in the Last Year: Not on file  Transportation Needs:   . Lack of Transportation (Medical): Not on file  . Lack of Transportation (Non-Medical): Not on file  Physical Activity:   . Days of Exercise per Week: Not on file  . Minutes of Exercise per Session: Not on file  Stress:   . Feeling of Stress : Not on file  Social Connections:   . Frequency of Communication with Friends and Family: Not on file  . Frequency of Social Gatherings with Friends and Family: Not on file  . Attends Religious Services: Not on file  . Active Member of Clubs or Organizations: Not on file  . Attends Archivist Meetings: Not on file  . Marital Status: Not on file  Intimate Partner Violence:   . Fear of Current or Ex-Partner: Not on file  . Emotionally Abused: Not on file  . Physically Abused: Not on file  . Sexually Abused: Not on file    Review of Systems: See HPI,  otherwise negative ROS   Physical Exam: BP (!) 168/84 (BP Location: Right Arm)   Pulse 64   Temp 98.5 F (36.9 C)   Resp 16   SpO2 99%  General:   Alert,  pleasant and cooperative in NAD Head:  Normocephalic and atraumatic. Neck:  Supple; Lungs:  Clear throughout to auscultation.    Heart:  Regular rate and rhythm. Abdomen:  Soft, nontender and nondistended. Normal bowel sounds, without guarding, and without rebound.   Neurologic:  Alert  and  oriented x4;  grossly normal neurologically.  Impression/Plan:     PERSONAL HISTORY OF POLYPS.  PLAN: 1. TCS TODAY. DISCUSSED PROCEDURE, BENEFITS, & RISKS: < 1% chance of medication reaction, bleeding, perforation, ASPIRATION, or rupture of spleen/liver requiring surgery to fix it and missed polyps < 1 cm 10-20% of the time.

## 2019-06-11 NOTE — Anesthesia Procedure Notes (Signed)
Date/Time: 06/11/2019 10:28 AM Performed by: Vista Deck, CRNA Pre-anesthesia Checklist: Patient identified, Emergency Drugs available, Suction available, Timeout performed and Patient being monitored Patient Re-evaluated:Patient Re-evaluated prior to induction Oxygen Delivery Method: Non-rebreather mask

## 2019-06-11 NOTE — Transfer of Care (Signed)
Immediate Anesthesia Transfer of Care Note  Patient: Bianca Arroyo  Procedure(s) Performed: COLONOSCOPY WITH PROPOFOL (N/A ) POLYPECTOMY  Patient Location: PACU  Anesthesia Type:General  Level of Consciousness: awake, alert  and patient cooperative  Airway & Oxygen Therapy: Patient Spontanous Breathing  Post-op Assessment: Report given to RN and Post -op Vital signs reviewed and stable  Post vital signs: Reviewed and stable  Last Vitals:  Vitals Value Taken Time  BP    Temp 97.6   Pulse 77 06/11/19 1109  Resp 19 06/11/19 1109  SpO2 100 % 06/11/19 1109  Vitals shown include unvalidated device data.  Last Pain:  Vitals:   06/11/19 1033  PainSc: 0-No pain         Complications: No apparent anesthesia complications

## 2019-06-11 NOTE — Anesthesia Postprocedure Evaluation (Signed)
Anesthesia Post Note  Patient: Bianca Arroyo  Procedure(s) Performed: COLONOSCOPY WITH PROPOFOL (N/A ) POLYPECTOMY  Patient location during evaluation: Phase II Anesthesia Type: General Level of consciousness: awake and alert and patient cooperative Pain management: satisfactory to patient Vital Signs Assessment: post-procedure vital signs reviewed and stable Respiratory status: spontaneous breathing Cardiovascular status: stable Postop Assessment: no apparent nausea or vomiting Anesthetic complications: no     Last Vitals:  Vitals:   06/11/19 1115 06/11/19 1128  BP: (!) 146/77 (!) 142/79  Pulse: 65 65  Resp: 17 20  Temp:  36.4 C  SpO2: 100% 97%    Last Pain:  Vitals:   06/11/19 1128  TempSrc: Oral  PainSc: 0-No pain                 Cyler Kappes

## 2019-06-12 LAB — SURGICAL PATHOLOGY

## 2019-06-18 NOTE — Progress Notes (Signed)
CC'D TO PCP AND ON RECALL  °

## 2019-06-22 DIAGNOSIS — Z23 Encounter for immunization: Secondary | ICD-10-CM | POA: Diagnosis not present

## 2019-08-12 ENCOUNTER — Other Ambulatory Visit: Payer: Self-pay | Admitting: Student

## 2019-08-14 ENCOUNTER — Other Ambulatory Visit: Payer: Self-pay | Admitting: Student

## 2019-08-16 ENCOUNTER — Other Ambulatory Visit: Payer: Self-pay | Admitting: Family Medicine

## 2019-08-16 NOTE — Telephone Encounter (Signed)
Last med check up dec 2020

## 2019-08-20 ENCOUNTER — Encounter: Payer: Self-pay | Admitting: Family Medicine

## 2019-08-22 NOTE — Telephone Encounter (Signed)
Script faxed to [facility] yesterday and pt was informed

## 2019-08-23 ENCOUNTER — Encounter: Payer: Self-pay | Admitting: Family Medicine

## 2019-09-09 ENCOUNTER — Other Ambulatory Visit: Payer: Self-pay | Admitting: *Deleted

## 2019-09-09 MED ORDER — BLOOD GLUCOSE METER KIT: PACK | 5 refills | Status: AC

## 2019-09-19 ENCOUNTER — Other Ambulatory Visit: Payer: Self-pay | Admitting: *Deleted

## 2019-10-01 ENCOUNTER — Other Ambulatory Visit (HOSPITAL_COMMUNITY): Payer: Self-pay | Admitting: Family Medicine

## 2019-10-01 DIAGNOSIS — Z1231 Encounter for screening mammogram for malignant neoplasm of breast: Secondary | ICD-10-CM

## 2019-10-07 ENCOUNTER — Other Ambulatory Visit (HOSPITAL_COMMUNITY): Payer: Self-pay | Admitting: Nurse Practitioner

## 2019-10-07 ENCOUNTER — Other Ambulatory Visit: Payer: Self-pay

## 2019-10-07 ENCOUNTER — Ambulatory Visit (HOSPITAL_COMMUNITY)
Admission: RE | Admit: 2019-10-07 | Discharge: 2019-10-07 | Disposition: A | Discharge status: Home / Self Care | Payer: Medicare Other | Source: Ambulatory Visit | Attending: Family Medicine | Admitting: Family Medicine

## 2019-10-07 DIAGNOSIS — Z1231 Encounter for screening mammogram for malignant neoplasm of breast: Secondary | ICD-10-CM

## 2019-10-10 ENCOUNTER — Other Ambulatory Visit (HOSPITAL_COMMUNITY): Payer: Self-pay | Admitting: Nurse Practitioner

## 2019-10-10 ENCOUNTER — Other Ambulatory Visit: Payer: Self-pay | Admitting: Family Medicine

## 2019-10-10 DIAGNOSIS — R928 Other abnormal and inconclusive findings on diagnostic imaging of breast: Secondary | ICD-10-CM

## 2019-10-11 NOTE — Telephone Encounter (Signed)
Pt made appt for July 16th at 11:00p.m.

## 2019-10-31 ENCOUNTER — Other Ambulatory Visit (HOSPITAL_COMMUNITY): Payer: Self-pay | Admitting: Nurse Practitioner

## 2019-10-31 ENCOUNTER — Encounter (HOSPITAL_COMMUNITY): Payer: Self-pay

## 2019-10-31 ENCOUNTER — Other Ambulatory Visit: Payer: Self-pay

## 2019-10-31 ENCOUNTER — Ambulatory Visit (HOSPITAL_COMMUNITY)
Admission: RE | Admit: 2019-10-31 | Discharge: 2019-10-31 | Disposition: A | Discharge status: Home / Self Care | Payer: Medicare Other | Source: Ambulatory Visit | Attending: Nurse Practitioner | Admitting: Nurse Practitioner

## 2019-10-31 DIAGNOSIS — R928 Other abnormal and inconclusive findings on diagnostic imaging of breast: Secondary | ICD-10-CM

## 2019-10-31 DIAGNOSIS — R922 Inconclusive mammogram: Secondary | ICD-10-CM | POA: Diagnosis not present

## 2019-10-31 DIAGNOSIS — N6012 Diffuse cystic mastopathy of left breast: Secondary | ICD-10-CM | POA: Diagnosis not present

## 2019-11-01 ENCOUNTER — Encounter: Payer: Self-pay | Admitting: Family Medicine

## 2019-11-01 ENCOUNTER — Ambulatory Visit (INDEPENDENT_AMBULATORY_CARE_PROVIDER_SITE_OTHER): Payer: Medicare Other | Admitting: Family Medicine

## 2019-11-01 VITALS — BP 104/68 | HR 76 | Temp 97.6°F | Ht 62.0 in | Wt 151.0 lb

## 2019-11-01 DIAGNOSIS — E119 Type 2 diabetes mellitus without complications: Secondary | ICD-10-CM | POA: Diagnosis not present

## 2019-11-01 DIAGNOSIS — I10 Essential (primary) hypertension: Secondary | ICD-10-CM | POA: Diagnosis not present

## 2019-11-01 DIAGNOSIS — R200 Anesthesia of skin: Secondary | ICD-10-CM

## 2019-11-01 DIAGNOSIS — R202 Paresthesia of skin: Secondary | ICD-10-CM

## 2019-11-01 DIAGNOSIS — E785 Hyperlipidemia, unspecified: Secondary | ICD-10-CM | POA: Diagnosis not present

## 2019-11-01 LAB — POCT GLYCOSYLATED HEMOGLOBIN (HGB A1C): Hemoglobin A1C: 6.4 % — AB (ref 4.0–5.6)

## 2019-11-01 MED ORDER — LOVASTATIN 20 MG PO TABS: 20.0000 mg | ORAL_TABLET | Freq: Every day | ORAL | 1 refills | Status: DC

## 2019-11-01 MED ORDER — FLUTICASONE PROPIONATE 50 MCG/ACT NA SUSP: NASAL | 1 refills | Status: AC

## 2019-11-01 MED ORDER — FLOVENT HFA 44 MCG/ACT IN AERO: INHALATION_SPRAY | RESPIRATORY_TRACT | 5 refills | Status: DC

## 2019-11-01 MED ORDER — GLIPIZIDE 5 MG PO TABS: ORAL_TABLET | ORAL | 1 refills | Status: DC

## 2019-11-01 NOTE — Progress Notes (Signed)
Patient ID: Bianca Arroyo, female    DOB: Sep 05, 1950, 69 y.o.   MRN: [id number]   Chief Complaint  Patient presents with  . Diabetes   Subjective:    HPI  med check up- dm2.  Pt also noting numbness and tingling in left hand. Started about 5- 6 months ago.  H/o wrist fracture in the left wrist 15 yrs ago. Feels it in all 5 fingers. It's intermittent.  Happens any time during the day.  Morning sugars under 130.  Eye exam within the last year.  optho - Dr. Filbert Schilder, [place], no retiopathy, per pt.  No foot exam in the last year. No sores or ulcers on feet.  No numbness or tingling in feet. No signs of neuropathy.  typing a lot with computer. can't pin down when it happens.   Normal monofiliament exam. Today.  Results for orders placed or performed in visit on 11/01/19  CMP14+EGFR  Result Value Ref Range   Glucose 141 (H) 65 - 99 mg/dL   BUN 20 8 - 27 mg/dL   Creatinine, Ser 0.91 0.57 - 1.00 mg/dL   GFR calc non Af Amer 64 >59 mL/min/1.73   GFR calc Af Amer 74 >59 mL/min/1.73   BUN/Creatinine Ratio 22 12 - 28   Sodium 138 134 - 144 mmol/L   Potassium 5.0 3.5 - 5.2 mmol/L   Chloride 101 96 - 106 mmol/L   CO2 24 20 - 29 mmol/L   Calcium 9.5 8.7 - 10.3 mg/dL   Total Protein 7.0 6.0 - 8.5 g/dL   Albumin 4.3 3.8 - 4.8 g/dL   Globulin, Total 2.7 1.5 - 4.5 g/dL   Albumin/Globulin Ratio 1.6 1.2 - 2.2   Bilirubin Total 0.3 0.0 - 1.2 mg/dL   Alkaline Phosphatase 79 48 - 121 IU/L   AST 20 0 - 40 IU/L   ALT 18 0 - 32 IU/L  Hemoglobin A1c  Result Value Ref Range   Hgb A1c MFr Bld 7.5 (H) 4.8 - 5.6 %   Est. average glucose Bld gHb Est-mCnc 169 mg/dL  Lipid panel  Result Value Ref Range   Cholesterol, Total 179 100 - 199 mg/dL   Triglycerides 85 0 - 149 mg/dL   HDL 69 >39 mg/dL   VLDL Cholesterol Cal 16 5 - 40 mg/dL   LDL Chol Calc (NIH) 94 0 - 99 mg/dL   Chol/HDL Ratio 2.6 0.0 - 4.4 ratio  Microalbumin, urine  Result Value Ref Range   Microalbumin, Urine WILL  FOLLOW   POCT glycosylated hemoglobin (Hb A1C)  Result Value Ref Range   Hemoglobin A1C 6.4 (A) 4.0 - 5.6 %   HbA1c POC (<> result, manual entry)     HbA1c, POC (prediabetic range)     HbA1c, POC (controlled diabetic range)     HTN Pt compliant with BP meds.  No SEs Denies chest pain, sob, LE swelling, or blurry vision.  HLD- doing well no new concerns.  Compliant with meds. No chest pain, palpitations, myalgias or joint pains.   Medical History Manie has a past medical history of H/O echocardiogram, Hyperlipidemia, Hypertension, and Type 2 diabetes mellitus ([place]).   Outpatient Encounter Medications as of 11/01/2019  Medication Sig  . albuterol (PROVENTIL HFA;VENTOLIN HFA) 108 (90 Base) MCG/ACT inhaler Inhale 2 puffs into the lungs every 6 (six) hours as needed for wheezing or shortness of breath.  Marland Kitchen amLODipine (NORVASC) 5 MG tablet Take 1 tablet by mouth once daily  . aspirin 81  MG chewable tablet Chew 81 mg by mouth every morning.   . blood glucose meter kit and supplies Dispense based on patient and insurance preference. Use to test glucose once daily.  (FOR ICD -10 E11.9)  . carvedilol (COREG) 25 MG tablet Take 1 tablet (25 mg total) by mouth 2 (two) times daily with a meal.  . diclofenac (VOLTAREN) 75 MG EC tablet Take 1 tablet (75 mg total) by mouth 2 (two) times daily. (Patient not taking: Reported on 06/03/2019)  . fluticasone (FLONASE) 50 MCG/ACT nasal spray USE TWO SPRAY(S) IN EACH NOSTRIL ONCE DAILY  . fluticasone (FLOVENT HFA) 44 MCG/ACT inhaler Two puffs twice daily  . glipiZIDE (GLUCOTROL) 5 MG tablet TAKE 1 TABLET BY MOUTH TWICE DAILY BEFORE MEAL(S)  . ibuprofen (ADVIL,MOTRIN) 600 MG tablet Take 1 tablet (600 mg total) by mouth every 8 (eight) hours as needed. (Patient not taking: Reported on 06/03/2019)  . losartan (COZAAR) 100 MG tablet Take 1 tablet by mouth once daily  . lovastatin (MEVACOR) 20 MG tablet Take 1 tablet (20 mg total) by mouth daily.  . Multiple  Vitamins-Minerals (ALIVE ONCE DAILY WOMENS 50+ PO) Take 1 tablet by mouth every morning.   . nystatin cream (MYCOSTATIN) Apply QID to corners of mouth (Patient taking differently: Apply 1 application topically daily as needed for dry skin. )  . polyethylene glycol-electrolytes (TRILYTE) 420 g solution Take 4,000 mLs by mouth as directed.  Marland Kitchen spironolactone (ALDACTONE) 50 MG tablet Take 1 tablet by mouth once daily  . Tetrahydrozoline HCl (VISINE OP) Place 1 drop into both eyes daily as needed (dry eye relief).   . valACYclovir (VALTREX) 500 MG tablet TAKE 1 TABLET BY MOUTH TWICE DAILY AS NEEDED FOR 3 DAYS FOR FLARES  . vitamin B-12 (CYANOCOBALAMIN) 1000 MCG tablet Take 1,000 mcg by mouth once a week.   . [DISCONTINUED] fluticasone (FLONASE) 50 MCG/ACT nasal spray USE TWO SPRAY(S) IN EACH NOSTRIL ONCE DAILY (Patient taking differently: Place 2 sprays into both nostrils daily as needed for allergies. )  . [DISCONTINUED] fluticasone (FLOVENT HFA) 44 MCG/ACT inhaler Two puffs twice daily (Patient taking differently: Inhale 2 puffs into the lungs 2 (two) times daily as needed (shortness of breath). )  . [DISCONTINUED] glipiZIDE (GLUCOTROL) 5 MG tablet TAKE 1 TABLET BY MOUTH TWICE DAILY BEFORE MEAL(S) (Patient taking differently: Take 5 mg by mouth 2 (two) times daily before a meal. )  . [DISCONTINUED] lovastatin (MEVACOR) 20 MG tablet Take 1 tablet (20 mg total) by mouth daily.   Facility-Administered Encounter Medications as of 11/01/2019  Medication  . nitroGLYCERIN (NITRODUR - Dosed in mg/24 hr) patch 0.3 mg     Review of Systems  Constitutional: Negative for chills and fever.  HENT: Negative for congestion, rhinorrhea and sore throat.   Respiratory: Negative for cough, shortness of breath and wheezing.   Cardiovascular: Negative for chest pain and leg swelling.  Gastrointestinal: Negative for abdominal pain, diarrhea, nausea and vomiting.  Genitourinary: Negative for dysuria and frequency.    Musculoskeletal: Negative for arthralgias and back pain.  Skin: Negative for rash.  Neurological: Positive for numbness (left wrist). Negative for dizziness, weakness and headaches.    Vitals BP 104/68   Pulse 76   Temp 97.6 F (36.4 C)   Ht 5' 2"  (1.575 m)   Wt 151 lb (68.5 kg)   SpO2 97%   BMI 27.62 kg/m   Objective:   Physical Exam Vitals and nursing note reviewed.  Constitutional:  Appearance: Normal appearance.  HENT:     Head: Normocephalic and atraumatic.     Nose: Nose normal.     Mouth/Throat:     Mouth: Mucous membranes are moist.     Pharynx: Oropharynx is clear.  Eyes:     Extraocular Movements: Extraocular movements intact.     Conjunctiva/sclera: Conjunctivae normal.     Pupils: Pupils are equal, round, and reactive to light.  Cardiovascular:     Rate and Rhythm: Normal rate and regular rhythm.     Pulses: Normal pulses.     Heart sounds: Normal heart sounds.  Pulmonary:     Effort: Pulmonary effort is normal.     Breath sounds: Normal breath sounds. No wheezing, rhonchi or rales.  Musculoskeletal:        General: Normal range of motion.     Right lower leg: No edema.     Left lower leg: No edema.     Comments: +normal rom of left hand/wrist, no swelling erythema or warmth.  Skin:    General: Skin is warm and dry.     Findings: No erythema, lesion or rash.  Neurological:     General: No focal deficit present.     Mental Status: She is alert and oriented to person, place, and time.     Comments: Normal monofilament exam on bilateral feet.  Psychiatric:        Mood and Affect: Mood normal.        Behavior: Behavior normal.      Assessment and Plan   1. Diabetes mellitus without complication (HCC) - POCT glycosylated hemoglobin (Hb A1C) - CMP14+EGFR - Hemoglobin A1c - Lipid panel - Microalbumin, urine - glipiZIDE (GLUCOTROL) 5 MG tablet; TAKE 1 TABLET BY MOUTH TWICE DAILY BEFORE MEAL(S)  Dispense: 180 tablet; Refill: 1  2. Essential  hypertension, benign  3. Hyperlipidemia LDL goal <100 - lovastatin (MEVACOR) 20 MG tablet; Take 1 tablet (20 mg total) by mouth daily.  Dispense: 90 tablet; Refill: 1  4. Numbness and tingling in left hand   Normal monofilament foot exam.  HTN- stable, cont meds. HLD- rechck labs, cont meds. DM2- will recheck labs, cont meds.  Numbness/tingling left wrist hand- use cock up wrist splint, tylenol/diclofenac prn. Call or rto if worsening.  Pt to get labs this week.  Will call with results.  Cont current meds at this time.  F/u [place] prn.

## 2019-11-05 DIAGNOSIS — E119 Type 2 diabetes mellitus without complications: Secondary | ICD-10-CM | POA: Diagnosis not present

## 2019-11-06 LAB — HEMOGLOBIN A1C
Est. average glucose Bld gHb Est-mCnc: 169 mg/dL
Hgb A1c MFr Bld: 7.5 % — ABNORMAL HIGH (ref 4.8–5.6)

## 2019-11-06 LAB — CMP14+EGFR
ALT: 18 IU/L (ref 0–32)
AST: 20 IU/L (ref 0–40)
Albumin/Globulin Ratio: 1.6 (ref 1.2–2.2)
Albumin: 4.3 g/dL (ref 3.8–4.8)
Alkaline Phosphatase: 79 IU/L (ref 48–121)
BUN/Creatinine Ratio: 22 (ref 12–28)
BUN: 20 mg/dL (ref 8–27)
Bilirubin Total: 0.3 mg/dL (ref 0.0–1.2)
CO2: 24 mmol/L (ref 20–29)
Calcium: 9.5 mg/dL (ref 8.7–10.3)
Chloride: 101 mmol/L (ref 96–106)
Creatinine, Ser: 0.91 mg/dL (ref 0.57–1.00)
GFR calc Af Amer: 74 mL/min/{1.73_m2} (ref >59)
GFR calc non Af Amer: 64 mL/min/{1.73_m2} (ref >59)
Globulin, Total: 2.7 g/dL (ref 1.5–4.5)
Glucose: 141 mg/dL — ABNORMAL HIGH (ref 65–99)
Potassium: 5 mmol/L (ref 3.5–5.2)
Sodium: 138 mmol/L (ref 134–144)
Total Protein: 7 g/dL (ref 6.0–8.5)

## 2019-11-06 LAB — LIPID PANEL
Chol/HDL Ratio: 2.6 ratio (ref 0.0–4.4)
Cholesterol, Total: 179 mg/dL (ref 100–199)
HDL: 69 mg/dL (ref >39)
LDL Chol Calc (NIH): 94 mg/dL (ref 0–99)
Triglycerides: 85 mg/dL (ref 0–149)
VLDL Cholesterol Cal: 16 mg/dL (ref 5–40)

## 2019-11-06 LAB — MICROALBUMIN, URINE: Microalbumin, Urine: 7.1 ug/mL

## 2020-01-07 ENCOUNTER — Other Ambulatory Visit: Payer: Self-pay | Admitting: Family Medicine

## 2020-01-08 ENCOUNTER — Telehealth: Payer: Self-pay | Admitting: Family Medicine

## 2020-01-08 DIAGNOSIS — E785 Hyperlipidemia, unspecified: Secondary | ICD-10-CM

## 2020-01-08 MED ORDER — LOSARTAN POTASSIUM 100 MG PO TABS: 100.0000 mg | ORAL_TABLET | Freq: Every day | ORAL | 1 refills | Status: DC

## 2020-01-08 MED ORDER — LOVASTATIN 20 MG PO TABS: 20.0000 mg | ORAL_TABLET | Freq: Every day | ORAL | 1 refills | Status: DC

## 2020-01-08 NOTE — Telephone Encounter (Signed)
[place] requesting refill on Losartan 100 mg take one tablet po once daily. Pt last seen 11/01/19 for DM. Please advise. Thank you

## 2020-01-20 DIAGNOSIS — Z23 Encounter for immunization: Secondary | ICD-10-CM | POA: Diagnosis not present

## 2020-02-11 DIAGNOSIS — E119 Type 2 diabetes mellitus without complications: Secondary | ICD-10-CM | POA: Diagnosis not present

## 2020-04-15 ENCOUNTER — Other Ambulatory Visit: Payer: Self-pay | Admitting: Family Medicine

## 2020-07-27 ENCOUNTER — Ambulatory Visit (INDEPENDENT_AMBULATORY_CARE_PROVIDER_SITE_OTHER): Payer: Medicare Other | Admitting: Family Medicine

## 2020-07-27 ENCOUNTER — Other Ambulatory Visit: Payer: Self-pay

## 2020-07-27 ENCOUNTER — Encounter: Payer: Self-pay | Admitting: Family Medicine

## 2020-07-27 VITALS — BP 143/84 | HR 67 | Temp 97.3°F | Ht 62.0 in | Wt 150.2 lb

## 2020-07-27 DIAGNOSIS — E785 Hyperlipidemia, unspecified: Secondary | ICD-10-CM

## 2020-07-27 DIAGNOSIS — E119 Type 2 diabetes mellitus without complications: Secondary | ICD-10-CM

## 2020-07-27 DIAGNOSIS — I10 Essential (primary) hypertension: Secondary | ICD-10-CM

## 2020-07-27 MED ORDER — LOVASTATIN 20 MG PO TABS: 20.0000 mg | ORAL_TABLET | Freq: Every day | ORAL | 0 refills | Status: DC

## 2020-07-27 MED ORDER — CARVEDILOL 25 MG PO TABS: 25.0000 mg | ORAL_TABLET | Freq: Two times a day (BID) | ORAL | 0 refills | Status: DC

## 2020-07-27 MED ORDER — LOSARTAN POTASSIUM 100 MG PO TABS: 100.0000 mg | ORAL_TABLET | Freq: Every day | ORAL | 0 refills | Status: DC

## 2020-07-27 MED ORDER — SPIRONOLACTONE 50 MG PO TABS: 50.0000 mg | ORAL_TABLET | Freq: Every day | ORAL | 0 refills | Status: DC

## 2020-07-27 MED ORDER — GLIPIZIDE 5 MG PO TABS: ORAL_TABLET | ORAL | 0 refills | Status: DC

## 2020-07-27 NOTE — Progress Notes (Signed)
Patient ID: Bianca Arroyo, female    DOB: 01-19-1951, 70 y.o.   MRN: [id number]   Chief Complaint  Patient presents with  . Diabetes   Subjective:    HPI  F/u dm2- Pt here for 6 month follow up. Pt states sugars have been up and down. Checks sugars once per day. Blood pressure has also been fluctuating. Pt states no issues and takes meds as directed. Pt sees eye doctor but no foot doctor recently.   This week 125, does see some up to 150. Last a1c 7.5 no inc in thirst or urination.  2-3 yrs ago cardiology and was seen for bp regulation.  Optometry appt in 2021.  Update the chart.   Pt was seeing Dr. Domenic Polite for some of bp meds.  Looks like has ran out of some or hasn't had refills in past 12mo  Dm2- Compliant with medications. Checking blood glucose occasionally. Not seeing any high or low numbers.  Denies polyuria or polydipsia.  Eye exam: up to date in 2021. Foot exam: no foot concerns.  HTN Pt compliant with BP meds.  No SEs Denies chest pain, sob, LE swelling, or blurry vision.   HLD- doing well no new concerns.  Compliant with meds. No chest pain, palpitations, myalgias or joint pains.   Medical History DTabathahas a past medical history of H/O echocardiogram, Hyperlipidemia, Hypertension, and Type 2 diabetes mellitus ([place].   Outpatient Encounter Medications as of 07/27/2020  Medication Sig  . albuterol (PROVENTIL HFA;VENTOLIN HFA) 108 (90 Base) MCG/ACT inhaler Inhale 2 puffs into the lungs every 6 (six) hours as needed for wheezing or shortness of breath.  .Marland KitchenamLODipine (NORVASC) 5 MG tablet Take 1 tablet by mouth once daily  . aspirin 81 MG chewable tablet Chew 81 mg by mouth every morning.   . blood glucose meter kit and supplies Dispense based on patient and insurance preference. Use to test glucose once daily.  (FOR ICD -10 E11.9)  . fluticasone (FLONASE) 50 MCG/ACT nasal spray USE TWO SPRAY(S) IN EACH NOSTRIL ONCE DAILY  . fluticasone  (FLOVENT HFA) 44 MCG/ACT inhaler Two puffs twice daily  . Multiple Vitamins-Minerals (ALIVE ONCE DAILY WOMENS 50+ PO) Take 1 tablet by mouth every morning.   . Tetrahydrozoline HCl (VISINE OP) Place 1 drop into both eyes daily as needed (dry eye relief).   . valACYclovir (VALTREX) 500 MG tablet TAKE 1 TABLET BY MOUTH TWICE DAILY AS NEEDED FOR 3 DAYS FOR FLARES  . vitamin B-12 (CYANOCOBALAMIN) 1000 MCG tablet Take 1,000 mcg by mouth once a week.   . [DISCONTINUED] carvedilol (COREG) 25 MG tablet Take 1 tablet (25 mg total) by mouth 2 (two) times daily with a meal.  . [DISCONTINUED] glipiZIDE (GLUCOTROL) 5 MG tablet TAKE 1 TABLET BY MOUTH TWICE DAILY BEFORE MEAL(S)  . [DISCONTINUED] losartan (COZAAR) 100 MG tablet Take 1 tablet (100 mg total) by mouth daily.  . [DISCONTINUED] lovastatin (MEVACOR) 20 MG tablet Take 1 tablet (20 mg total) by mouth daily.  . [DISCONTINUED] spironolactone (ALDACTONE) 50 MG tablet Take 1 tablet by mouth once daily  . carvedilol (COREG) 25 MG tablet Take 1 tablet (25 mg total) by mouth 2 (two) times daily with a meal.  . glipiZIDE (GLUCOTROL) 5 MG tablet TAKE 1 TABLET BY MOUTH TWICE DAILY BEFORE MEAL(S)  . losartan (COZAAR) 100 MG tablet Take 1 tablet (100 mg total) by mouth daily.  .Marland Kitchenlovastatin (MEVACOR) 20 MG tablet Take 1 tablet (20 mg  total) by mouth daily.  Marland Kitchen spironolactone (ALDACTONE) 50 MG tablet Take 1 tablet (50 mg total) by mouth daily.  . [DISCONTINUED] nystatin cream (MYCOSTATIN) Apply QID to corners of mouth (Patient taking differently: Apply 1 application topically daily as needed for dry skin. )  . [DISCONTINUED] polyethylene glycol-electrolytes (TRILYTE) 420 g solution Take 4,000 mLs by mouth as directed.   Facility-Administered Encounter Medications as of 07/27/2020  Medication  . nitroGLYCERIN (NITRODUR - Dosed in mg/24 hr) patch 0.3 mg     Review of Systems  Constitutional: Negative for chills and fever.  HENT: Negative for congestion, rhinorrhea  and sore throat.   Respiratory: Negative for cough, shortness of breath and wheezing.   Cardiovascular: Negative for chest pain and leg swelling.  Gastrointestinal: Negative for abdominal pain, diarrhea, nausea and vomiting.  Genitourinary: Negative for dysuria and frequency.  Musculoskeletal: Negative for arthralgias and back pain.  Skin: Negative for rash.  Neurological: Negative for dizziness, weakness and headaches.     Vitals BP (!) 143/84   Pulse 67   Temp (!) 97.3 F (36.3 C)   Ht 5' 2"  (1.575 m)   Wt 150 lb 3.2 oz (68.1 kg)   SpO2 99%   BMI 27.47 kg/m   Objective:   Physical Exam Vitals and nursing note reviewed.  Constitutional:      Appearance: Normal appearance.  HENT:     Head: Normocephalic and atraumatic.     Nose: Nose normal.     Mouth/Throat:     Mouth: Mucous membranes are moist.     Pharynx: Oropharynx is clear.  Eyes:     Extraocular Movements: Extraocular movements intact.     Conjunctiva/sclera: Conjunctivae normal.     Pupils: Pupils are equal, round, and reactive to light.  Cardiovascular:     Rate and Rhythm: Normal rate and regular rhythm.     Pulses: Normal pulses.     Heart sounds: Normal heart sounds.  Pulmonary:     Effort: Pulmonary effort is normal.     Breath sounds: Normal breath sounds. No wheezing, rhonchi or rales.  Musculoskeletal:        General: Normal range of motion.     Right lower leg: No edema.     Left lower leg: No edema.  Skin:    General: Skin is warm and dry.     Findings: No lesion or rash.  Neurological:     General: No focal deficit present.     Mental Status: She is alert and oriented to person, place, and time.  Psychiatric:        Mood and Affect: Mood normal.        Behavior: Behavior normal.      Assessment and Plan   1. Essential hypertension, benign - CBC - CMP14+EGFR  2. Type 2 diabetes mellitus without complication, without long-term current use of insulin (HCC) - CMP14+EGFR -  Hemoglobin A1c - Microalbumin, urine  3. Diabetes mellitus without complication (HCC) - glipiZIDE (GLUCOTROL) 5 MG tablet; TAKE 1 TABLET BY MOUTH TWICE DAILY BEFORE MEAL(S)  Dispense: 60 tablet; Refill: 0  4. Hyperlipidemia LDL goal <100 - Lipid panel - lovastatin (MEVACOR) 20 MG tablet; Take 1 tablet (20 mg total) by mouth daily.  Dispense: 30 tablet; Refill: 0   htn- suboptimal. Cont meds.  Dec salt intake and increase in exercising.  Recheck next visit.  dm2- stable. Cont meds.  a1c at 7.6, last time 7.5.  Cont to watch carbs in diet.  hld- stable. Cont meds.  Will give refills after we get labs.  Pt to get labs today.  Return in about 6 months (around 01/26/2021) for f/u htn, dm2.

## 2020-07-28 LAB — CMP14+EGFR
ALT: 25 IU/L (ref 0–32)
AST: 27 IU/L (ref 0–40)
Albumin/Globulin Ratio: 1.6 (ref 1.2–2.2)
Albumin: 4.9 g/dL — ABNORMAL HIGH (ref 3.7–4.7)
Alkaline Phosphatase: 89 IU/L (ref 44–121)
BUN/Creatinine Ratio: 24 (ref 12–28)
BUN: 29 mg/dL — ABNORMAL HIGH (ref 8–27)
Bilirubin Total: 0.3 mg/dL (ref 0.0–1.2)
CO2: 21 mmol/L (ref 20–29)
Calcium: 10.4 mg/dL — ABNORMAL HIGH (ref 8.7–10.3)
Chloride: 97 mmol/L (ref 96–106)
Creatinine, Ser: 1.19 mg/dL — ABNORMAL HIGH (ref 0.57–1.00)
Globulin, Total: 3.1 g/dL (ref 1.5–4.5)
Glucose: 141 mg/dL — ABNORMAL HIGH (ref 65–99)
Potassium: 4.8 mmol/L (ref 3.5–5.2)
Sodium: 138 mmol/L (ref 134–144)
Total Protein: 8 g/dL (ref 6.0–8.5)
eGFR: 49 mL/min/{1.73_m2} — ABNORMAL LOW (ref >59)

## 2020-07-28 LAB — CBC
Hematocrit: 41.1 % (ref 34.0–46.6)
Hemoglobin: 13.6 g/dL (ref 11.1–15.9)
MCH: 30 pg (ref 26.6–33.0)
MCHC: 33.1 g/dL (ref 31.5–35.7)
MCV: 91 fL (ref 79–97)
Platelets: 270 10*3/uL (ref 150–450)
RBC: 4.53 x10E6/uL (ref 3.77–5.28)
RDW: 13 % (ref 11.7–15.4)
WBC: 7.5 10*3/uL (ref 3.4–10.8)

## 2020-07-28 LAB — LIPID PANEL
Chol/HDL Ratio: 2.4 ratio (ref 0.0–4.4)
Cholesterol, Total: 198 mg/dL (ref 100–199)
HDL: 83 mg/dL (ref >39)
LDL Chol Calc (NIH): 101 mg/dL — ABNORMAL HIGH (ref 0–99)
Triglycerides: 81 mg/dL (ref 0–149)
VLDL Cholesterol Cal: 14 mg/dL (ref 5–40)

## 2020-07-28 LAB — HEMOGLOBIN A1C
Est. average glucose Bld gHb Est-mCnc: 171 mg/dL
Hgb A1c MFr Bld: 7.6 % — ABNORMAL HIGH (ref 4.8–5.6)

## 2020-07-28 LAB — MICROALBUMIN, URINE: Microalbumin, Urine: 9.7 ug/mL

## 2020-07-29 ENCOUNTER — Other Ambulatory Visit: Payer: Self-pay | Admitting: Family Medicine

## 2020-08-06 NOTE — Progress Notes (Signed)
Cardiology Office Note  Date: 08/07/2020   ID: Bianca, Arroyo 1950/05/26, MRN [id number]  PCP:  Erven Colla, DO  Cardiologist:  Rozann Lesches, MD Electrophysiologist:  None   Chief Complaint: Follow up  History of Present Illness: Bianca Arroyo is a 70 y.o. female with a history of HTN, HLD, DM2,  Last seen Dr Domenic Polite 07/21/2017. She was continuing on Losartan and Toprol XL with an increase in dose of Aldactone to 50 mg po daily. BP on that visit was 188/120. Home SBP ranging in the 190-200 range and DBP 100-110. No complaints of chest pain or shortness of breath. Coreg was increased to 75 mg po bid. She was continuing Cozaar and Aldactone at current doses. She was continuing to follow with Dr Wolfgang Phoenix for DM2. She was continuing Mevacor with LDL of 66.  She is here for follow-up today.  She denies any recent acute illnesses or hospitalizations.  Denies any recent issues with blood pressure.  States that sometimes BP fluctuates.  Blood pressure today is 118/68.  EKG today shows normal sinus rhythm with a rate of 62.  Septal infarct, age undetermined, ST and T wave abnormality consider inferior and anterolateral ischemia.  This is unchanged from previous EKG.  She denies any anginal or exertional symptoms, palpitations or arrhythmias, lightheadedness, dizziness, presyncopal or syncopal episodes.  Denies any CVA or TIA-like symptoms, PND, orthopnea, bleeding, claudication-like symptoms, DVT or PE-like symptoms, or lower extremity edema.  States she is compliant with all of her medications.  She continues on a cardiac regimen of amlodipine 5 mg daily, aspirin 81 mg daily, carvedilol 25 mg p.o. twice daily, losartan 100 mg daily, spironolactone 50 mg daily, lovastatin 20 mg daily.  Past Medical History:  Diagnosis Date  . H/O echocardiogram    a. 05/2017: echo showing EF of 65-70%, no regional WMA, moderate LVH, and mild MR.   Marland Kitchen Hyperlipidemia   . Hypertension   . Type 2  diabetes mellitus ([place])     Past Surgical History:  Procedure Laterality Date  . BREAST BIOPSY Left    benign  . CESAREAN SECTION    . COLONOSCOPY    . COLONOSCOPY N/A 12/06/2013   Dr. Oneida Alar: Left colon redundant, diverticulosis, 2 polyps removed, one tubular adenoma the other hyperplastic.  Consider colonoscopy in 5 to 10 years with overtube  . COLONOSCOPY WITH PROPOFOL N/A 06/11/2019   Procedure: COLONOSCOPY WITH PROPOFOL;  Surgeon: Danie Binder, MD;  [facility];  Service: Endoscopy;  Laterality: N/A;  10:30am  . Left wrist surgery    . POLYPECTOMY  06/11/2019   Procedure: POLYPECTOMY;  Surgeon: Danie Binder, MD;  [facility];  Service: Endoscopy;;  . Right trigger thumb  2013  . tummy tuck      Current Outpatient Medications  Medication Sig Dispense Refill  . albuterol (PROVENTIL HFA;VENTOLIN HFA) 108 (90 Base) MCG/ACT inhaler Inhale 2 puffs into the lungs every 6 (six) hours as needed for wheezing or shortness of breath. 1 Inhaler 0  . amLODipine (NORVASC) 5 MG tablet Take 1 tablet by mouth once daily 90 tablet 0  . aspirin 81 MG chewable tablet Chew 81 mg by mouth every morning.     . blood glucose meter kit and supplies Dispense based on patient and insurance preference. Use to test glucose once daily.  (FOR ICD -10 E11.9) 1 each 5  . carvedilol (COREG) 25 MG tablet Take 1 tablet (25 mg total)  by mouth 2 (two) times daily with a meal. 60 tablet 0  . fluticasone (FLONASE) 50 MCG/ACT nasal spray USE TWO SPRAY(S) IN EACH NOSTRIL ONCE DAILY 16 g 1  . fluticasone (FLOVENT HFA) 44 MCG/ACT inhaler Two puffs twice daily 1 Inhaler 5  . glipiZIDE (GLUCOTROL) 5 MG tablet TAKE 1 TABLET BY MOUTH TWICE DAILY BEFORE MEAL(S) 60 tablet 0  . losartan (COZAAR) 100 MG tablet Take 1 tablet (100 mg total) by mouth daily. 30 tablet 0  . lovastatin (MEVACOR) 20 MG tablet Take 1 tablet (20 mg total) by mouth daily. 30 tablet 0  . Multiple Vitamins-Minerals (ALIVE ONCE DAILY  WOMENS 50+ PO) Take 1 tablet by mouth every morning.     Marland Kitchen spironolactone (ALDACTONE) 50 MG tablet Take 1 tablet (50 mg total) by mouth daily. 30 tablet 0  . Tetrahydrozoline HCl (VISINE OP) Place 1 drop into both eyes daily as needed (dry eye relief).     . valACYclovir (VALTREX) 500 MG tablet TAKE 1 TABLET BY MOUTH TWICE DAILY AS NEEDED FOR 3 DAYS FOR FLARES 6 tablet 0  . vitamin B-12 (CYANOCOBALAMIN) 1000 MCG tablet Take 1,000 mcg by mouth once a week.      Current Facility-Administered Medications  Medication Dose Route Frequency Provider Last Rate Last Admin  . nitroGLYCERIN (NITRODUR - Dosed in mg/24 hr) patch 0.3 mg  0.3 mg Transdermal Daily Regal, Tamala Fothergill, DPM       Allergies:  Colcrys [colchicine]   Social History: The patient  reports that she has never smoked. She has never used smokeless tobacco. She reports current alcohol use of about 3.0 standard drinks of alcohol per week. She reports that she does not use drugs.   Family History: The patient's family history includes Diabetes in her father, mother, and sister; High blood pressure in her mother and sister; Hypertension in her father.   ROS:  Please see the history of present illness. Otherwise, complete review of systems is positive for none.  All other systems are reviewed and negative.   Physical Exam: VS:  BP 118/68   Pulse 62   Ht 5' 2"  (1.575 m)   Wt 149 lb (67.6 kg)   SpO2 96%   BMI 27.25 kg/m , BMI Body mass index is 27.25 kg/m.  Wt Readings from Last 3 Encounters:  08/07/20 149 lb (67.6 kg)  07/27/20 150 lb 3.2 oz (68.1 kg)  11/01/19 151 lb (68.5 kg)    General: Patient appears comfortable at rest. Neck: Supple, no elevated JVP or carotid bruits, no thyromegaly. Lungs: Clear to auscultation, nonlabored breathing at rest. Cardiac: Regular rate and rhythm, no S3 or significant systolic murmur, no pericardial rub. Extremities: No pitting edema, distal pulses 2+. Skin: Warm and dry. Musculoskeletal: No  kyphosis. Neuropsychiatric: Alert and oriented x3, affect grossly appropriate.  ECG:  An ECG dated 08/07/2020 was personally reviewed today and demonstrated:  Normal sinus rhythm rate of 62, septal infarct age undetermined, ST and T wave abnormality consider inferior ischemia consider anterolateral ischemia.  Recent Labwork: 07/27/2020: ALT 25; AST 27; BUN 29; Creatinine, Ser 1.19; Hemoglobin 13.6; Platelets 270; Potassium 4.8; Sodium 138     Component Value Date/Time   CHOL 198 07/27/2020 1517   TRIG 81 07/27/2020 1517   HDL 83 07/27/2020 1517   CHOLHDL 2.4 07/27/2020 1517   CHOLHDL 2.5 06/24/2014 1058   VLDL 13 06/24/2014 1058   LDLCALC 101 (H) 07/27/2020 1517    Other Studies Reviewed Today:  Echocardiogram 05/30/2017:  Study Conclusions  - Left ventricle: The cavity size was normal. Wall thickness was increased in a pattern of moderate LVH. Systolic function was vigorous. The estimated ejection fraction was in the range of 65% to 70%. Diastolic function is abnormal, indeterminant grade. Wall motion was normal; there were no regional wall motion abnormalities. - Aortic valve: Valve area (VTI): 2.3 cm^2. Valve area (Vmax): 2.44 cm^2. - Mitral valve: There was mild regurgitation. - Technically adequate study.  Assessment and Plan:  1. Essential hypertension, benign   2. Hyperlipidemia LDL goal <100   3. Type 2 diabetes mellitus without complication, without long-term current use of insulin ([place])    1. Essential hypertension, benign Blood pressure well controlled today.  Continue amlodipine 5 mg daily.  Carvedilol 25 mg p.o. twice daily.  Losartan 100 mg daily.  Spironolactone 50 mg daily.  Continue to monitor.  2. Hyperlipidemia LDL goal <100 Patient states she recently had lab work that primary care provider.  Recent lipid panel on 07/27/2020: TC 198, HDL 83, TG 81, LDL 101.  Continue lovastatin 20 mg daily.  Continue aspirin 81 mg daily.  Patient states PCP is  continuing to monitor  3. Type 2 diabetes mellitus without complication, without long-term current use of insulin ([facility]) Patient states her glucose has been elevated recently.  Recent random glucose on 07/27/2020 was 141.  Hemoglobin A1c was 7.6%.  Currently on glipizide 5 mg daily.  Patient states PCP is continuing to monitor.   Medication Adjustments/Labs and Tests Ordered: Current medicines are reviewed at length with the patient today.  Concerns regarding medicines are outlined above.   Disposition: Follow-up with Dr. Domenic Polite or APP 1 year  Signed, Levell July, NP 08/07/2020 4:03 PM    [facility] at [address], [place], [place] [address] Phone: [phone]; Fax: [phone]

## 2020-08-07 ENCOUNTER — Ambulatory Visit (INDEPENDENT_AMBULATORY_CARE_PROVIDER_SITE_OTHER): Payer: Medicare Other | Admitting: Family Medicine

## 2020-08-07 ENCOUNTER — Encounter: Payer: Self-pay | Admitting: Family Medicine

## 2020-08-07 VITALS — BP 118/68 | HR 62 | Ht 62.0 in | Wt 149.0 lb

## 2020-08-07 DIAGNOSIS — E119 Type 2 diabetes mellitus without complications: Secondary | ICD-10-CM

## 2020-08-07 DIAGNOSIS — E785 Hyperlipidemia, unspecified: Secondary | ICD-10-CM

## 2020-08-07 DIAGNOSIS — I10 Essential (primary) hypertension: Secondary | ICD-10-CM | POA: Diagnosis not present

## 2020-08-07 NOTE — Patient Instructions (Signed)

## 2020-08-17 ENCOUNTER — Other Ambulatory Visit: Payer: Self-pay | Admitting: Family Medicine

## 2020-08-26 DIAGNOSIS — I10 Essential (primary) hypertension: Secondary | ICD-10-CM | POA: Insufficient documentation

## 2020-08-26 DIAGNOSIS — N950 Postmenopausal bleeding: Secondary | ICD-10-CM | POA: Diagnosis not present

## 2020-09-15 ENCOUNTER — Encounter: Payer: Medicare Other | Admitting: Obstetrics & Gynecology

## 2020-09-22 ENCOUNTER — Other Ambulatory Visit: Payer: Self-pay | Admitting: Family Medicine

## 2020-09-22 NOTE — Telephone Encounter (Signed)
Lab Results  Component Value Date   HGBA1C 7.6 (H) 07/27/2020    Lab Results  Component Value Date   CREATININE 1.19 (H) 07/27/2020     Lab Results  Component Value Date   CHOL 198 07/27/2020   HDL 83 07/27/2020   LDLCALC 101 (H) 07/27/2020   TRIG 81 07/27/2020   CHOLHDL 2.4 07/27/2020     BP Readings from Last 3 Encounters:  08/07/20 118/68  07/27/20 (!) 143/84  11/01/19 104/68

## 2020-09-23 DIAGNOSIS — N950 Postmenopausal bleeding: Secondary | ICD-10-CM | POA: Diagnosis not present

## 2020-09-23 DIAGNOSIS — M8588 Other specified disorders of bone density and structure, other site: Secondary | ICD-10-CM | POA: Diagnosis not present

## 2020-09-25 ENCOUNTER — Telehealth: Payer: Self-pay | Admitting: Family Medicine

## 2020-09-25 ENCOUNTER — Telehealth: Payer: Self-pay

## 2020-09-25 NOTE — Telephone Encounter (Signed)
Left message for patient to call back and schedule Medicare Annual Wellness Visit (AWV).   Please offer to do virtually or by telephone.   Last AWV:10/30/2017  Please schedule at anytime with Nurse Health Advisor.

## 2020-09-30 NOTE — Telephone Encounter (Signed)
Error

## 2020-10-13 ENCOUNTER — Other Ambulatory Visit: Payer: Self-pay | Admitting: Family Medicine

## 2020-10-13 DIAGNOSIS — E119 Type 2 diabetes mellitus without complications: Secondary | ICD-10-CM

## 2020-10-27 ENCOUNTER — Other Ambulatory Visit: Payer: Self-pay | Admitting: Family Medicine

## 2020-11-02 DIAGNOSIS — N840 Polyp of corpus uteri: Secondary | ICD-10-CM | POA: Diagnosis not present

## 2020-11-02 DIAGNOSIS — N950 Postmenopausal bleeding: Secondary | ICD-10-CM | POA: Diagnosis not present

## 2020-11-02 DIAGNOSIS — N841 Polyp of cervix uteri: Secondary | ICD-10-CM | POA: Diagnosis not present

## 2020-11-10 ENCOUNTER — Ambulatory Visit (INDEPENDENT_AMBULATORY_CARE_PROVIDER_SITE_OTHER): Payer: Medicare Other | Admitting: Orthopaedic Surgery

## 2020-11-10 ENCOUNTER — Encounter: Payer: Self-pay | Admitting: Orthopaedic Surgery

## 2020-11-10 ENCOUNTER — Other Ambulatory Visit: Payer: Self-pay

## 2020-11-10 VITALS — BP 139/70 | HR 74 | Ht 62.0 in | Wt 149.6 lb

## 2020-11-10 DIAGNOSIS — M545 Low back pain, unspecified: Secondary | ICD-10-CM | POA: Diagnosis not present

## 2020-11-10 DIAGNOSIS — M79604 Pain in right leg: Secondary | ICD-10-CM

## 2020-11-10 MED ORDER — NAPROXEN 500 MG PO TABS: 500.0000 mg | ORAL_TABLET | Freq: Two times a day (BID) | ORAL | 5 refills | Status: AC

## 2020-11-10 NOTE — Progress Notes (Signed)
Subjective:    Patient ID: Bianca Arroyo, female    DOB: 1951-04-07, 70 y.o.   MRN: [id number]  HPI She participated in the Senior Games recently doing 67 M, 100M, 800  dashes and 5K walk and 1500M walk.  She did well and got Gold Medals but she did not warm up for the events.  The next day she felt sore in the lower back and hips.  It got worse.  She has had pain running down the right leg past the knee.  She has taken Tylenol, used pain patches and used heat and ice.  She is better but still has some nagging pain at times.  She has no spasm, no weakness, no numbness.    Review of Systems  Constitutional:  Positive for activity change.  Musculoskeletal:  Positive for arthralgias, back pain and myalgias.  All other systems reviewed and are negative. For Review of Systems, all other systems reviewed and are negative.  The following is a summary of the past history medically, past history surgically, known current medicines, social history and family history.  This information is gathered electronically by the computer from prior information and documentation.  I review this each visit and have found including this information at this point in the chart is beneficial and informative.   Past Medical History:  Diagnosis Date   H/O echocardiogram    a. 05/2017: echo showing EF of 65-70%, no regional WMA, moderate LVH, and mild MR.    Hyperlipidemia    Hypertension    Type 2 diabetes mellitus ([place])     Past Surgical History:  Procedure Laterality Date   BREAST BIOPSY Left    benign   CESAREAN SECTION     COLONOSCOPY     COLONOSCOPY N/A 12/06/2013   Dr. Oneida Alar: Left colon redundant, diverticulosis, 2 polyps removed, one tubular adenoma the other hyperplastic.  Consider colonoscopy in 5 to 10 years with overtube   COLONOSCOPY WITH PROPOFOL N/A 06/11/2019   Procedure: COLONOSCOPY WITH PROPOFOL;  Surgeon: Danie Binder, MD;  [facility];  Service: Endoscopy;  Laterality:  N/A;  10:30am   Left wrist surgery     POLYPECTOMY  06/11/2019   Procedure: POLYPECTOMY;  Surgeon: Danie Binder, MD;  [facility];  Service: Endoscopy;;   Right trigger thumb  2013   tummy tuck      Current Outpatient Medications on File Prior to Visit  Medication Sig Dispense Refill   albuterol (PROVENTIL HFA;VENTOLIN HFA) 108 (90 Base) MCG/ACT inhaler Inhale 2 puffs into the lungs every 6 (six) hours as needed for wheezing or shortness of breath. 1 Inhaler 0   amLODipine (NORVASC) 5 MG tablet Take 1 tablet by mouth once daily 90 tablet 0   aspirin 81 MG chewable tablet Chew 81 mg by mouth every morning.      blood glucose meter kit and supplies Dispense based on patient and insurance preference. Use to test glucose once daily.  (FOR ICD -10 E11.9) 1 each 5   carvedilol (COREG) 25 MG tablet TAKE 1 TABLET BY MOUTH TWICE DAILY WITH A MEAL 60 tablet 0   fluticasone (FLONASE) 50 MCG/ACT nasal spray USE TWO SPRAY(S) IN EACH NOSTRIL ONCE DAILY 16 g 1   fluticasone (FLOVENT HFA) 44 MCG/ACT inhaler Two puffs twice daily 1 Inhaler 5   glipiZIDE (GLUCOTROL) 5 MG tablet TAKE 1 TABLET BY MOUTH TWICE DAILY BEFORE MEAL(S) 60 tablet 0   losartan (COZAAR) 100 MG  tablet Take 1 tablet by mouth once daily 90 tablet 1   lovastatin (MEVACOR) 20 MG tablet Take 1 tablet (20 mg total) by mouth daily. 30 tablet 0   Multiple Vitamins-Minerals (ALIVE ONCE DAILY WOMENS 50+ PO) Take 1 tablet by mouth every morning.      spironolactone (ALDACTONE) 50 MG tablet Take 1 tablet by mouth once daily 90 tablet 1   Tetrahydrozoline HCl (VISINE OP) Place 1 drop into both eyes daily as needed (dry eye relief).      valACYclovir (VALTREX) 500 MG tablet TAKE 1 TABLET BY MOUTH TWICE DAILY AS NEEDED FOR 3 DAYS FOR FLARES 6 tablet 0   vitamin B-12 (CYANOCOBALAMIN) 1000 MCG tablet Take 1,000 mcg by mouth once a week.      Current Facility-Administered Medications on File Prior to Visit  Medication Dose Route Frequency  Provider Last Rate Last Admin   nitroGLYCERIN (NITRODUR - Dosed in mg/24 hr) patch 0.3 mg  0.3 mg Transdermal Daily Regal, Tamala Fothergill, DPM        Social History   Socioeconomic History   Marital status: Widowed    Spouse name: Not on file   Number of children: Not on file   Years of education: Not on file   Highest education level: Not on file  Occupational History   Not on file  Tobacco Use   Smoking status: Never   Smokeless tobacco: Never  Vaping Use   Vaping Use: Never used  Substance and Sexual Activity   Alcohol use: Yes    Alcohol/week: 3.0 standard drinks    Types: 3 Standard drinks or equivalent per week    Comment: 3-4 times per week   Drug use: No   Sexual activity: Not on file  Other Topics Concern   Not on file  Social History Narrative   Not on file   Social Determinants of Health   Financial Resource Strain: Not on file  Food Insecurity: Not on file  Transportation Needs: Not on file  Physical Activity: Not on file  Stress: Not on file  Social Connections: Not on file  Intimate Partner Violence: Not on file    Family History  Problem Relation Age of Onset   Diabetes Father    Hypertension Father    Diabetes Mother    High blood pressure Mother    High blood pressure Sister    Diabetes Sister    Colon cancer Neg Hx     BP 139/70   Pulse 74   Ht 5' 2"  (1.575 m)   Wt 149 lb 9.6 oz (67.9 kg)   BMI 27.36 kg/m   Body mass index is 27.36 kg/m.     Objective:   Physical Exam Vitals and nursing note reviewed. Exam conducted with a chaperone present.  Constitutional:      Appearance: She is well-developed.  HENT:     Head: Normocephalic and atraumatic.  Eyes:     Conjunctiva/sclera: Conjunctivae normal.     Pupils: Pupils are equal, round, and reactive to light.  Cardiovascular:     Rate and Rhythm: Normal rate and regular rhythm.  Pulmonary:     Effort: Pulmonary effort is normal.  Abdominal:     Palpations: Abdomen is soft.   Musculoskeletal:     Cervical back: Normal range of motion and neck supple.       Back:  Skin:    General: Skin is warm and dry.  Neurological:     Mental Status: She  is alert and oriented to person, place, and time.     Cranial Nerves: No cranial nerve deficit.     Motor: No abnormal muscle tone.     Coordination: Coordination normal.     Deep Tendon Reflexes: Reflexes are normal and symmetric. Reflexes normal.  Psychiatric:        Behavior: Behavior normal.        Thought Content: Thought content normal.        Judgment: Judgment normal.          Assessment & Plan:   Encounter Diagnosis  Name Primary?   Lumbar pain with radiation down right leg Yes   I will not get X-rays today.  I have given sheet of exercises to do.  I have encouraged walking.  I have given Rx for Naprosyn 500 po bid pc  Return in three weeks.  If get worse call.  Call if any problem.  Precautions discussed.  Electronically Signed Sanjuana Kava, MD 7/26/20221:45 PM

## 2020-11-15 ENCOUNTER — Other Ambulatory Visit: Payer: Self-pay | Admitting: Family Medicine

## 2020-11-15 DIAGNOSIS — E119 Type 2 diabetes mellitus without complications: Secondary | ICD-10-CM

## 2020-11-20 ENCOUNTER — Other Ambulatory Visit: Payer: Self-pay | Admitting: Family Medicine

## 2020-11-23 ENCOUNTER — Ambulatory Visit: Payer: Self-pay | Admitting: *Deleted

## 2020-11-23 NOTE — Telephone Encounter (Signed)
Pt called in on the community line c/ being constipated for the last 2 days.   She had a BM this morning.    She feels her bloated stomach is pressing up on her lungs.    I suggested she call her PCP which she was agreeable to doing.Reason for Disposition  Unable to have a bowel movement (BM) without laxative or enema    Going to call her PCP  Answer Assessment - Initial Assessment Questions 1. STOOL PATTERN OR FREQUENCY: "How often do you have a bowel movement (BM)?"  (Normal range: 3 times a day to every 3 days)  "When was your last BM?"       I finally had a BM this morning.   It was a couple days since I had one.   I feel like my stomach is swollen.   I feel like it's swelling against my lungs.   2. STRAINING: "Do you have to strain to have a BM?"      Yes I have asthma too.    I'm using my inhaler.   I think my stomach being so bloated is pressing up against my lungs.    I'm taking Tums which doesn't seem to be helping.   My nose has been running for 2 months.  I have allergies.  My Covid test is negative.    3. RECTAL PAIN: "Does your rectum hurt when the stool comes out?" If Yes, ask: "Do you have hemorrhoids? How bad is the pain?"  (Scale 1-10; or mild, moderate, severe)     No pain 4. STOOL COMPOSITION: "Are the stools hard?"      A little hard 5. BLOOD ON STOOLS: "Has there been any blood on the toilet tissue or on the surface of the BM?" If Yes, ask: "When was the last time?"      No 6. CHRONIC CONSTIPATION: "Is this a new problem for you?"  If no, ask: "How long have you had this problem?" (days, weeks, months)      Sometimes it's a problem 7. CHANGES IN DIET OR HYDRATION: "Have there been any recent changes in your diet?" "How much fluids are you drinking on a daily basis?"  "How much have you had to drink today?"     Not asked 8. MEDICATIONS: "Have you been taking any new medications?" "Are you taking any narcotic pain medications?" (e.g., Vicodin, Percocet, morphine, Dilaudid)      Not asked.     Triage ended at this point.   She is going to call her PCP. 9. LAXATIVES: "Have you been using any stool softeners, laxatives, or enemas?"  If yes, ask "What, how often, and when was the last time?"     *No Answer* 10. ACTIVITY:  "How much walking do you do every day?"  "Has your activity level decreased in the past week?"        *No Answer* 11. CAUSE: "What do you think is causing the constipation?"        *No Answer* 12. OTHER SYMPTOMS: "Do you have any other symptoms?" (e.g., abdominal pain, bloating, fever, vomiting)       *No Answer* 13. MEDICAL HISTORY: "Do you have a history of hemorrhoids, rectal fissures, or rectal surgery or rectal abscess?"         *No Answer* 14. PREGNANCY: "Is there any chance you are pregnant?" "When was your last menstrual period?"       *No Answer*  Protocols used: Constipation-A-AH

## 2020-12-01 ENCOUNTER — Encounter: Payer: Self-pay | Admitting: Orthopaedic Surgery

## 2020-12-01 ENCOUNTER — Other Ambulatory Visit: Payer: Self-pay

## 2020-12-01 ENCOUNTER — Ambulatory Visit (INDEPENDENT_AMBULATORY_CARE_PROVIDER_SITE_OTHER): Payer: Medicare Other | Admitting: Orthopaedic Surgery

## 2020-12-01 VITALS — BP 129/77 | HR 65 | Ht 62.0 in | Wt 144.4 lb

## 2020-12-01 DIAGNOSIS — M545 Low back pain, unspecified: Secondary | ICD-10-CM | POA: Diagnosis not present

## 2020-12-01 DIAGNOSIS — M79604 Pain in right leg: Secondary | ICD-10-CM | POA: Diagnosis not present

## 2020-12-01 NOTE — Progress Notes (Signed)
I am better.  She still has a little tingling down the right leg from the hip but overall is much improved. She is walking with no pain.  She is active.  Spine/Pelvis examination:  Inspection:  Overall, sacoiliac joint benign and hips nontender; without crepitus or defects.   Thoracic spine inspection: Alignment normal without kyphosis present   Lumbar spine inspection:  Alignment  with normal lumbar lordosis, without scoliosis apparent.   Thoracic spine palpation:  without tenderness of spinal processes   Lumbar spine palpation: without tenderness of lumbar area; without tightness of lumbar muscles    Range of Motion:   Lumbar flexion, forward flexion is normal without pain or tenderness    Lumbar extension is full without pain or tenderness   Left lateral bend is normal without pain or tenderness   Right lateral bend is normal without pain or tenderness   Straight leg raising is normal  Strength & tone: normal   Stability overall normal stability  Encounter Diagnosis  Name Primary?   Lumbar pain with radiation down right leg Yes   I will see as needed.  Call if any problem.  Precautions discussed.  Electronically Signed Sanjuana Kava, MD 8/16/20222:41 PM

## 2020-12-09 DIAGNOSIS — R5383 Other fatigue: Secondary | ICD-10-CM | POA: Diagnosis not present

## 2020-12-09 DIAGNOSIS — M5430 Sciatica, unspecified side: Secondary | ICD-10-CM | POA: Diagnosis not present

## 2020-12-09 DIAGNOSIS — Z6826 Body mass index (BMI) 26.0-26.9, adult: Secondary | ICD-10-CM | POA: Diagnosis not present

## 2020-12-09 DIAGNOSIS — J309 Allergic rhinitis, unspecified: Secondary | ICD-10-CM | POA: Diagnosis not present

## 2020-12-09 DIAGNOSIS — R030 Elevated blood-pressure reading, without diagnosis of hypertension: Secondary | ICD-10-CM | POA: Diagnosis not present

## 2020-12-09 DIAGNOSIS — Z299 Encounter for prophylactic measures, unspecified: Secondary | ICD-10-CM | POA: Diagnosis not present

## 2020-12-09 DIAGNOSIS — E119 Type 2 diabetes mellitus without complications: Secondary | ICD-10-CM | POA: Diagnosis not present

## 2020-12-17 ENCOUNTER — Ambulatory Visit: Payer: Medicare Other | Admitting: Nurse Practitioner

## 2020-12-18 ENCOUNTER — Other Ambulatory Visit: Payer: Self-pay | Admitting: Family Medicine

## 2020-12-18 DIAGNOSIS — E119 Type 2 diabetes mellitus without complications: Secondary | ICD-10-CM

## 2021-01-18 DIAGNOSIS — Z23 Encounter for immunization: Secondary | ICD-10-CM | POA: Diagnosis not present

## 2021-01-20 DIAGNOSIS — Z1331 Encounter for screening for depression: Secondary | ICD-10-CM | POA: Diagnosis not present

## 2021-01-20 DIAGNOSIS — Z6827 Body mass index (BMI) 27.0-27.9, adult: Secondary | ICD-10-CM | POA: Diagnosis not present

## 2021-01-20 DIAGNOSIS — Z0000 Encounter for general adult medical examination without abnormal findings: Secondary | ICD-10-CM | POA: Diagnosis not present

## 2021-01-20 DIAGNOSIS — Z7189 Other specified counseling: Secondary | ICD-10-CM | POA: Diagnosis not present

## 2021-01-20 DIAGNOSIS — R5383 Other fatigue: Secondary | ICD-10-CM | POA: Diagnosis not present

## 2021-01-20 DIAGNOSIS — I10 Essential (primary) hypertension: Secondary | ICD-10-CM | POA: Diagnosis not present

## 2021-01-20 DIAGNOSIS — E7800 Pure hypercholesterolemia, unspecified: Secondary | ICD-10-CM | POA: Diagnosis not present

## 2021-01-20 DIAGNOSIS — Z1339 Encounter for screening examination for other mental health and behavioral disorders: Secondary | ICD-10-CM | POA: Diagnosis not present

## 2021-01-20 DIAGNOSIS — Z299 Encounter for prophylactic measures, unspecified: Secondary | ICD-10-CM | POA: Diagnosis not present

## 2021-01-21 DIAGNOSIS — E538 Deficiency of other specified B group vitamins: Secondary | ICD-10-CM | POA: Diagnosis not present

## 2021-01-21 DIAGNOSIS — Z79899 Other long term (current) drug therapy: Secondary | ICD-10-CM | POA: Diagnosis not present

## 2021-01-21 DIAGNOSIS — R5383 Other fatigue: Secondary | ICD-10-CM | POA: Diagnosis not present

## 2021-01-21 DIAGNOSIS — E7800 Pure hypercholesterolemia, unspecified: Secondary | ICD-10-CM | POA: Diagnosis not present

## 2021-01-21 DIAGNOSIS — E559 Vitamin D deficiency, unspecified: Secondary | ICD-10-CM | POA: Diagnosis not present

## 2021-02-02 ENCOUNTER — Other Ambulatory Visit: Payer: Self-pay | Admitting: Family Medicine

## 2021-02-02 DIAGNOSIS — E119 Type 2 diabetes mellitus without complications: Secondary | ICD-10-CM

## 2021-02-10 ENCOUNTER — Other Ambulatory Visit: Payer: Self-pay | Admitting: Family Medicine

## 2021-02-10 DIAGNOSIS — E785 Hyperlipidemia, unspecified: Secondary | ICD-10-CM

## 2021-03-10 IMAGING — MG DIGITAL SCREENING BILAT W/ TOMO W/ CAD
8 series · 8 of 24 positions shown · non-contrast
Comparison: Previous exam(s).

CLINICAL DATA: Screening.

EXAM:
DIGITAL SCREENING BILATERAL MAMMOGRAM WITH TOMO AND CAD

[L CC synth-2D]
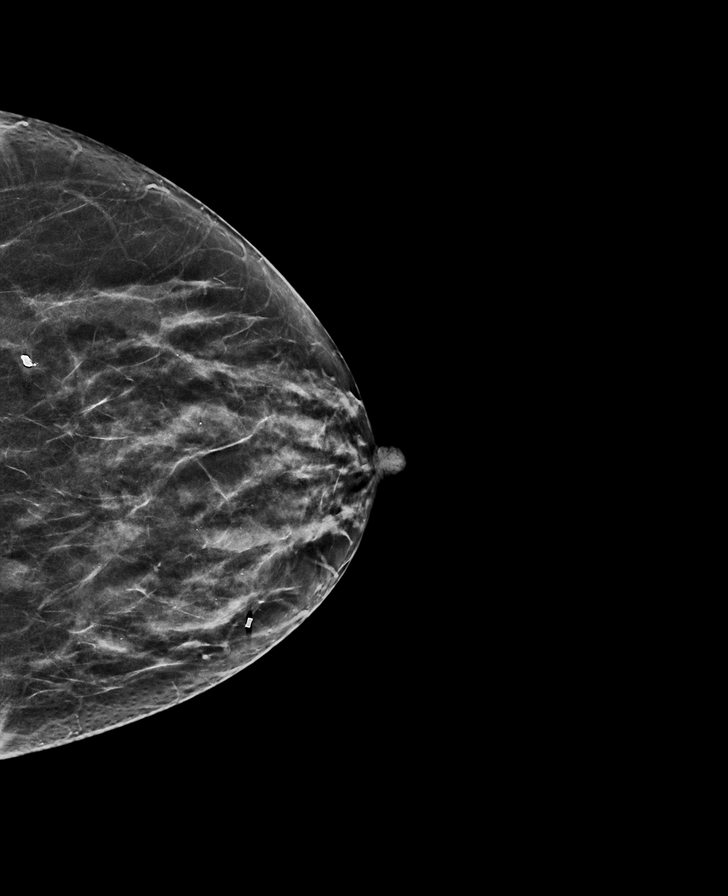

[R CC synth-2D]
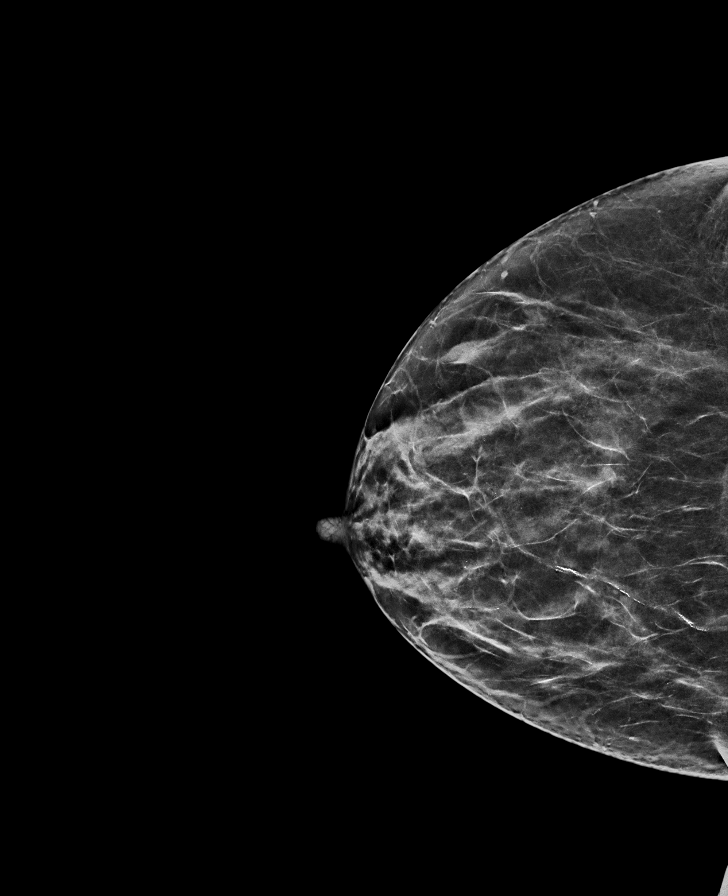

[L MLO synth-2D]
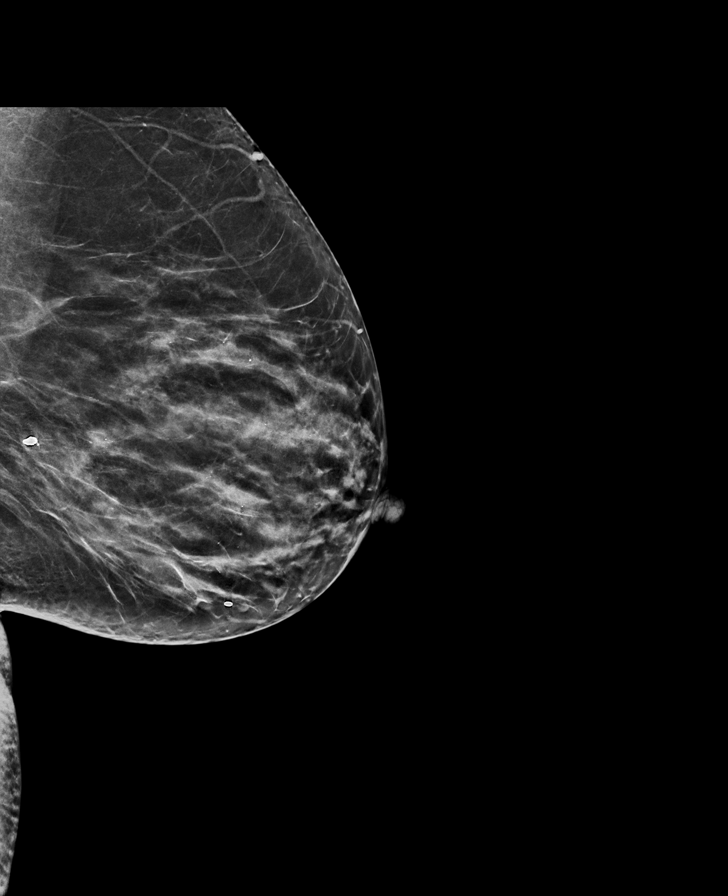

[R MLO synth-2D]
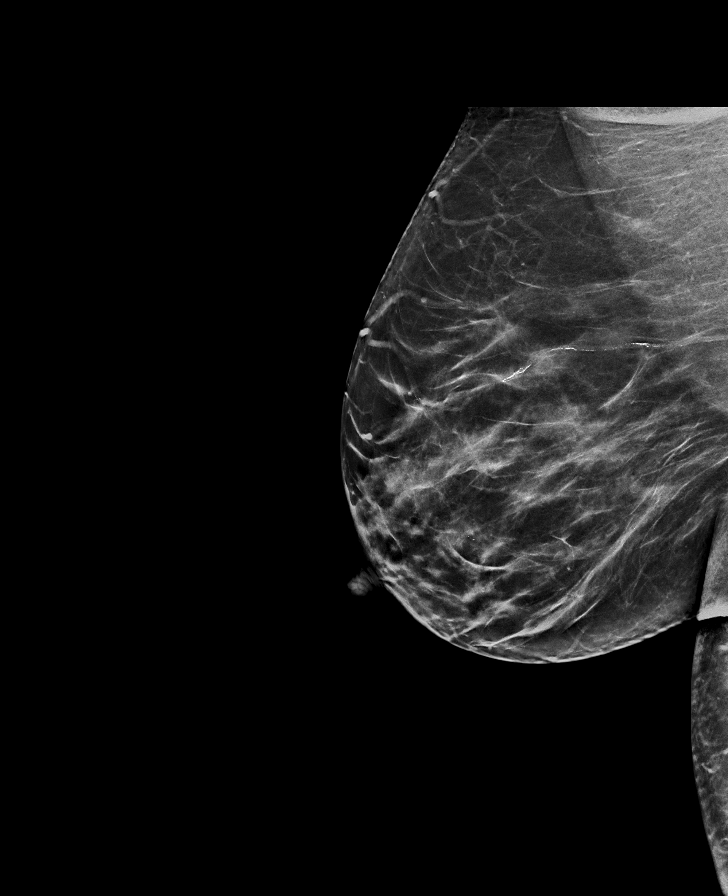

[L CC tomo · tomo slice 30/59.0]
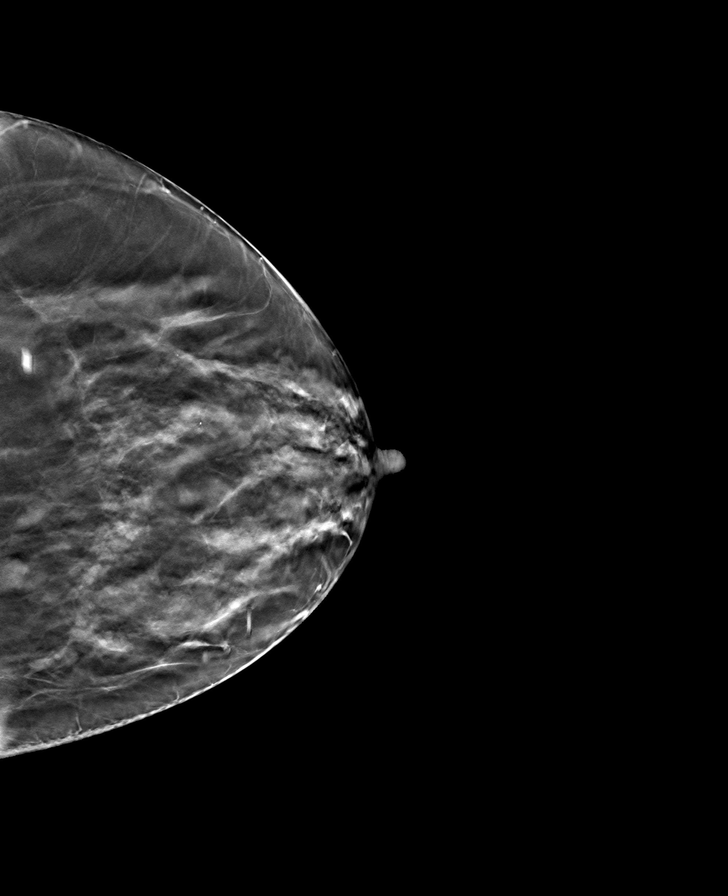

[R CC tomo · tomo slice 31/60.0]
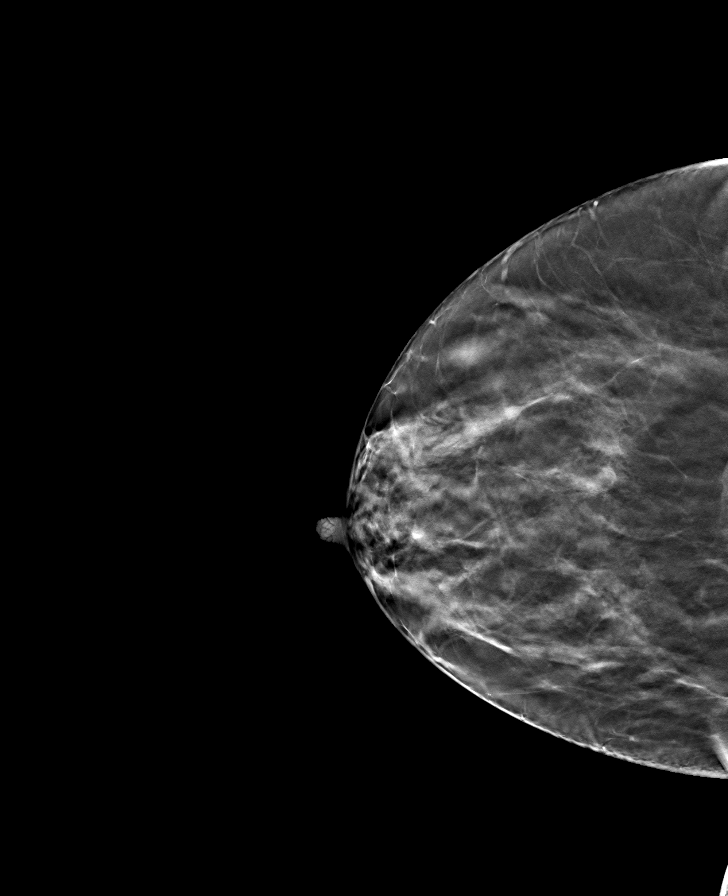

[R MLO tomo · tomo slice 35/69.0]
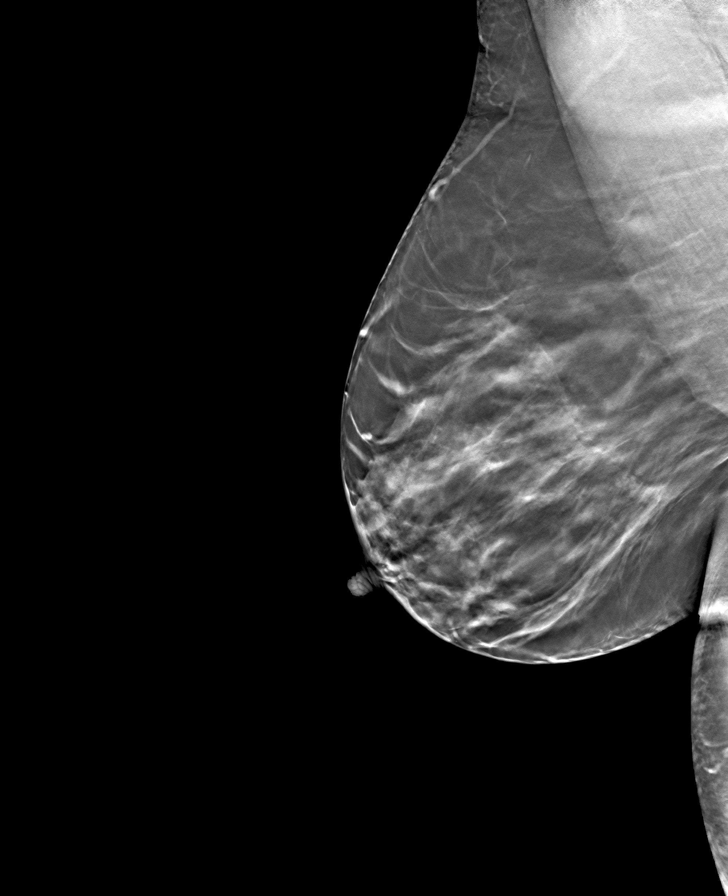

[L MLO tomo · tomo slice 33/64.0]
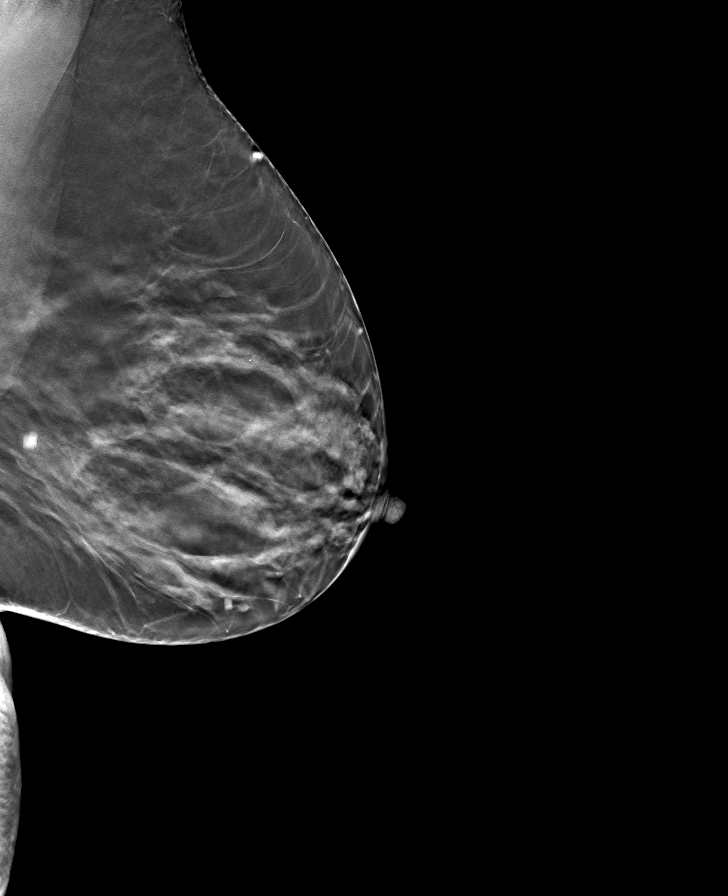

[8 of 24 positions shown; findings below may reference images not displayed]

ACR Breast Density Category c: The breast tissue is heterogeneously
dense, which may obscure small masses.
FINDINGS: In the right breast a possible asymmetry requires further
evaluation.

In the left breast a possible asymmetry and left breast
calcifications require further evaluation.

Images were processed with CAD.
IMPRESSION: Further evaluation is suggested for possible asymmetry in the right
breast.

Further evaluation is suggested for possible asymmetry and
calcifications in the left breast.

RECOMMENDATION:
Diagnostic mammogram and possibly ultrasound of both breasts.
(Code:[id number])

The patient will be contacted regarding the findings, and additional
imaging will be scheduled.

BI-RADS CATEGORY  0: Incomplete. Need additional imaging evaluation
and/or prior mammograms for comparison.

## 2021-03-19 DIAGNOSIS — Z299 Encounter for prophylactic measures, unspecified: Secondary | ICD-10-CM | POA: Diagnosis not present

## 2021-03-19 DIAGNOSIS — J45909 Unspecified asthma, uncomplicated: Secondary | ICD-10-CM | POA: Diagnosis not present

## 2021-03-19 DIAGNOSIS — I10 Essential (primary) hypertension: Secondary | ICD-10-CM | POA: Diagnosis not present

## 2021-03-19 DIAGNOSIS — E1165 Type 2 diabetes mellitus with hyperglycemia: Secondary | ICD-10-CM | POA: Diagnosis not present

## 2021-03-19 DIAGNOSIS — Z6827 Body mass index (BMI) 27.0-27.9, adult: Secondary | ICD-10-CM | POA: Diagnosis not present

## 2021-06-11 ENCOUNTER — Other Ambulatory Visit: Payer: Self-pay | Admitting: Nurse Practitioner

## 2021-06-11 DIAGNOSIS — E785 Hyperlipidemia, unspecified: Secondary | ICD-10-CM

## 2021-06-14 NOTE — Telephone Encounter (Signed)
Sent message to schedule appointment 06/14/21

## 2021-06-24 DIAGNOSIS — E1165 Type 2 diabetes mellitus with hyperglycemia: Secondary | ICD-10-CM | POA: Diagnosis not present

## 2021-06-24 DIAGNOSIS — Z299 Encounter for prophylactic measures, unspecified: Secondary | ICD-10-CM | POA: Diagnosis not present

## 2021-06-24 DIAGNOSIS — I10 Essential (primary) hypertension: Secondary | ICD-10-CM | POA: Diagnosis not present

## 2021-06-24 DIAGNOSIS — Z6827 Body mass index (BMI) 27.0-27.9, adult: Secondary | ICD-10-CM | POA: Diagnosis not present

## 2021-06-28 DIAGNOSIS — I10 Essential (primary) hypertension: Secondary | ICD-10-CM | POA: Diagnosis not present

## 2021-06-28 DIAGNOSIS — U071 COVID-19: Secondary | ICD-10-CM | POA: Diagnosis not present

## 2021-06-28 DIAGNOSIS — Z299 Encounter for prophylactic measures, unspecified: Secondary | ICD-10-CM | POA: Diagnosis not present

## 2021-07-29 NOTE — Telephone Encounter (Signed)
Second request- to schedule appointment for medication follow up 07/29/21 ?

## 2021-09-06 DIAGNOSIS — J45901 Unspecified asthma with (acute) exacerbation: Secondary | ICD-10-CM | POA: Diagnosis not present

## 2021-09-06 DIAGNOSIS — Z789 Other specified health status: Secondary | ICD-10-CM | POA: Diagnosis not present

## 2021-09-06 DIAGNOSIS — J45909 Unspecified asthma, uncomplicated: Secondary | ICD-10-CM | POA: Diagnosis not present

## 2021-09-06 DIAGNOSIS — R1011 Right upper quadrant pain: Secondary | ICD-10-CM | POA: Diagnosis not present

## 2021-09-06 DIAGNOSIS — Z299 Encounter for prophylactic measures, unspecified: Secondary | ICD-10-CM | POA: Diagnosis not present

## 2021-09-06 DIAGNOSIS — I10 Essential (primary) hypertension: Secondary | ICD-10-CM | POA: Diagnosis not present

## 2021-09-07 DIAGNOSIS — Z6826 Body mass index (BMI) 26.0-26.9, adult: Secondary | ICD-10-CM | POA: Diagnosis not present

## 2021-09-07 DIAGNOSIS — Z1151 Encounter for screening for human papillomavirus (HPV): Secondary | ICD-10-CM | POA: Diagnosis not present

## 2021-09-07 DIAGNOSIS — Z779 Other contact with and (suspected) exposures hazardous to health: Secondary | ICD-10-CM | POA: Diagnosis not present

## 2021-09-07 DIAGNOSIS — Z124 Encounter for screening for malignant neoplasm of cervix: Secondary | ICD-10-CM | POA: Diagnosis not present

## 2021-09-16 DIAGNOSIS — K37 Unspecified appendicitis: Secondary | ICD-10-CM | POA: Diagnosis not present

## 2021-09-16 DIAGNOSIS — R1011 Right upper quadrant pain: Secondary | ICD-10-CM | POA: Diagnosis not present

## 2021-09-16 DIAGNOSIS — R10811 Right upper quadrant abdominal tenderness: Secondary | ICD-10-CM | POA: Diagnosis not present

## 2021-09-20 DIAGNOSIS — R0602 Shortness of breath: Secondary | ICD-10-CM | POA: Diagnosis not present

## 2021-09-20 DIAGNOSIS — Z789 Other specified health status: Secondary | ICD-10-CM | POA: Diagnosis not present

## 2021-09-20 DIAGNOSIS — I10 Essential (primary) hypertension: Secondary | ICD-10-CM | POA: Diagnosis not present

## 2021-09-20 DIAGNOSIS — E1165 Type 2 diabetes mellitus with hyperglycemia: Secondary | ICD-10-CM | POA: Diagnosis not present

## 2021-09-20 DIAGNOSIS — Z299 Encounter for prophylactic measures, unspecified: Secondary | ICD-10-CM | POA: Diagnosis not present

## 2021-09-20 DIAGNOSIS — J4530 Mild persistent asthma, uncomplicated: Secondary | ICD-10-CM | POA: Diagnosis not present

## 2021-09-21 DIAGNOSIS — Z1231 Encounter for screening mammogram for malignant neoplasm of breast: Secondary | ICD-10-CM | POA: Diagnosis not present

## 2021-10-05 ENCOUNTER — Other Ambulatory Visit: Payer: Self-pay | Admitting: Family Medicine

## 2021-10-05 DIAGNOSIS — Z299 Encounter for prophylactic measures, unspecified: Secondary | ICD-10-CM | POA: Diagnosis not present

## 2021-10-05 DIAGNOSIS — E119 Type 2 diabetes mellitus without complications: Secondary | ICD-10-CM

## 2021-10-05 DIAGNOSIS — E1165 Type 2 diabetes mellitus with hyperglycemia: Secondary | ICD-10-CM | POA: Diagnosis not present

## 2021-10-05 DIAGNOSIS — I10 Essential (primary) hypertension: Secondary | ICD-10-CM | POA: Diagnosis not present

## 2021-10-07 NOTE — Telephone Encounter (Signed)
Sent my chart message to schedule appointment 10/07/21

## 2021-11-10 NOTE — Telephone Encounter (Signed)
Sent second my chart message to schedule appointment 7/26

## 2021-11-19 NOTE — Telephone Encounter (Signed)
No response to message and call.

## 2022-01-23 DIAGNOSIS — Z23 Encounter for immunization: Secondary | ICD-10-CM | POA: Diagnosis not present

## 2022-01-26 DIAGNOSIS — Z113 Encounter for screening for infections with a predominantly sexual mode of transmission: Secondary | ICD-10-CM | POA: Diagnosis not present

## 2022-01-26 DIAGNOSIS — N760 Acute vaginitis: Secondary | ICD-10-CM | POA: Diagnosis not present

## 2022-02-06 ENCOUNTER — Other Ambulatory Visit: Payer: Self-pay | Admitting: Family Medicine

## 2022-02-06 DIAGNOSIS — E119 Type 2 diabetes mellitus without complications: Secondary | ICD-10-CM

## 2022-02-17 DIAGNOSIS — E119 Type 2 diabetes mellitus without complications: Secondary | ICD-10-CM | POA: Diagnosis not present

## 2022-05-04 ENCOUNTER — Telehealth: Payer: Self-pay | Admitting: Family Medicine

## 2022-05-04 NOTE — Telephone Encounter (Signed)
No answer unable to leave a message for patient to call back and schedule Medicare Annual Wellness Visit (AWV) in office.   If unable to come into the office for AWV,  please offer to do virtually or by telephone.  Last AWV: 10/30/2017   Please schedule at anytime with [facility]-Nurse Health Advisor.  30 minute appointment   Any questions, please contact me at [phone]   Thank you,   [facility]  Ambulatory Clinical Support for [place] [facility] ??[phone] or ??[phone]

## 2022-05-13 ENCOUNTER — Encounter: Payer: Self-pay | Admitting: Family Medicine

## 2022-05-13 ENCOUNTER — Ambulatory Visit (INDEPENDENT_AMBULATORY_CARE_PROVIDER_SITE_OTHER): Payer: Medicare Other | Admitting: Family Medicine

## 2022-05-13 DIAGNOSIS — I10 Essential (primary) hypertension: Secondary | ICD-10-CM | POA: Diagnosis not present

## 2022-05-13 DIAGNOSIS — D721 Eosinophilia, unspecified: Secondary | ICD-10-CM

## 2022-05-13 DIAGNOSIS — E785 Hyperlipidemia, unspecified: Secondary | ICD-10-CM | POA: Diagnosis not present

## 2022-05-13 DIAGNOSIS — Z130 Encounter for screening for diseases of the blood and blood-forming organs and certain disorders involving the immune mechanism: Secondary | ICD-10-CM

## 2022-05-13 DIAGNOSIS — E119 Type 2 diabetes mellitus without complications: Secondary | ICD-10-CM | POA: Diagnosis not present

## 2022-05-13 NOTE — Patient Instructions (Signed)
Labs when you like.  Continue your medications.  Follow up in 6 months.

## 2022-05-16 LAB — CMP14+EGFR
ALT: 22 IU/L (ref 0–32)
AST: 31 IU/L (ref 0–40)
Albumin/Globulin Ratio: 1.5 (ref 1.2–2.2)
Albumin: 4.7 g/dL (ref 3.8–4.8)
Alkaline Phosphatase: 102 IU/L (ref 44–121)
BUN/Creatinine Ratio: 24 (ref 12–28)
BUN: 25 mg/dL (ref 8–27)
Bilirubin Total: 0.3 mg/dL (ref 0.0–1.2)
CO2: 25 mmol/L (ref 20–29)
Calcium: 9.6 mg/dL (ref 8.7–10.3)
Chloride: 98 mmol/L (ref 96–106)
Creatinine, Ser: 1.06 mg/dL — ABNORMAL HIGH (ref 0.57–1.00)
Globulin, Total: 3.1 g/dL (ref 1.5–4.5)
Glucose: 179 mg/dL — ABNORMAL HIGH (ref 70–99)
Potassium: 4.5 mmol/L (ref 3.5–5.2)
Sodium: 141 mmol/L (ref 134–144)
Total Protein: 7.8 g/dL (ref 6.0–8.5)
eGFR: 56 mL/min/{1.73_m2} — ABNORMAL LOW (ref >59)

## 2022-05-16 LAB — LIPID PANEL
Chol/HDL Ratio: 2.1 ratio (ref 0.0–4.4)
Cholesterol, Total: 211 mg/dL — ABNORMAL HIGH (ref 100–199)
HDL: 100 mg/dL (ref >39)
LDL Chol Calc (NIH): 85 mg/dL (ref 0–99)
Triglycerides: 155 mg/dL — ABNORMAL HIGH (ref 0–149)
VLDL Cholesterol Cal: 26 mg/dL (ref 5–40)

## 2022-05-16 LAB — HEMOGLOBIN A1C
Est. average glucose Bld gHb Est-mCnc: 137 mg/dL
Hgb A1c MFr Bld: 6.4 % — ABNORMAL HIGH (ref 4.8–5.6)

## 2022-05-16 LAB — MICROALBUMIN / CREATININE URINE RATIO
Creatinine, Urine: 134.5 mg/dL
Microalb/Creat Ratio: 29 mg/g creat (ref 0–29)
Microalbumin, Urine: 38.5 ug/mL

## 2022-05-16 NOTE — Assessment & Plan Note (Signed)
Doing well.  Well-controlled.  Continue amlodipine, carvedilol, losartan, spironolactone.

## 2022-05-16 NOTE — Assessment & Plan Note (Signed)
Triglycerides are mildly elevated.  LDL fairly well-controlled.  Continue lovastatin.

## 2022-05-16 NOTE — Progress Notes (Signed)
Subjective:  Patient ID: Bianca Arroyo, female    DOB: Nov 18, 1950  Age: 72 y.o. MRN: [id number]  CC: Chief Complaint  Patient presents with   Establish Care    Pt did go to [facility]; former pt of Dr.Taylor    HPI:  72 year old female presents to establish care.  Patient states that she is feeling well.  Patient's hypertension is stable on amlodipine, spironolactone.\  Unsure the status of patient's type 2 diabetes.  She needs an A1c.  She is currently on metformin and glipizide.  Needs lipid panel to assess lipids.  She is currently on lovastatin and tolerating.  Patient [facility]  She states that her mammogram is up-to-date although we do not have records.  Patient Active Problem List   Diagnosis Date Noted   Hyperlipidemia 05/13/2022   Osteopenia 11/01/2016   Cough variant asthma 10/28/2014   Allergic rhinitis 12/17/2013   Gout 02/25/2013   Diabetes ([place]) 08/23/2012   Essential hypertension, benign 08/23/2012    Social Hx   Social History   Socioeconomic History   Marital status: Widowed    Spouse name: Not on file   Number of children: Not on file   Years of education: Not on file   Highest education level: Not on file  Occupational History   Not on file  Tobacco Use   Smoking status: Never   Smokeless tobacco: Never  Vaping Use   Vaping Use: Never used  Substance and Sexual Activity   Alcohol use: Yes    Alcohol/week: 3.0 standard drinks of alcohol    Types: 3 Standard drinks or equivalent per week    Comment: 3-4 times per week   Drug use: No   Sexual activity: Not on file  Other Topics Concern   Not on file  Social History Narrative   Not on file   Social Determinants of Health   Financial Resource Strain: Not on file  Food Insecurity: Not on file  Transportation Needs: Not on file  Physical Activity: Not on file  Stress: Not on file  Social Connections: Not on file    Review of Systems   Respiratory: Negative.    Cardiovascular: Negative.    Objective:  BP 126/74   Pulse 72   Wt 148 lb 3.2 oz (67.2 kg)   SpO2 100%   BMI 27.11 kg/m      05/13/2022    1:40 PM 12/01/2020    2:26 PM 11/10/2020    1:20 PM  BP/Weight  Systolic BP 132 440 102  Diastolic BP 74 77 70  Wt. (Lbs) 148.2 144.4 149.6  BMI 27.11 kg/m2 26.41 kg/m2 27.36 kg/m2    Physical Exam Vitals and nursing note reviewed.  Constitutional:      General: She is not in acute distress.    Appearance: Normal appearance.  HENT:     Head: Normocephalic and atraumatic.  Cardiovascular:     Rate and Rhythm: Normal rate and regular rhythm.  Pulmonary:     Effort: Pulmonary effort is normal.     Breath sounds: Normal breath sounds. No wheezing, rhonchi or rales.  Neurological:     Mental Status: She is alert.  Psychiatric:        Mood and Affect: Mood normal.        Behavior: Behavior normal.     Lab Results  Component Value Date   WBC 7.5 07/27/2020   HGB 13.6 07/27/2020   HCT  41.1 07/27/2020   PLT 270 07/27/2020   GLUCOSE 179 (H) 05/13/2022   CHOL 211 (H) 05/13/2022   TRIG 155 (H) 05/13/2022   HDL 100 05/13/2022   LDLCALC 85 05/13/2022   ALT 22 05/13/2022   AST 31 05/13/2022   NA 141 05/13/2022   K 4.5 05/13/2022   CL 98 05/13/2022   CREATININE 1.06 (H) 05/13/2022   BUN 25 05/13/2022   CO2 25 05/13/2022   TSH 0.505 04/03/2015   HGBA1C 6.4 (H) 05/13/2022   MICROALBUR 1.49 11/22/2013     Assessment & Plan:   Problem List Items Addressed This Visit       Cardiovascular and Mediastinum   Essential hypertension, benign    Doing well.  Well-controlled.  Continue amlodipine, carvedilol, losartan, spironolactone.        Endocrine   Diabetes ([place])    A1c returned at 6.4.  Continue glipizide and metformin.      Relevant Medications   metFORMIN (GLUCOPHAGE) 500 MG tablet   Other Relevant Orders   CMP14+EGFR (Completed)   Microalbumin / creatinine urine ratio (Completed)    Hemoglobin A1c (Completed)     Other   Hyperlipidemia    Triglycerides are mildly elevated.  LDL fairly well-controlled.  Continue lovastatin.      Relevant Orders   Lipid panel (Completed)    Follow-up:  Return in about 6 months (around 11/11/2022).  [place]

## 2022-05-16 NOTE — Assessment & Plan Note (Signed)
A1c returned at 6.4.  Continue glipizide and metformin.

## 2022-06-03 ENCOUNTER — Other Ambulatory Visit: Payer: Self-pay | Admitting: Family Medicine

## 2022-06-03 DIAGNOSIS — E119 Type 2 diabetes mellitus without complications: Secondary | ICD-10-CM

## 2022-06-07 MED ORDER — GLIPIZIDE 5 MG PO TABS: ORAL_TABLET | ORAL | 0 refills | Status: DC

## 2022-06-16 ENCOUNTER — Encounter: Payer: Self-pay | Admitting: Radiology

## 2022-06-17 ENCOUNTER — Other Ambulatory Visit: Payer: Self-pay | Admitting: Family Medicine

## 2022-06-17 DIAGNOSIS — Z1231 Encounter for screening mammogram for malignant neoplasm of breast: Secondary | ICD-10-CM

## 2022-06-21 ENCOUNTER — Telehealth: Payer: Self-pay | Admitting: Family Medicine

## 2022-06-21 NOTE — Telephone Encounter (Signed)
Contacted Ardoch to schedule their annual wellness visit. Appointment made for 07/08/2022.  Thank you,  Colletta [place],  [place] [facility] Direct Dial ??[phone]

## 2022-06-24 ENCOUNTER — Ambulatory Visit
Admission: RE | Admit: 2022-06-24 | Discharge: 2022-06-24 | Disposition: A | Discharge status: Home / Self Care | Payer: Medicare Other | Source: Ambulatory Visit | Attending: Family Medicine | Admitting: Family Medicine

## 2022-06-24 DIAGNOSIS — Z1231 Encounter for screening mammogram for malignant neoplasm of breast: Secondary | ICD-10-CM

## 2022-07-08 ENCOUNTER — Ambulatory Visit (INDEPENDENT_AMBULATORY_CARE_PROVIDER_SITE_OTHER): Payer: Medicare Other

## 2022-07-08 ENCOUNTER — Other Ambulatory Visit: Payer: Self-pay | Admitting: Family Medicine

## 2022-07-08 VITALS — Ht 62.0 in | Wt 148.0 lb

## 2022-07-08 DIAGNOSIS — Z0000 Encounter for general adult medical examination without abnormal findings: Secondary | ICD-10-CM | POA: Diagnosis not present

## 2022-07-08 NOTE — Progress Notes (Signed)
Subjective:   Bianca Arroyo is a 72 y.o. female who presents for Medicare Annual (Subsequent) preventive examination.  I connected with  Corabelle S Castagnola on 07/08/22 by a audio enabled telemedicine application and verified that I am speaking with the correct person using two identifiers.  Patient Location: Home  Provider Location: Office/Clinic  I discussed the limitations of evaluation and management by telemedicine. The patient expressed understanding and agreed to proceed.  Review of Systems     Cardiac Risk Factors include: advanced age (>60men, >65 women);diabetes mellitus;dyslipidemia;hypertension     Objective:    Today's Vitals   07/08/22 1642  Weight: 148 lb (67.1 kg)  Height: 5\' 2"  (1.575 m)   Body mass index is 27.07 kg/m.     07/08/2022    4:45 PM 06/11/2019    8:49 AM 06/28/2016   12:53 PM 12/06/2013    8:37 AM  Advanced Directives  Does Patient Have a Medical Advance Directive? Yes No No No  Type of Advance Directive Living will     Does patient want to make changes to medical advance directive? No - Patient declined     Would patient like information on creating a medical advance directive?  No - Patient declined  Yes - Educational materials given    Current Medications (verified) Outpatient Encounter Medications as of 07/08/2022  Medication Sig   albuterol (PROVENTIL HFA;VENTOLIN HFA) 108 (90 Base) MCG/ACT inhaler Inhale 2 puffs into the lungs every 6 (six) hours as needed for wheezing or shortness of breath.   amLODipine (NORVASC) 5 MG tablet Take 1 tablet by mouth once daily   aspirin 81 MG chewable tablet Chew 81 mg by mouth every morning.    blood glucose meter kit and supplies Dispense based on patient and insurance preference. Use to test glucose once daily.  (FOR ICD -10 E11.9)   carvedilol (COREG) 25 MG tablet TAKE 1 TABLET BY MOUTH TWICE DAILY WITH A MEAL   fluticasone (FLONASE) 50 MCG/ACT nasal spray USE TWO SPRAY(S) IN EACH NOSTRIL ONCE  DAILY   fluticasone (FLOVENT HFA) 44 MCG/ACT inhaler Inhale 2 puffs by mouth twice daily   glipiZIDE (GLUCOTROL) 5 MG tablet TAKE 1 TABLET BY MOUTH TWICE DAILY BEFORE MEAL(S)   losartan (COZAAR) 100 MG tablet Take 1 tablet by mouth once daily   lovastatin (MEVACOR) 20 MG tablet Take 1 tablet (20 mg total) by mouth daily. APPT REQUIRED FOR FUTURE REFILLS   metFORMIN (GLUCOPHAGE) 500 MG tablet Take by mouth daily with breakfast.   montelukast (SINGULAIR) 10 MG tablet Take 10 mg by mouth at bedtime.   Multiple Vitamins-Minerals (ALIVE ONCE DAILY WOMENS 50+ PO) Take 1 tablet by mouth every morning.    naproxen (NAPROSYN) 500 MG tablet Take 1 tablet (500 mg total) by mouth 2 (two) times daily with a meal.   spironolactone (ALDACTONE) 50 MG tablet Take 1 tablet by mouth once daily   Tetrahydrozoline HCl (VISINE OP) Place 1 drop into both eyes daily as needed (dry eye relief).    valACYclovir (VALTREX) 500 MG tablet TAKE 1 TABLET BY MOUTH TWICE DAILY AS NEEDED FOR 3 DAYS FOR FLARES   vitamin B-12 (CYANOCOBALAMIN) 1000 MCG tablet Take 1,000 mcg by mouth once a week.    Facility-Administered Encounter Medications as of 07/08/2022  Medication   nitroGLYCERIN (NITRODUR - Dosed in mg/24 hr) patch 0.3 mg    Allergies (verified) Colchicine   History: Past Medical History:  Diagnosis Date   Allergy 1965  seasonal, colcrys (~2014)   Arthritis 1990   Asthma 2016   H/O echocardiogram    a. 05/2017: echo showing EF of 65-70%, no regional WMA, moderate LVH, and mild MR.    Hyperlipidemia    Hypertension    Type 2 diabetes mellitus ([place])    Past Surgical History:  Procedure Laterality Date   BREAST BIOPSY Left    benign   CESAREAN SECTION     COLONOSCOPY     COLONOSCOPY N/A 12/06/2013   Dr. Oneida Alar: Left colon redundant, diverticulosis, 2 polyps removed, one tubular adenoma the other hyperplastic.  Consider colonoscopy in 5 to 10 years with overtube   COLONOSCOPY WITH PROPOFOL N/A 06/11/2019    Procedure: COLONOSCOPY WITH PROPOFOL;  Surgeon: Danie Binder, MD;  Location: [facility];  Service: Endoscopy;  Laterality: N/A;  10:30am   EYE SURGERY  ~1995   [facility]   FRACTURE SURGERY  2003 and 2012   left wrist and right thumb   Left wrist surgery     POLYPECTOMY  06/11/2019   Procedure: POLYPECTOMY;  Surgeon: Danie Binder, MD;  Location: [facility];  Service: Endoscopy;;   Right trigger thumb  2013   tummy tuck     Family History  Problem Relation Age of Onset   Diabetes Father    Hypertension Father    Diabetes Mother    High blood pressure Mother    High blood pressure Sister    Diabetes Sister    Colon cancer Neg Hx    Social History   Socioeconomic History   Marital status: Widowed    Spouse name: Not on file   Number of children: Not on file   Years of education: Not on file   Highest education level: Not on file  Occupational History   Not on file  Tobacco Use   Smoking status: Never   Smokeless tobacco: Never   Tobacco comments:    none  Vaping Use   Vaping Use: Never used  Substance and Sexual Activity   Alcohol use: Yes    Alcohol/week: 4.0 standard drinks of alcohol    Types: 4 Shots of liquor per week    Comment: 3-4 times per week   Drug use: No   Sexual activity: Yes    Birth control/protection: None  Other Topics Concern   Not on file  Social History Narrative   Not on file   Social Determinants of Health   Financial Resource Strain: Low Risk  (07/08/2022)   Overall Financial Resource Strain (CARDIA)    Difficulty of Paying Living Expenses: Not hard at all  Food Insecurity: No Food Insecurity (07/08/2022)   Hunger Vital Sign    Worried About Running Out of Food in the Last Year: Never true    Ran Out of Food in the Last Year: Never true  Transportation Needs: No Transportation Needs (07/08/2022)   PRAPARE - Hydrologist (Medical): No    Lack of Transportation (Non-Medical): No  Physical Activity:  Sufficiently Active (07/08/2022)   Exercise Vital Sign    Days of Exercise per Week: 3 days    Minutes of Exercise per Session: 50 min  Stress: No Stress Concern Present (07/08/2022)   [place]    Feeling of Stress : Only a little  Social Connections: Moderately Integrated (07/08/2022)   Social Connection and Isolation Panel [NHANES]    Frequency of Communication with Friends  and Family: Once a week    Frequency of Social Gatherings with Friends and Family: More than three times a week    Attends Religious Services: More than 4 times per year    Active Member of [employer] or Organizations: Yes    Attends Archivist Meetings: More than 4 times per year    Marital Status: Widowed    Tobacco Counseling Counseling given: Not Answered Tobacco comments: none   Clinical Intake:  Pre-visit preparation completed: Yes  Pain : No/denies pain  Diabetes: Yes CBG done?: No Did pt. bring in CBG monitor from home?: No  How often do you need to have someone help you when you read instructions, pamphlets, or other written materials from your doctor or pharmacy?: 1 - Never  Diabetic?Yes   Nutrition Risk Assessment:  Has the patient had any N/V/D within the last 2 months?  No  Does the patient have any non-healing wounds?  No  Has the patient had any unintentional weight loss or weight gain?  No   Diabetes:  Is the patient diabetic?  Yes  If diabetic, was a CBG obtained today?  No  Did the patient bring in their glucometer from home?  No  How often do you monitor your CBG's? daily.   Financial Strains and Diabetes Management:  Are you having any financial strains with the device, your supplies or your medication? No .  Does the patient want to be seen by Chronic Care Management for management of their diabetes?   No  Would the patient like to be referred to a Nutritionist or for Diabetic Management?  No    Diabetic Exams:  Diabetic Eye Exam: Completed records requested Diabetic Foot Exam: Completed 05/13/22   Interpreter Needed?: No  Information entered by :: Denman George LPN   Activities of Daily Living    07/08/2022    4:46 PM 07/07/2022    9:42 AM  In your present state of health, do you have any difficulty performing the following activities:  Hearing? 0 0  Vision? 0 0  Difficulty concentrating or making decisions? 0 0  Walking or climbing stairs? 0 0  Dressing or bathing? 0 0  Doing errands, shopping? 0 0  Preparing Food and eating ? N N  Using the Toilet? N N  In the past six months, have you accidently leaked urine? N N  Do you have problems with loss of bowel control? N N  Managing your Medications? N N  Managing your Finances? N N  Housekeeping or managing your Housekeeping? N N    Patient Care Team: Coral Spikes, DO as PCP - General (Family Medicine) Satira Sark, MD as PCP - Cardiology (Cardiology)  Indicate any recent Medical Services you may have received from other than [facility] providers in the past year (date may be approximate).     Assessment:   This is a routine wellness examination for Khari.  Hearing/Vision screen Hearing Screening - Comments:: Denies hearing difficulties  Vision Screening - Comments:: Wears rx glasses - up to date with routine eye exams with [facility] Dr. Linna Hoff    Dietary issues and exercise activities discussed: Current Exercise Habits: Home exercise routine, Type of exercise: walking, Time (Minutes): 40, Frequency (Times/Week): 3, Weekly Exercise (Minutes/Week): 120   Goals Addressed             This Visit's Progress    Remain active and independent        Depression Screen  07/08/2022    4:44 PM 05/13/2022    1:41 PM 07/27/2020    2:48 PM 10/30/2017    2:34 PM 10/14/2016   10:14 AM 02/23/2015   12:59 PM 12/17/2014    6:11 PM  PHQ 2/9 Scores  PHQ - 2 Score 0 1 0 1 0 0 0  PHQ- 9 Score    3       Fall  Risk    07/08/2022    4:43 PM 07/07/2022    9:42 AM 05/13/2022    1:41 PM 07/27/2020    2:47 PM 11/01/2019   11:13 AM  Fall Risk   Falls in the past year? 0 0 0 0 0  Number falls in past yr: 0 0 0 0   Injury with Fall? 0 0 0 0   Risk for fall due to : No Fall Risks  No Fall Risks No Fall Risks   Follow up Falls prevention discussed;Education provided;Falls evaluation completed  Falls evaluation completed Falls evaluation completed Falls evaluation completed    FALL RISK PREVENTION PERTAINING TO THE HOME:  Any stairs in or around the home? No  If so, are there any without handrails? No  Home free of loose throw rugs in walkways, pet beds, electrical cords, etc? Yes  Adequate lighting in your home to reduce risk of falls? Yes   ASSISTIVE DEVICES UTILIZED TO PREVENT FALLS:  Life alert? No  Use of a cane, walker or w/c? No  Grab bars in the bathroom? Yes  Shower chair or bench in shower? No  Elevated toilet seat or a handicapped toilet? Yes   TIMED UP AND GO:  Was the test performed? No . Telephonic visit   Cognitive Function:        07/08/2022    4:46 PM  6CIT Screen  What Year? 0 points  What month? 0 points  What time? 0 points  Count back from 20 0 points  Months in reverse 0 points  Repeat phrase 0 points  Total Score 0 points    Immunizations Immunization History  Administered Date(s) Administered   COVID-19, mRNA, vaccine(Comirnaty)12 years and older 01/23/2022   Hepatitis B 12/29/2015, 02/09/2016   Hepatitis B, ADULT 08/04/2016   Influenza Split 02/12/2013   Influenza,inj,Quad PF,6+ Mos 02/21/2014, 01/06/2015, 04/07/2015, 01/11/2017   Influenza-Unspecified 01/17/2011, 12/29/2015, 01/16/2018, 01/17/2019, 01/20/2020, 01/17/2021, 12/17/2021   Meningococcal Conjugate 01/04/2016   Moderna Sars-Covid-2 Vaccination 05/31/2019, 06/21/2019, 01/18/2021   Pneumococcal Conjugate-13 02/23/2015, 10/24/2016   Pneumococcal Polysaccharide-23 12/05/2012, 08/04/2016    RSV,unspecified 01/23/2022   Tdap 12/29/2015   Zoster Recombinat (Shingrix) 08/04/2016, 10/24/2016, 01/18/2021    TDAP status: Up to date  Flu Vaccine status: Up to date  Pneumococcal vaccine status: Up to date  Covid-19 vaccine status: Information provided on how to obtain vaccines.   Qualifies for Shingles Vaccine? Yes   Zostavax completed No   Shingrix Completed?: Yes  Screening Tests Health Maintenance  Topic Date Due   MAMMOGRAM  10/30/2021   HEMOGLOBIN A1C  11/11/2022   OPHTHALMOLOGY EXAM  03/19/2023   Diabetic kidney evaluation - eGFR measurement  05/14/2023   Diabetic kidney evaluation - Urine ACR  05/14/2023   FOOT EXAM  05/14/2023   Medicare Annual Wellness (AWV)  07/08/2023   DTaP/Tdap/Td (2 - Td or Tdap) 12/28/2025   COLONOSCOPY (Pts 45-33yrs Insurance coverage will need to be confirmed)  06/10/2029   Pneumonia Vaccine 34+ Years old  Completed   INFLUENZA VACCINE  Completed   DEXA SCAN  Completed   COVID-19 Vaccine  Completed   Hepatitis C Screening  Completed   Zoster Vaccines- Shingrix  Completed   HPV VACCINES  Aged Out    Health Maintenance  Health Maintenance Due  Topic Date Due   MAMMOGRAM  10/30/2021    Colorectal cancer screening: Type of screening: Colonoscopy. Completed 06/11/19. Repeat every 10 years  Mammogram status: Ordered and scheduled for 10/11/22. Pt provided with contact info and advised to call to schedule appt.   Bone Density status: Completed 11/01/16. Results reflect: Bone density results: OSTEOPENIA. Repeat every 2 years.  Lung Cancer Screening: (Low Dose CT Chest recommended if Age 68-80 years, 30 pack-year currently smoking OR have quit w/in 15years.) does not qualify.   Lung Cancer Screening Referral: n/a  Additional Screening:  Hepatitis C Screening: does qualify; Completed 04/03/15  Vision Screening: Recommended annual ophthalmology exams for early detection of glaucoma and other disorders of the eye. Is the patient up  to date with their annual eye exam?  Yes  Who is the provider or what is the name of the office in which the patient attends annual eye exams? [facility] Dr. Linna Hoff If pt is not established with a provider, would they like to be referred to a provider to establish care? No .   Dental Screening: Recommended annual dental exams for proper oral hygiene  Community Resource Referral / Chronic Care Management: CRR required this visit?  No   CCM required this visit?  No      Plan:     I have personally reviewed and noted the following in the patient's chart:   Medical and social history Use of alcohol, tobacco or illicit drugs  Current medications and supplements including opioid prescriptions. Patient is not currently taking opioid prescriptions. Functional ability and status Nutritional status Physical activity Advanced directives List of other physicians Hospitalizations, surgeries, and ER visits in previous 12 months Vitals Screenings to include cognitive, depression, and falls Referrals and appointments  In addition, I have reviewed and discussed with patient certain preventive protocols, quality metrics, and best practice recommendations. A written personalized care plan for preventive services as well as general preventive health recommendations were provided to patient.     Vanetta Mulders, [place]   075-GRM   Due to this being a virtual visit, the after visit summary with patients personalized plan was offered to patient via mail or my-chart. Patient would like to access on my-chart  Nurse Notes: No concerns

## 2022-07-08 NOTE — Patient Instructions (Signed)
Ms. Bianca Arroyo , Thank you for taking time to come for your Medicare Wellness Visit. I appreciate your ongoing commitment to your health goals. Please review the following plan we discussed and let me know if I can assist you in the future.   These are the goals we discussed:  Goals      Remain active and independent        This is a list of the screening recommended for you and due dates:  Health Maintenance  Topic Date Due   Mammogram  10/30/2021   Hemoglobin A1C  11/11/2022   Eye exam for diabetics  03/19/2023   Yearly kidney function blood test for diabetes  05/14/2023   Yearly kidney health urinalysis for diabetes  05/14/2023   Complete foot exam   05/14/2023   Medicare Annual Wellness Visit  07/08/2023   DTaP/Tdap/Td vaccine (2 - Td or Tdap) 12/28/2025   Colon Cancer Screening  06/10/2029   Pneumonia Vaccine  Completed   Flu Shot  Completed   DEXA scan (bone density measurement)  Completed   COVID-19 Vaccine  Completed   Hepatitis C Screening: USPSTF Recommendation to screen - Ages 48-79 yo.  Completed   Zoster (Shingles) Vaccine  Completed   HPV Vaccine  Aged Out    Advanced directives: Please bring a copy of your health care power of attorney and living will to the office to be added to your chart at your convenience.   Conditions/risks identified: Aim for 30 minutes of exercise or brisk walking, 6-8 glasses of water, and 5 servings of fruits and vegetables each day.   Next appointment: Follow up in one year for your annual wellness visit    Preventive Care 65 Years and Older, Female Preventive care refers to lifestyle choices and visits with your health care provider that can promote health and wellness. What does preventive care include? A yearly physical exam. This is also called an annual well check. Dental exams once or twice a year. Routine eye exams. Ask your health care provider how often you should have your eyes checked. Personal lifestyle choices,  including: Daily care of your teeth and gums. Regular physical activity. Eating a healthy diet. Avoiding tobacco and drug use. Limiting alcohol use. Practicing safe sex. Taking low-dose aspirin every day. Taking vitamin and mineral supplements as recommended by your health care provider. What happens during an annual well check? The services and screenings done by your health care provider during your annual well check will depend on your age, overall health, lifestyle risk factors, and family history of disease. Counseling  Your health care provider may ask you questions about your: Alcohol use. Tobacco use. Drug use. Emotional well-being. Home and relationship well-being. Sexual activity. Eating habits. History of falls. Memory and ability to understand (cognition). Work and work Statistician. Reproductive health. Screening  You may have the following tests or measurements: Height, weight, and BMI. Blood pressure. Lipid and cholesterol levels. These may be checked every 5 years, or more frequently if you are over 34 years old. Skin check. Lung cancer screening. You may have this screening every year starting at age 92 if you have a 30-pack-year history of smoking and currently smoke or have quit within the past 15 years. Fecal occult blood test (FOBT) of the stool. You may have this test every year starting at age 13. Flexible sigmoidoscopy or colonoscopy. You may have a sigmoidoscopy every 5 years or a colonoscopy every 10 years starting at age 34. Hepatitis C  blood test. Hepatitis B blood test. Sexually transmitted disease (STD) testing. Diabetes screening. This is done by checking your blood sugar (glucose) after you have not eaten for a while (fasting). You may have this done every 1-3 years. Bone density scan. This is done to screen for osteoporosis. You may have this done starting at age 48. Mammogram. This may be done every 1-2 years. Talk to your health care provider  about how often you should have regular mammograms. Talk with your health care provider about your test results, treatment options, and if necessary, the need for more tests. Vaccines  Your health care provider may recommend certain vaccines, such as: Influenza vaccine. This is recommended every year. Tetanus, diphtheria, and acellular pertussis (Tdap, Td) vaccine. You may need a Td booster every 10 years. Zoster vaccine. You may need this after age 73. Pneumococcal 13-valent conjugate (PCV13) vaccine. One dose is recommended after age 5. Pneumococcal polysaccharide (PPSV23) vaccine. One dose is recommended after age 44. Talk to your health care provider about which screenings and vaccines you need and how often you need them. This information is not intended to replace advice given to you by your health care provider. Make sure you discuss any questions you have with your health care provider. Document Released: 05/01/2015 Document Revised: 12/23/2015 Document Reviewed: 02/03/2015 Elsevier Interactive Patient Education  2017 [place] Prevention in the Home Falls can cause injuries. They can happen to people of all ages. There are many things you can do to make your home safe and to help prevent falls. What can I do on the outside of my home? Regularly fix the edges of walkways and driveways and fix any cracks. Remove anything that might make you trip as you walk through a door, such as a raised step or threshold. Trim any bushes or trees on the path to your home. Use bright outdoor lighting. Clear any walking paths of anything that might make someone trip, such as rocks or tools. Regularly check to see if handrails are loose or broken. Make sure that both sides of any steps have handrails. Any raised decks and porches should have guardrails on the edges. Have any leaves, snow, or ice cleared regularly. Use sand or salt on walking paths during winter. Clean up any spills in  your garage right away. This includes oil or grease spills. What can I do in the bathroom? Use night lights. Install grab bars by the toilet and in the tub and shower. Do not use towel bars as grab bars. Use non-skid mats or decals in the tub or shower. If you need to sit down in the shower, use a plastic, non-slip stool. Keep the floor dry. Clean up any water that spills on the floor as soon as it happens. Remove soap buildup in the tub or shower regularly. Attach bath mats securely with double-sided non-slip rug tape. Do not have throw rugs and other things on the floor that can make you trip. What can I do in the bedroom? Use night lights. Make sure that you have a [place] by your bed that is easy to reach. Do not use any sheets or blankets that are too big for your bed. They should not hang down onto the floor. Have a firm chair that has side arms. You can use this for support while you get dressed. Do not have throw rugs and other things on the floor that can make you trip. What can I do in the kitchen?  Clean up any spills right away. Avoid walking on wet floors. Keep items that you use a lot in easy-to-reach places. If you need to reach something above you, use a strong step stool that has a grab bar. Keep electrical cords out of the way. Do not use floor polish or wax that makes floors slippery. If you must use wax, use non-skid floor wax. Do not have throw rugs and other things on the floor that can make you trip. What can I do with my stairs? Do not leave any items on the stairs. Make sure that there are handrails on both sides of the stairs and use them. Fix handrails that are broken or loose. Make sure that handrails are as long as the stairways. Check any carpeting to make sure that it is firmly attached to the stairs. Fix any carpet that is loose or worn. Avoid having throw rugs at the top or bottom of the stairs. If you do have throw rugs, attach them to the floor with carpet  tape. Make sure that you have a Lesch switch at the top of the stairs and the bottom of the stairs. If you do not have them, ask someone to add them for you. What else can I do to help prevent falls? Wear shoes that: Do not have high heels. Have rubber bottoms. Are comfortable and fit you well. Are closed at the toe. Do not wear sandals. If you use a stepladder: Make sure that it is fully opened. Do not climb a closed stepladder. Make sure that both sides of the stepladder are locked into place. Ask someone to hold it for you, if possible. Clearly mark and make sure that you can see: Any grab bars or handrails. First and last steps. Where the edge of each step is. Use tools that help you move around (mobility aids) if they are needed. These include: Canes. Walkers. Scooters. Crutches. Turn on the lights when you go into a dark area. Replace any Dennen bulbs as soon as they burn out. Set up your furniture so you have a clear path. Avoid moving your furniture around. If any of your floors are uneven, fix them. If there are any pets around you, be aware of where they are. Review your medicines with your doctor. Some medicines can make you feel dizzy. This can increase your chance of falling. Ask your doctor what other things that you can do to help prevent falls. This information is not intended to replace advice given to you by your health care provider. Make sure you discuss any questions you have with your health care provider. Document Released: 01/29/2009 Document Revised: 09/10/2015 Document Reviewed: 05/09/2014 Elsevier Interactive Patient Education  2017 [employer].

## 2022-07-11 MED ORDER — METFORMIN HCL 500 MG PO TABS
500.0000 mg | ORAL_TABLET | Freq: Two times a day (BID) | ORAL | 3 refills | Status: DC
  Filled 2022-07-11: qty 180, 90d supply, fill #0

## 2022-07-12 ENCOUNTER — Encounter (HOSPITAL_COMMUNITY): Payer: Self-pay

## 2022-07-12 ENCOUNTER — Other Ambulatory Visit: Payer: Self-pay

## 2022-07-12 ENCOUNTER — Other Ambulatory Visit (HOSPITAL_COMMUNITY): Payer: Self-pay

## 2022-07-23 ENCOUNTER — Other Ambulatory Visit: Payer: Self-pay

## 2022-07-23 ENCOUNTER — Emergency Department (HOSPITAL_COMMUNITY)
Admission: EM | Admit: 2022-07-23 | Discharge: 2022-07-23 | Disposition: A | Discharge status: Home / Self Care | Payer: Medicare Other | Attending: Emergency Medicine | Admitting: Emergency Medicine

## 2022-07-23 ENCOUNTER — Encounter (HOSPITAL_COMMUNITY): Payer: Self-pay | Admitting: Emergency Medicine

## 2022-07-23 DIAGNOSIS — S80861A Insect bite (nonvenomous), right lower leg, initial encounter: Secondary | ICD-10-CM | POA: Insufficient documentation

## 2022-07-23 DIAGNOSIS — W57XXXA Bitten or stung by nonvenomous insect and other nonvenomous arthropods, initial encounter: Secondary | ICD-10-CM | POA: Insufficient documentation

## 2022-07-23 DIAGNOSIS — S20469A Insect bite (nonvenomous) of unspecified back wall of thorax, initial encounter: Secondary | ICD-10-CM | POA: Insufficient documentation

## 2022-07-23 DIAGNOSIS — Z7982 Long term (current) use of aspirin: Secondary | ICD-10-CM | POA: Diagnosis not present

## 2022-07-23 DIAGNOSIS — S80862A Insect bite (nonvenomous), left lower leg, initial encounter: Secondary | ICD-10-CM | POA: Diagnosis not present

## 2022-07-23 DIAGNOSIS — S0086XA Insect bite (nonvenomous) of other part of head, initial encounter: Secondary | ICD-10-CM | POA: Insufficient documentation

## 2022-07-23 DIAGNOSIS — I10 Essential (primary) hypertension: Secondary | ICD-10-CM | POA: Diagnosis not present

## 2022-07-23 DIAGNOSIS — Z79899 Other long term (current) drug therapy: Secondary | ICD-10-CM | POA: Diagnosis not present

## 2022-07-23 DIAGNOSIS — Z7984 Long term (current) use of oral hypoglycemic drugs: Secondary | ICD-10-CM | POA: Insufficient documentation

## 2022-07-23 DIAGNOSIS — S40861A Insect bite (nonvenomous) of right upper arm, initial encounter: Secondary | ICD-10-CM | POA: Insufficient documentation

## 2022-07-23 DIAGNOSIS — E119 Type 2 diabetes mellitus without complications: Secondary | ICD-10-CM | POA: Diagnosis not present

## 2022-07-23 DIAGNOSIS — S40862A Insect bite (nonvenomous) of left upper arm, initial encounter: Secondary | ICD-10-CM | POA: Diagnosis not present

## 2022-07-23 MED ORDER — PREDNISONE 20 MG PO TABS
40.0000 mg | ORAL_TABLET | Freq: Once | ORAL | Status: AC
  Administered 2022-07-23: 40 mg via ORAL
  Filled 2022-07-23: qty 2

## 2022-07-23 MED ORDER — PREDNISONE 10 MG PO TABS: ORAL_TABLET | ORAL | 0 refills | Status: DC

## 2022-07-23 NOTE — ED Notes (Signed)
Pt provided discharge instructions and prescription information. Pt was given the opportunity to ask questions and questions were answered.   

## 2022-07-23 NOTE — ED Provider Notes (Signed)
[place] [facility] AT [facility] Provider Note   CSN: [id number] Arrival date & time: 07/23/22  2052     History  Chief Complaint  Patient presents with   Rash    Bianca Arroyo is a 72 y.o. female.   Rash Associated symptoms: no fever, no joint pain, no nausea, no shortness of breath, no sore throat and not vomiting         Bianca Arroyo is a 72 y.o. female with past medical history of type 2 diabetes, and hypertension who presents to the Emergency Department complaining of itching and rash there is scattered over her upper back, bilateral legs, bilateral arms and face.  Symptoms began last week.  She states that she was at the beach when she began noticing the rash and she had been out in the sand.  She denies using any new medications, detergents or lotions.  She has been taking over-the-counter Benadryl for her itching.  She denies any shortness of breath, swelling of her face, lips or tongue.    Home Medications Prior to Admission medications   Medication Sig Start Date End Date Taking? Authorizing Provider  albuterol (PROVENTIL HFA;VENTOLIN HFA) 108 (90 Base) MCG/ACT inhaler Inhale 2 puffs into the lungs every 6 (six) hours as needed for wheezing or shortness of breath. 08/16/17   Merlyn AlbertLuking, William S, MD  amLODipine (NORVASC) 5 MG tablet Take 1 tablet by mouth once daily 10/27/20   Laroy Appleaylor, Malena M, DO  aspirin 81 MG chewable tablet Chew 81 mg by mouth every morning.     [provider]  blood glucose meter kit and supplies Dispense based on patient and insurance preference. Use to test glucose once daily.  (FOR ICD -10 E11.9) 09/09/19   Merlyn AlbertLuking, William S, MD  carvedilol (COREG) 25 MG tablet TAKE 1 TABLET BY MOUTH TWICE DAILY WITH A MEAL 12/18/20   Ladona Ridgelaylor, Malena M, DO  fluticasone (FLONASE) 50 MCG/ACT nasal spray USE TWO SPRAY(S) IN EACH NOSTRIL ONCE DAILY 11/01/19   Laroy Appleaylor, Malena M, DO  fluticasone (FLOVENT HFA) 44 MCG/ACT inhaler Inhale  2 puffs by mouth twice daily 11/20/20   Ladona Ridgelaylor, Malena M, DO  glipiZIDE (GLUCOTROL) 5 MG tablet TAKE 1 TABLET BY MOUTH TWICE DAILY BEFORE MEAL(S) 06/07/22   Tommie Samsook, Jayce G, DO  losartan (COZAAR) 100 MG tablet Take 1 tablet by mouth once daily 09/22/20   Ladona Ridgelaylor, Malena M, DO  lovastatin (MEVACOR) 20 MG tablet Take 1 tablet (20 mg total) by mouth daily. APPT REQUIRED FOR FUTURE REFILLS 02/10/21   Campbell RichesHoskins, Carolyn C, NP  metFORMIN (GLUCOPHAGE) 500 MG tablet Take 1 tablet (500 mg total) by mouth 2 (two) times daily with a meal. Take by mouth daily with breakfast. 07/11/22   Tommie Samsook, Jayce G, DO  montelukast (SINGULAIR) 10 MG tablet Take 10 mg by mouth at bedtime.    [provider]  Multiple Vitamins-Minerals (ALIVE ONCE DAILY WOMENS 50+ PO) Take 1 tablet by mouth every morning.     [provider]  naproxen (NAPROSYN) 500 MG tablet Take 1 tablet (500 mg total) by mouth 2 (two) times daily with a meal. 11/10/20   Darreld McleanKeeling, Wayne, MD  spironolactone (ALDACTONE) 50 MG tablet Take 1 tablet by mouth once daily 09/22/20   Laroy Appleaylor, Malena M, DO  Tetrahydrozoline HCl (VISINE OP) Place 1 drop into both eyes daily as needed (dry eye relief).     [provider]  valACYclovir (VALTREX) 500 MG tablet TAKE 1 TABLET  BY MOUTH TWICE DAILY AS NEEDED FOR 3 DAYS FOR FLARES 08/16/19   [name], Malena M, DO  vitamin B-12 (CYANOCOBALAMIN) 1000 MCG tablet Take 1,000 mcg by mouth once a week.     [provider]      Allergies    Colchicine    Review of Systems   Review of Systems  Constitutional:  Negative for chills and fever.  HENT:  Negative for sore throat and trouble swallowing.   Respiratory:  Negative for shortness of breath.   Cardiovascular:  Negative for chest pain.  Gastrointestinal:  Negative for nausea and vomiting.  Genitourinary:  Negative for dysuria.  Musculoskeletal:  Negative for arthralgias.  Skin:  Positive for rash.  Neurological:  Negative for weakness and numbness.   Psychiatric/Behavioral:  Negative for confusion.     Physical Exam Updated Vital Signs BP (!) 169/91   Pulse 85   Temp 99 F (37.2 C) (Oral)   Resp 18   Ht 5\' 2"  (1.575 m)   Wt 66.7 kg   SpO2 100%   BMI 26.89 kg/m  Physical Exam Vitals and nursing note reviewed.  Constitutional:      General: She is not in acute distress.    Appearance: Normal appearance. She is not ill-appearing.  HENT:     Mouth/Throat:     Mouth: Mucous membranes are moist. No oral lesions or angioedema.     Pharynx: Oropharynx is clear. Uvula midline. No pharyngeal swelling, oropharyngeal exudate or posterior oropharyngeal erythema.  Cardiovascular:     Rate and Rhythm: Normal rate and regular rhythm.     Pulses: Normal pulses.  Pulmonary:     Effort: Pulmonary effort is normal. No respiratory distress.     Breath sounds: Normal breath sounds.  Musculoskeletal:        General: Normal range of motion.     Cervical back: Normal range of motion. No tenderness.  Skin:    General: Skin is warm.     Capillary Refill: Capillary refill takes less than 2 seconds.     Findings: Rash present.     Comments: Scattered pinpoint erythematous papules to the bilateral lower extremities, bilateral upper extremities, upper back and face.  No pustules, vesicles petechiae  Neurological:     General: No focal deficit present.     Mental Status: She is alert.     Sensory: No sensory deficit.     Motor: No weakness.     ED Results / Procedures / Treatments   Labs (all labs ordered are listed, but only abnormal results are displayed) Labs Reviewed - No data to display  EKG None  Radiology No results found.  Procedures Procedures    Medications Ordered in ED Medications  predniSONE (DELTASONE) tablet 40 mg (has no administration in time range)    ED Course/ Medical Decision Making/ A&P                             Medical Decision Making Patient here for evaluation of itching and rash for several  days.  Rash began while at the beach and exposed to the sand.  Rash has been spreading since onset.  Taking over-the-counter Benadryl with relief of itching.  Denies any difficulty swallowing, shortness of breath, swelling of her face lips or tongue.   On exam, patient well-appearing nontoxic.  Slightly hypertensive with history of same.  Differential would include but not limited to cellulitis, dermatitis, allergic reaction,  insect bites, zoster,.  I suspect rash is secondary to bites from sand fleas.  There is no edema of the face lips or tongue to suggest angioedema.  Do not suspect this is a contagious rash.  Amount and/or Complexity of Data Reviewed Discussion of management or test interpretation with external provider(s): Patient agreeable with treatment plan.  Prescription for steroid taper, will continue Benadryl, oatmeal bath if needed for itching  Risk Prescription drug management.           Final Clinical Impression(s) / ED Diagnoses Final diagnoses:  Insect bite, unspecified site, initial encounter    Rx / DC Orders ED Discharge Orders     None         Rosey Bath 07/23/22 2149    Cathren Laine, MD 07/23/22 2313

## 2022-07-23 NOTE — ED Triage Notes (Signed)
Pt states rash appeared on arm last Monday after being at the beach. It started as  3 bumps on right arm and has spread to left arm, legs and most recently to face. Pt denies any pain and states rash itches. No SOB. Pt denies changing any detergents or lotions. Has been taking benadryl at home for itching last taken at 6pm tonight.

## 2022-07-23 NOTE — Discharge Instructions (Signed)
Rash is most likely secondary to sand fleas.  You have been given steroid medication to help with your symptoms.  I recommend that you continue to take Benadryl, 1 capsule every 4-6 hours as needed for itching.  You may also use oatmeal bath to help soothe the skin and help with itching.  Monitor your blood sugar closely while taking the steroid as this can temporarily elevate your blood sugar.  If your sugar becomes too high, you will need to stop the prednisone.  Return to the emergency department for any new or worsening symptoms

## 2022-07-25 ENCOUNTER — Telehealth: Payer: Self-pay | Admitting: Family Medicine

## 2022-07-25 ENCOUNTER — Other Ambulatory Visit: Payer: Self-pay | Admitting: Family Medicine

## 2022-07-25 MED ORDER — SPIRONOLACTONE 50 MG PO TABS: 50.0000 mg | ORAL_TABLET | Freq: Every day | ORAL | 1 refills | Status: DC

## 2022-07-25 NOTE — Telephone Encounter (Signed)
New prescription for spironolactone (ALDACTONE) 50 MG tablet  sent to [facility] last prescribe by her doctor at [facility] in 2023. Please advise

## 2022-07-26 ENCOUNTER — Telehealth: Payer: Self-pay | Admitting: *Deleted

## 2022-07-26 NOTE — Telephone Encounter (Signed)
        Patient  visited [facility] on 07/23/2022  for treatment    Telephone encounter attempt :  1st  A HIPAA compliant voice message was left requesting a return call.  Instructed patient to call back at [phone].  Yehuda Mao Greenauer -[facility] [place], Population Health [phone] [address] [place] , [place] [address] Email : [email or web address]

## 2022-07-27 ENCOUNTER — Telehealth: Payer: Self-pay | Admitting: *Deleted

## 2022-07-27 NOTE — Telephone Encounter (Signed)
        Patient  visited [facility] on 07/23/2022  for treatment    Telephone encounter attempt :  2nd  A HIPAA compliant voice message was left requesting a return call.  Instructed patient to call back at [phone].  Bianca Arroyo -[facility] [employer], Population Health [phone] [address] [place] , [place] [address] Email : [email or web address]

## 2022-07-28 NOTE — Telephone Encounter (Signed)
Cook, Jayce G, DO     Rx sent.   

## 2022-08-20 ENCOUNTER — Other Ambulatory Visit: Payer: Self-pay | Admitting: Family Medicine

## 2022-08-23 ENCOUNTER — Other Ambulatory Visit: Payer: Self-pay

## 2022-08-23 ENCOUNTER — Other Ambulatory Visit (HOSPITAL_COMMUNITY): Payer: Self-pay

## 2022-08-23 MED ORDER — MONTELUKAST SODIUM 10 MG PO TABS
10.0000 mg | ORAL_TABLET | Freq: Every day | ORAL | 0 refills | Status: AC
  Filled 2022-08-23: qty 90, 90d supply, fill #0

## 2022-08-26 ENCOUNTER — Other Ambulatory Visit (HOSPITAL_COMMUNITY): Payer: Self-pay

## 2022-08-27 ENCOUNTER — Other Ambulatory Visit (HOSPITAL_COMMUNITY): Payer: Self-pay

## 2022-08-30 ENCOUNTER — Other Ambulatory Visit (HOSPITAL_COMMUNITY): Payer: Self-pay

## 2022-09-10 ENCOUNTER — Other Ambulatory Visit: Payer: Self-pay | Admitting: Family Medicine

## 2022-09-10 DIAGNOSIS — E119 Type 2 diabetes mellitus without complications: Secondary | ICD-10-CM

## 2022-09-13 ENCOUNTER — Other Ambulatory Visit: Payer: Self-pay

## 2022-09-13 DIAGNOSIS — E785 Hyperlipidemia, unspecified: Secondary | ICD-10-CM

## 2022-09-13 MED ORDER — LOVASTATIN 20 MG PO TABS: 20.0000 mg | ORAL_TABLET | Freq: Every day | ORAL | 0 refills | Status: AC

## 2022-09-29 ENCOUNTER — Ambulatory Visit: Payer: Medicare Other

## 2022-10-03 ENCOUNTER — Emergency Department (HOSPITAL_COMMUNITY): Payer: Medicare Other

## 2022-10-03 ENCOUNTER — Encounter (HOSPITAL_COMMUNITY): Payer: Self-pay

## 2022-10-03 ENCOUNTER — Emergency Department (HOSPITAL_COMMUNITY)
Admission: EM | Admit: 2022-10-03 | Discharge: 2022-10-03 | Disposition: A | Discharge status: Home / Self Care | Payer: Medicare Other | Attending: Emergency Medicine | Admitting: Emergency Medicine

## 2022-10-03 ENCOUNTER — Telehealth: Payer: Self-pay | Admitting: Family Medicine

## 2022-10-03 ENCOUNTER — Other Ambulatory Visit: Payer: Self-pay

## 2022-10-03 DIAGNOSIS — Z7984 Long term (current) use of oral hypoglycemic drugs: Secondary | ICD-10-CM | POA: Insufficient documentation

## 2022-10-03 DIAGNOSIS — I10 Essential (primary) hypertension: Secondary | ICD-10-CM | POA: Diagnosis not present

## 2022-10-03 DIAGNOSIS — J4521 Mild intermittent asthma with (acute) exacerbation: Secondary | ICD-10-CM | POA: Insufficient documentation

## 2022-10-03 DIAGNOSIS — E119 Type 2 diabetes mellitus without complications: Secondary | ICD-10-CM | POA: Insufficient documentation

## 2022-10-03 DIAGNOSIS — Z79899 Other long term (current) drug therapy: Secondary | ICD-10-CM | POA: Insufficient documentation

## 2022-10-03 DIAGNOSIS — R0602 Shortness of breath: Secondary | ICD-10-CM | POA: Diagnosis not present

## 2022-10-03 DIAGNOSIS — Z1152 Encounter for screening for COVID-19: Secondary | ICD-10-CM | POA: Diagnosis not present

## 2022-10-03 LAB — HEPATIC FUNCTION PANEL
ALT: 20 U/L (ref 0–44)
AST: 22 U/L (ref 15–41)
Albumin: 4.2 g/dL (ref 3.5–5.0)
Alkaline Phosphatase: 86 U/L (ref 38–126)
Bilirubin, Direct: 0.1 mg/dL (ref 0.0–0.2)
Indirect Bilirubin: 0.4 mg/dL (ref 0.3–0.9)
Total Bilirubin: 0.5 mg/dL (ref 0.3–1.2)
Total Protein: 8.1 g/dL (ref 6.5–8.1)

## 2022-10-03 LAB — BASIC METABOLIC PANEL
Anion gap: 10 (ref 5–15)
BUN: 17 mg/dL (ref 8–23)
CO2: 23 mmol/L (ref 22–32)
Calcium: 9.3 mg/dL (ref 8.9–10.3)
Chloride: 102 mmol/L (ref 98–111)
Creatinine, Ser: 0.95 mg/dL (ref 0.44–1.00)
GFR, Estimated: >60 mL/min (ref >60)
Glucose, Bld: 156 mg/dL — ABNORMAL HIGH (ref 70–99)
Potassium: 4.3 mmol/L (ref 3.5–5.1)
Sodium: 135 mmol/L (ref 135–145)

## 2022-10-03 LAB — CBC WITH DIFFERENTIAL/PLATELET
Abs Immature Granulocytes: 0.04 10*3/uL (ref 0.00–0.07)
Basophils Absolute: 0.1 10*3/uL (ref 0.0–0.1)
Basophils Relative: 1 %
Eosinophils Absolute: 1.4 10*3/uL — ABNORMAL HIGH (ref 0.0–0.5)
Eosinophils Relative: 15 %
HCT: 40.2 % (ref 36.0–46.0)
Hemoglobin: 13.3 g/dL (ref 12.0–15.0)
Immature Granulocytes: 1 %
Lymphocytes Relative: 13 %
Lymphs Abs: 1.1 10*3/uL (ref 0.7–4.0)
MCH: 31.2 pg (ref 26.0–34.0)
MCHC: 33.1 g/dL (ref 30.0–36.0)
MCV: 94.4 fL (ref 80.0–100.0)
Monocytes Absolute: 1.6 10*3/uL — ABNORMAL HIGH (ref 0.1–1.0)
Monocytes Relative: 19 %
Neutro Abs: 4.6 10*3/uL (ref 1.7–7.7)
Neutrophils Relative %: 51 %
Platelets: 307 10*3/uL (ref 150–400)
RBC: 4.26 MIL/uL (ref 3.87–5.11)
RDW: 12.8 % (ref 11.5–15.5)
WBC: 8.9 10*3/uL (ref 4.0–10.5)
nRBC: 0 % (ref 0.0–0.2)

## 2022-10-03 LAB — RESP PANEL BY RT-PCR (RSV, FLU A&B, COVID)  RVPGX2
Influenza A by PCR: NEGATIVE
Influenza B by PCR: NEGATIVE
Resp Syncytial Virus by PCR: NEGATIVE
SARS Coronavirus 2 by RT PCR: NEGATIVE

## 2022-10-03 LAB — TROPONIN I (HIGH SENSITIVITY)
Troponin I (High Sensitivity): 3 ng/L (ref <18)
Troponin I (High Sensitivity): 3 ng/L (ref <18)

## 2022-10-03 LAB — BRAIN NATRIURETIC PEPTIDE: B Natriuretic Peptide: 37 pg/mL (ref 0.0–100.0)

## 2022-10-03 LAB — MAGNESIUM: Magnesium: 1.7 mg/dL (ref 1.7–2.4)

## 2022-10-03 MED ORDER — LOSARTAN POTASSIUM 100 MG PO TABS: 100.0000 mg | ORAL_TABLET | Freq: Every day | ORAL | 2 refills | Status: DC

## 2022-10-03 MED ORDER — ALBUTEROL SULFATE HFA 108 (90 BASE) MCG/ACT IN AERS
2.0000 | INHALATION_SPRAY | Freq: Once | RESPIRATORY_TRACT | Status: AC
  Administered 2022-10-03: 2 via RESPIRATORY_TRACT
  Filled 2022-10-03: qty 6.7

## 2022-10-03 MED ORDER — CARVEDILOL 12.5 MG PO TABS
25.0000 mg | ORAL_TABLET | Freq: Two times a day (BID) | ORAL | Status: DC
  Administered 2022-10-03: 25 mg via ORAL
  Filled 2022-10-03: qty 2

## 2022-10-03 MED ORDER — ALBUTEROL SULFATE (2.5 MG/3ML) 0.083% IN NEBU
10.0000 mg/h | INHALATION_SOLUTION | Freq: Once | RESPIRATORY_TRACT | Status: AC
  Filled 2022-10-03: qty 15

## 2022-10-03 MED ORDER — ALBUTEROL SULFATE (2.5 MG/3ML) 0.083% IN NEBU
INHALATION_SOLUTION | RESPIRATORY_TRACT | Status: AC
  Administered 2022-10-03: 10 mg/h via RESPIRATORY_TRACT
  Filled 2022-10-03: qty 3

## 2022-10-03 MED ORDER — AMLODIPINE BESYLATE 5 MG PO TABS
5.0000 mg | ORAL_TABLET | Freq: Every day | ORAL | Status: DC
  Administered 2022-10-03: 5 mg via ORAL
  Filled 2022-10-03: qty 1

## 2022-10-03 MED ORDER — METHYLPREDNISOLONE SODIUM SUCC 125 MG IJ SOLR
125.0000 mg | Freq: Once | INTRAMUSCULAR | Status: AC
  Administered 2022-10-03: 125 mg via INTRAVENOUS
  Filled 2022-10-03: qty 2

## 2022-10-03 MED ORDER — ALBUTEROL SULFATE (2.5 MG/3ML) 0.083% IN NEBU
2.5000 mg | INHALATION_SOLUTION | Freq: Once | RESPIRATORY_TRACT | Status: AC
  Administered 2022-10-03: 2.5 mg via RESPIRATORY_TRACT

## 2022-10-03 MED ORDER — ALBUTEROL SULFATE HFA 108 (90 BASE) MCG/ACT IN AERS: 2.0000 | INHALATION_SPRAY | Freq: Four times a day (QID) | RESPIRATORY_TRACT | 6 refills | Status: DC | PRN

## 2022-10-03 MED ORDER — PREDNISONE 10 MG PO TABS: 40.0000 mg | ORAL_TABLET | Freq: Every day | ORAL | 0 refills | Status: AC

## 2022-10-03 MED ORDER — NITROGLYCERIN 2 % TD OINT
1.0000 [in_us] | TOPICAL_OINTMENT | Freq: Once | TRANSDERMAL | Status: AC
  Administered 2022-10-03: 1 [in_us] via TOPICAL
  Filled 2022-10-03: qty 1

## 2022-10-03 NOTE — Telephone Encounter (Signed)
Refill on    losartan (COZAAR) 100 MG tabletsend to [facility]   

## 2022-10-03 NOTE — ED Notes (Signed)
Wheezing bilaterally. SPO2 maintained upper 90's on RA. Denies pain. Kellogg RN

## 2022-10-03 NOTE — Discharge Instructions (Signed)
A prescription for a steroid medication was sent to your pharmacy.  Take this as prescribed for the next 4 days, starting tomorrow.  Refills for your albuterol inhaler were also sent.  Use these as needed for shortness of breath or cough.  Return to the emergency department for any worsening of symptoms.

## 2022-10-03 NOTE — ED Provider Notes (Signed)
[place] EMERGENCY DEPARTMENT AT [facility] Provider Note   CSN: [id number] Arrival date & time: 10/03/22  0865     History  Chief Complaint  Patient presents with   Shortness of Breath    Bianca Arroyo is a 72 y.o. female.   Shortness of Breath Associated symptoms: cough   Patient presents with shortness of breath.  Medical history includes asthma, DM, HTN, HLD, arthritis, gout.  She developed URI symptoms 2 days ago.  She had an episode of shortness of breath yesterday that resolved.  She had poor sleep due to her ongoing shortness of breath.  This morning, symptoms became more severe.  She does not use nebulizers at home.  She does not smoke.  She will occasionally use an albuterol inhaler for her asthma.  She denies any areas of pain.     Home Medications Prior to Admission medications   Medication Sig Start Date End Date Taking? Authorizing Provider  predniSONE (DELTASONE) 10 MG tablet Take 4 tablets (40 mg total) by mouth daily for 4 days. 10/03/22 10/07/22 Yes [name], MD  albuterol (VENTOLIN HFA) 108 (90 Base) MCG/ACT inhaler Inhale 2 puffs into the lungs every 6 (six) hours as needed for wheezing or shortness of breath. 10/03/22   [name], MD  amLODipine (NORVASC) 5 MG tablet Take 1 tablet by mouth once daily 10/27/20   [name] M, DO  aspirin 81 MG chewable tablet Chew 81 mg by mouth every morning.     [provider]  blood glucose meter kit and supplies Dispense based on patient and insurance preference. Use to test glucose once daily.  (FOR ICD -10 E11.9) 09/09/19   [name], MD  carvedilol (COREG) 25 MG tablet TAKE 1 TABLET BY MOUTH TWICE DAILY WITH A MEAL 12/18/20   [name], Malena M, DO  fluticasone (FLONASE) 50 MCG/ACT nasal spray USE TWO SPRAY(S) IN EACH NOSTRIL ONCE DAILY 11/01/19   [name] M, DO  fluticasone (FLOVENT HFA) 44 MCG/ACT inhaler Inhale 2 puffs by mouth twice daily 11/20/20   [name], Malena M, DO   glipiZIDE (GLUCOTROL) 5 MG tablet TAKE 1 TABLET BY MOUTH TWICE DAILY BEFORE MEAL(S) 09/14/22   [name], DO  losartan (COZAAR) 100 MG tablet Take 1 tablet by mouth once daily 09/22/20   [name], Malena M, DO  lovastatin (MEVACOR) 20 MG tablet Take 1 tablet (20 mg total) by mouth daily. 09/13/22   [name], DO  metFORMIN (GLUCOPHAGE) 500 MG tablet Take 1 tablet (500 mg total) by mouth 2 (two) times daily with a meal. Take by mouth daily with breakfast. 07/11/22   [name], DO  montelukast (SINGULAIR) 10 MG tablet Take 1 tablet (10 mg total) by mouth at bedtime. 08/23/22   [name], DO  Multiple Vitamins-Minerals (ALIVE ONCE DAILY WOMENS 50+ PO) Take 1 tablet by mouth every morning.     [provider]  naproxen (NAPROSYN) 500 MG tablet Take 1 tablet (500 mg total) by mouth 2 (two) times daily with a meal. 11/10/20   [name], MD  spironolactone (ALDACTONE) 50 MG tablet Take 1 tablet (50 mg total) by mouth daily. 07/25/22   [name], DO  Tetrahydrozoline HCl (VISINE OP) Place 1 drop into both eyes daily as needed (dry eye relief).     [provider]  valACYclovir (VALTREX) 500 MG tablet TAKE 1 TABLET BY MOUTH TWICE DAILY AS NEEDED FOR 3 DAYS FOR FLARES 08/16/19  [name], Malena M, DO  vitamin B-12 (CYANOCOBALAMIN) 1000 MCG tablet Take 1,000 mcg by mouth once a week.     [provider]      Allergies    Colchicine    Review of Systems   Review of Systems  Respiratory:  Positive for cough, chest tightness and shortness of breath.   All other systems reviewed and are negative.   Physical Exam Updated Vital Signs BP 127/74   Pulse 79   Temp 97.7 F (36.5 C) (Oral)   Resp 20   Ht 5\' 2"  (1.575 m)   Wt 68 kg   SpO2 95%   BMI 27.44 kg/m  Physical Exam Vitals and nursing note reviewed.  Constitutional:      General: She is not in acute distress.    Appearance: She is well-developed. She is ill-appearing. She is not toxic-appearing or  diaphoretic.  HENT:     Head: Normocephalic and atraumatic.     Mouth/Throat:     Mouth: Mucous membranes are moist.     Pharynx: Oropharynx is clear.  Eyes:     Conjunctiva/sclera: Conjunctivae normal.  Cardiovascular:     Rate and Rhythm: Normal rate and regular rhythm.     Heart sounds: No murmur heard. Pulmonary:     Effort: Tachypnea, accessory muscle usage and respiratory distress present.     Breath sounds: Wheezing and rhonchi present.  Chest:     Chest wall: No tenderness.  Abdominal:     Palpations: Abdomen is soft.     Tenderness: There is no abdominal tenderness.  Musculoskeletal:        General: No swelling. Normal range of motion.     Cervical back: Normal range of motion and neck supple.     Right lower leg: No edema.     Left lower leg: No edema.  Skin:    General: Skin is warm and dry.     Coloration: Skin is not cyanotic or pale.  Neurological:     General: No focal deficit present.     Mental Status: She is alert and oriented to person, place, and time.  Psychiatric:        Mood and Affect: Mood normal.        Behavior: Behavior normal.     ED Results / Procedures / Treatments   Labs (all labs ordered are listed, but only abnormal results are displayed) Labs Reviewed  BASIC METABOLIC PANEL - Abnormal; Notable for the following components:      Result Value   Glucose, Bld 156 (*)    All other components within normal limits  CBC WITH DIFFERENTIAL/PLATELET - Abnormal; Notable for the following components:   Monocytes Absolute 1.6 (*)    Eosinophils Absolute 1.4 (*)    All other components within normal limits  RESP PANEL BY RT-PCR (RSV, FLU A&B, COVID)  [id number]  BRAIN NATRIURETIC PEPTIDE  MAGNESIUM  HEPATIC FUNCTION PANEL  TROPONIN I (HIGH SENSITIVITY)  TROPONIN I (HIGH SENSITIVITY)    EKG EKG Interpretation  Date/Time:  Monday October 03 2022 06:42:59 EDT Ventricular Rate:  82 PR Interval:  177 QRS Duration: 98 QT Interval:  447 QTC  Calculation: 523 R Axis:   62 Text Interpretation: Sinus rhythm Probable LVH with secondary repol abnrm Anterior Q waves, possibly due to LVH Abnormal T, probable ischemia, lateral leads Prolonged QT interval No significant change since last tracing in 2022 available in ECG tab in Epic Confirmed by Susy Frizzle [phone]) on 10/03/2022  6:46:20 AM  Radiology DG Chest Port 1 View  Result Date: 10/03/2022 CLINICAL DATA:  10026 Shortness of breath 10026 EXAM: PORTABLE CHEST 1 VIEW COMPARISON:  CXR 10/28/14 FINDINGS: No pleural effusion. No pneumothorax. No focal airspace opacity. Normal cardiac and mediastinal contours. No radiographically apparent displaced rib fractures. Visualized upper abdomen is unremarkable. IMPRESSION: No acute pulmonary abnormality Electronically Signed   By: Lorenza Cambridge M.D.   On: 10/03/2022 08:49    Procedures Procedures    Medications Ordered in ED Medications  amLODipine (NORVASC) tablet 5 mg (5 mg Oral Given 10/03/22 0726)  carvedilol (COREG) tablet 25 mg (25 mg Oral Given 10/03/22 0726)  albuterol (VENTOLIN HFA) 108 (90 Base) MCG/ACT inhaler 2 puff (has no administration in time range)  methylPREDNISolone sodium succinate (SOLU-MEDROL) 125 mg/2 mL injection 125 mg (125 mg Intravenous Given 10/03/22 0727)  albuterol (PROVENTIL) (2.5 MG/3ML) 0.083% nebulizer solution (10 mg/hr Nebulization Given 10/03/22 0726)  nitroGLYCERIN (NITROGLYN) 2 % ointment 1 inch (1 inch Topical Given 10/03/22 0727)  albuterol (PROVENTIL) (2.5 MG/3ML) 0.083% nebulizer solution 2.5 mg (2.5 mg Nebulization Given 10/03/22 1610)    ED Course/ Medical Decision Making/ A&P                             Medical Decision Making Amount and/or Complexity of Data Reviewed Labs: ordered. Radiology: ordered.  Risk Prescription drug management.   This patient presents to the ED for concern of shortness of breath, this involves an extensive number of treatment options, and is a complaint that carries  with it a high risk of complications and morbidity.  The differential diagnosis includes asthma exacerbation, allergic reaction, pneumonia, CHF, ACS   Co morbidities that complicate the patient evaluation  asthma, DM, HTN, HLD, arthritis, gout   Additional history obtained:  Additional history obtained from N/A External records from outside source obtained and reviewed including EMR   Lab Tests:  I Ordered, and personally interpreted labs.  The pertinent results include: Normal hemoglobin, no leukocytosis, normal kidney function, normal electrolytes, normal BNP, normal troponin   Imaging Studies ordered:  I ordered imaging studies including chest x-ray I independently visualized and interpreted imaging which showed no acute findings I agree with the radiologist interpretation   Cardiac Monitoring: / EKG:  The patient was maintained on a cardiac monitor.  I personally viewed and interpreted the cardiac monitored which showed an underlying rhythm of: Sinus rhythm   Problem List / ED Course / Critical interventions / Medication management  Patient presenting for shortness of breath.  Vital signs on arrival notable for tachypnea and hypertension.  She has not taken her hypertensive medications today.  She endorses shortness of breath, throughout the night but worsened this morning.  On initial assessment, patient had increased work of breathing, diffuse wheezing.  She was unable to speak in complete sentences.  Solu-Medrol and albuterol were ordered.  Workup was initiated.  Initially BiPAP was ordered.  Prior to it starting on BiPAP, patient had improved symptoms following nebulized breathing treatment.  Will hold off on BiPAP for now.  Patient got continuous nebulized albuterol.  Blood pressure improved after home doses of blood pressure medications.  Shortness of breath resolved.  On reassessment, she has no persistent wheezing on lung auscultation.  Following sustained resolution of  her symptoms, patient was discharged in stable condition. I ordered medication including Solu-Medrol, albuterol for asthma exacerbation; NTG, amlodipine, Coreg for hypertension Reevaluation of the patient after  these medicines showed that the patient resolved I have reviewed the patients home medicines and have made adjustments as needed   Social Determinants of Health:  Has PCP  CRITICAL CARE Performed by: [name]   Total critical care time: 32 minutes  Critical care time was exclusive of separately billable procedures and treating other patients.  Critical care was necessary to treat or prevent imminent or life-threatening deterioration.  Critical care was time spent personally by me on the following activities: development of treatment plan with patient and/or surrogate as well as nursing, discussions with consultants, evaluation of patient's response to treatment, examination of patient, obtaining history from patient or surrogate, ordering and performing treatments and interventions, ordering and review of laboratory studies, ordering and review of radiographic studies, pulse oximetry and re-evaluation of patient's condition.        Final Clinical Impression(s) / ED Diagnoses Final diagnoses:  Mild intermittent asthma with exacerbation    Rx / DC Orders ED Discharge Orders          Ordered    predniSONE (DELTASONE) 10 MG tablet  Daily        10/03/22 1036    albuterol (VENTOLIN HFA) 108 (90 Base) MCG/ACT inhaler  Every 6 hours PRN        10/03/22 1036              [name], MD 10/03/22 1037

## 2022-10-03 NOTE — ED Triage Notes (Signed)
Pt c/o SOB x1 day. Hx of asthma. States she used inhaler with some relief.  Pt speaking in full sentences in triage.

## 2022-10-06 ENCOUNTER — Telehealth: Payer: Self-pay

## 2022-10-06 NOTE — Transitions of Care (Post Inpatient/ED Visit) (Signed)
   10/06/2022  Name: Bianca Arroyo MRN: [id number] DOB: 1951-01-28  Today's TOC FU Call Status: Today's TOC FU Call Status:: Unsuccessul Call (1st Attempt) Unsuccessful Call (1st Attempt) Date: 10/06/22  Attempted to reach the patient regarding the most recent Inpatient/ED visit.  Follow Up Plan: Additional outreach attempts will be made to reach the patient to complete the Transitions of Care (Post Inpatient/ED visit) call.   [name], RN, BSN, CCM Care Management Coordinator [place]

## 2022-10-07 ENCOUNTER — Telehealth: Payer: Self-pay

## 2022-10-07 NOTE — Transitions of Care (Post Inpatient/ED Visit) (Signed)
10/07/2022  Name: Bianca Arroyo MRN: [id number] DOB: 1951/01/16  Today's TOC FU Call Status: EMMI Today's TOC FU Call Status:: Successful TOC FU Call Competed TOC FU Call Complete Date: 10/07/22  Transition Care Management Follow-up Telephone Call Date of Discharge: 10/03/22 [facility] Type of Discharge: Emergency Department Reason for ED Visit: Respiratory Respiratory Diagnosis:  (Mild Asthma) How have you been since you were released from the hospital?: Better Any questions or concerns?: No  Items Reviewed: Did you receive and understand the discharge instructions provided?: Yes Medications obtained,verified, and reconciled?: Partial Review Completed Reason for Partial Mediation Review: Discussed ED medications Any new allergies since your discharge?: No Dietary orders reviewed?: No Do you have support at home?: Yes People in Home: child(ren), adult Name of Support/Comfort Primary Source: Bianca Arroyo  Medications Reviewed Today: Medications Reviewed Today     Reviewed by Bianca Gross, RN (Case Manager) on 10/07/22 at 1601  Med List Status: <None>   Medication Order Taking? Sig Documenting Provider Last Dose Status Informant  albuterol (VENTOLIN HFA) 108 (90 Base) MCG/ACT inhaler [id number] Yes Inhale 2 puffs into the lungs every 6 (six) hours as needed for wheezing or shortness of breath. Bianca Manchester, MD Taking Active   amLODipine [facility]) 5 MG tablet [id number]  Take 1 tablet by mouth once daily Annalee Genta, DO  Active   aspirin 81 MG chewable tablet [id number]  Chew 81 mg by mouth every morning.  [provider]  Active Self  blood glucose meter kit and supplies [id number]  Dispense based on patient and insurance preference. Use to test glucose once daily.  (FOR ICD -10 E11.9) Bianca Albert, MD  Active   carvedilol (COREG) 25 MG tablet [id number]  TAKE 1 TABLET BY MOUTH TWICE DAILY WITH A MEAL Bianca Arroyo, Malena M, DO  Active    fluticasone (FLONASE) 50 MCG/ACT nasal spray [id number] Yes USE TWO SPRAY(S) IN EACH NOSTRIL ONCE DAILY Bianca Apple M, DO Taking Active   fluticasone (FLOVENT HFA) 44 MCG/ACT inhaler [id number] Yes Inhale 2 puffs by mouth twice daily Bianca Apple M, DO Taking Active   glipiZIDE (GLUCOTROL) 5 MG tablet [id number]  TAKE 1 TABLET BY MOUTH TWICE DAILY BEFORE MEAL(S) Bianca Arroyo, Jayce G, DO  Active   losartan (COZAAR) 100 MG tablet [id number]  Take 1 tablet (100 mg total) by mouth daily. Bianca Other G, DO  Active   lovastatin (MEVACOR) 20 MG tablet [id number]  Take 1 tablet (20 mg total) by mouth daily. Bianca Other G, DO  Active   metFORMIN (GLUCOPHAGE) 500 MG tablet [id number]  Take 1 tablet (500 mg total) by mouth 2 (two) times daily with a meal. Take by mouth daily with breakfast. Bianca Sams, DO  Active   montelukast (SINGULAIR) 10 MG tablet [id number]  Take 1 tablet (10 mg total) by mouth at bedtime. Bianca Sams, DO  Active   Multiple Vitamins-Minerals (ALIVE ONCE DAILY WOMENS 50+ PO) [id number]  Take 1 tablet by mouth every morning.  [provider]  Active Self  naproxen (NAPROSYN) 500 MG tablet [id number]  Take 1 tablet (500 mg total) by mouth 2 (two) times daily with a meal. Bianca Mclean, MD  Active   nitroGLYCERIN (NITRODUR - Dosed in mg/24 hr) patch 0.3 mg [id number]   Bianca Arroyo, DPM  Active   predniSONE (DELTASONE) 10 MG tablet [id number] Yes Take 4 tablets (40 mg total) by mouth daily for 4 days. Bianca Manchester, MD Taking Active   spironolactone (  ALDACTONE) 50 MG tablet [id number]  Take 1 tablet (50 mg total) by mouth daily. Bianca Sams, DO  Active   Tetrahydrozoline HCl (VISINE OP) [id number]  Place 1 drop into both eyes daily as needed (dry eye relief).  [provider]  Active Self  valACYclovir (VALTREX) 500 MG tablet [id number]  TAKE 1 TABLET BY MOUTH TWICE DAILY AS NEEDED FOR 3 DAYS FOR FLARES Bianca Arroyo, Malena M, DO  Active   vitamin B-12 (CYANOCOBALAMIN) 1000 MCG tablet  [id number]  Take 1,000 mcg by mouth once a week.  [provider]  Active Self            Home Care and Equipment/Supplies: Were Home Health Services Ordered?: No Any new equipment or medical supplies ordered?: No  Functional Questionnaire: Do you need assistance with bathing/showering or dressing?: No Do you need assistance with meal preparation?: No Do you need assistance with eating?: No Do you have difficulty maintaining continence: No Do you need assistance with getting out of bed/getting out of a chair/moving?: No Do you have difficulty managing or taking your medications?: No  Follow up appointments reviewed: PCP Follow-up appointment confirmed?: No (Patient to call next week to get appt) MD Provider Line [phone] Given: No Specialist Hospital Follow-up appointment confirmed?: NA Do you need transportation to your follow-up appointment?: No Do you understand care options if your condition(s) worsen?: Yes-patient verbalized understanding  SDOH Interventions Today    Flowsheet Row Most Recent Value  SDOH Interventions   Food Insecurity Interventions Intervention Not Indicated  Housing Interventions Intervention Not Indicated  Transportation Interventions Intervention Not Indicated  Financial Strain Interventions Intervention Not Indicated      Bianca Gross, RN, BSN, CCM Care Management Coordinator [place] Phone: [phone]/Fax: [phone]

## 2022-10-10 ENCOUNTER — Ambulatory Visit: Payer: Medicare Other | Admitting: Podiatry

## 2022-10-11 ENCOUNTER — Ambulatory Visit (INDEPENDENT_AMBULATORY_CARE_PROVIDER_SITE_OTHER): Payer: Medicare Other

## 2022-10-11 ENCOUNTER — Ambulatory Visit (INDEPENDENT_AMBULATORY_CARE_PROVIDER_SITE_OTHER): Payer: Medicare Other | Admitting: Podiatry

## 2022-10-11 DIAGNOSIS — E119 Type 2 diabetes mellitus without complications: Secondary | ICD-10-CM | POA: Diagnosis not present

## 2022-10-11 DIAGNOSIS — M19079 Primary osteoarthritis, unspecified ankle and foot: Secondary | ICD-10-CM

## 2022-10-11 DIAGNOSIS — M19071 Primary osteoarthritis, right ankle and foot: Secondary | ICD-10-CM | POA: Diagnosis not present

## 2022-10-11 DIAGNOSIS — B351 Tinea unguium: Secondary | ICD-10-CM | POA: Diagnosis not present

## 2022-10-11 DIAGNOSIS — M79671 Pain in right foot: Secondary | ICD-10-CM

## 2022-10-11 MED ORDER — CICLOPIROX 8 % EX SOLN: Freq: Every day | CUTANEOUS | 2 refills | Status: AC

## 2022-10-11 NOTE — Progress Notes (Signed)
Subjective:   Patient ID: Bianca Arroyo, female   DOB: 72 y.o.   MRN: [id number]   HPI Chief Complaint  Patient presents with   Nail Problem    Diabetic foot care / patient states left great toe has been bothering her states she has a history of gout   72 year old female presents the office for above concerns.  She states that she been having some discomfort to her RIGHT big toe she is not sure if she has a gout.  No recent injuries that she reports.  Her last A1c was 6.4 in May 13, 2022 her last blood sugar she checked was 131.    No open lesions, no claudication symptoms.   Review of Systems  All other systems reviewed and are negative.   Past Medical History:  Diagnosis Date   Allergy 1965   seasonal, colcrys (~2014)   Arthritis 1990   Asthma 2016   H/O echocardiogram    a. 05/2017: echo showing EF of 65-70%, no regional WMA, moderate LVH, and mild MR.    Hyperlipidemia    Hypertension    Type 2 diabetes mellitus (HCC)     Past Surgical History:  Procedure Laterality Date   BREAST BIOPSY Left    benign   CESAREAN SECTION     COLONOSCOPY     COLONOSCOPY N/A 12/06/2013   Dr. Darrick Penna: Left colon redundant, diverticulosis, 2 polyps removed, one tubular adenoma the other hyperplastic.  Consider colonoscopy in 5 to 10 years with overtube   COLONOSCOPY WITH PROPOFOL N/A 06/11/2019   Procedure: COLONOSCOPY WITH PROPOFOL;  Surgeon: West Bali, MD;  Location: [facility] ENDO SUITE;  Service: Endoscopy;  Laterality: N/A;  10:30am   EYE SURGERY  ~1995   [facility]   FRACTURE SURGERY  2003 and 2012   left wrist and right thumb   Left wrist surgery     POLYPECTOMY  06/11/2019   Procedure: POLYPECTOMY;  Surgeon: West Bali, MD;  Location: [facility] ENDO SUITE;  Service: Endoscopy;;   Right trigger thumb  2013   tummy tuck       Current Outpatient Medications:    ciclopirox (PENLAC) 8 % solution, Apply topically at bedtime. Apply over nail and surrounding skin. Apply daily  over previous coat. After seven (7) days, may remove with alcohol and continue cycle., Disp: 6.6 mL, Rfl: 2   albuterol (VENTOLIN HFA) 108 (90 Base) MCG/ACT inhaler, Inhale 2 puffs into the lungs every 6 (six) hours as needed for wheezing or shortness of breath., Disp: 1 each, Rfl: 6   amLODipine (NORVASC) 5 MG tablet, Take 1 tablet by mouth once daily, Disp: 90 tablet, Rfl: 0   aspirin 81 MG chewable tablet, Chew 81 mg by mouth every morning. , Disp: , Rfl:    blood glucose meter kit and supplies, Dispense based on patient and insurance preference. Use to test glucose once daily.  (FOR ICD -10 E11.9), Disp: 1 each, Rfl: 5   carvedilol (COREG) 25 MG tablet, TAKE 1 TABLET BY MOUTH TWICE DAILY WITH A MEAL, Disp: 60 tablet, Rfl: 0   fluticasone (FLONASE) 50 MCG/ACT nasal spray, USE TWO SPRAY(S) IN EACH NOSTRIL ONCE DAILY, Disp: 16 g, Rfl: 1   fluticasone (FLOVENT HFA) 44 MCG/ACT inhaler, Inhale 2 puffs by mouth twice daily, Disp: 11 g, Rfl: 0   glipiZIDE (GLUCOTROL) 5 MG tablet, TAKE 1 TABLET BY MOUTH TWICE DAILY BEFORE MEAL(S), Disp: 180 tablet, Rfl: 0   losartan (COZAAR) 100 MG tablet, Take  1 tablet (100 mg total) by mouth daily., Disp: 90 tablet, Rfl: 2   lovastatin (MEVACOR) 20 MG tablet, Take 1 tablet (20 mg total) by mouth daily., Disp: 90 tablet, Rfl: 0   metFORMIN (GLUCOPHAGE) 500 MG tablet, Take 1 tablet (500 mg total) by mouth 2 (two) times daily with a meal. Take by mouth daily with breakfast., Disp: 180 tablet, Rfl: 3   montelukast (SINGULAIR) 10 MG tablet, Take 1 tablet (10 mg total) by mouth at bedtime., Disp: 90 tablet, Rfl: 0   Multiple Vitamins-Minerals (ALIVE ONCE DAILY WOMENS 50+ PO), Take 1 tablet by mouth every morning. , Disp: , Rfl:    naproxen (NAPROSYN) 500 MG tablet, Take 1 tablet (500 mg total) by mouth 2 (two) times daily with a meal., Disp: 60 tablet, Rfl: 5   spironolactone (ALDACTONE) 50 MG tablet, Take 1 tablet (50 mg total) by mouth daily., Disp: 90 tablet, Rfl: 1    Tetrahydrozoline HCl (VISINE OP), Place 1 drop into both eyes daily as needed (dry eye relief). , Disp: , Rfl:    valACYclovir (VALTREX) 500 MG tablet, TAKE 1 TABLET BY MOUTH TWICE DAILY AS NEEDED FOR 3 DAYS FOR FLARES, Disp: 6 tablet, Rfl: 0   vitamin B-12 (CYANOCOBALAMIN) 1000 MCG tablet, Take 1,000 mcg by mouth once a week. , Disp: , Rfl:   Current Facility-Administered Medications:    nitroGLYCERIN (NITRODUR - Dosed in mg/24 hr) patch 0.3 mg, 0.3 mg, Transdermal, Daily, Regal, Kirstie Peri, DPM  Allergies  Allergen Reactions   Colchicine Diarrhea    GI trouble         Objective:  Physical Exam  General: AAO x3, NAD  Dermatological: Nails are mildly hypertrophic, dystrophic with subungual debris or yellow, brown discoloration.  No edema, erythema or signs of infection.  No open lesions.   Vascular: Dorsalis Pedis artery and Posterior Tibial artery pedal pulses are 2/4 bilateral with immedate capillary fill time. There is no pain with calf compression, swelling, warmth, erythema.   Neruologic: Grossly intact via light touch bilateral.  Sensation intact with Semmes Weinstein monofilament.  Musculoskeletal: Arthritic changes present noted along the RIGHT hallux IPJ.  Some trace edema is present.  There is no erythema or warmth.  No significant pain on exam today.  Muscular strength 5/5 in all groups tested bilateral.  Gait: Unassisted, Nonantalgic.        Assessment:   72 year old female with onychomycosis, diabetic foot exam, arthritis     Plan:  -Treatment options discussed including all alternatives, risks, and complications -Etiology of symptoms were discussed -X-rays were obtained and reviewed with the patient.  3 views the right foot were obtained.  No evidence of acute fracture.  Arthritic changes present to the IPJ as well as the MPJ. -Regards to the toe it does appear to be arthritis.  There is no active signs of gout today otherwise we will continue to monitor for  this.  Discussed surgery versus conservative treatment.  Will continue with conservative care.  Discussed the modifications so topical cream such as Voltaren gel that she can apply. -Debrided nails x 10 without any complications or bleeding.  Discussed treatment options for nail fungus.  Prescribed Penlac. -Daily foot inspection and glucose control.   Return in about 1 year (around 10/11/2023) for diabetic foot exam.  Vivi Barrack DPM

## 2022-10-11 NOTE — Patient Instructions (Signed)
If you don't get the penlac for nail fungus you can try FUNGI-NAIL  ---  If was nice to meet you today. If you have any questions or any further concerns, please feel fee to give me a call. You can call our office at [phone] or please feel fee to send me a message through MyChart.   --  Diabetes Mellitus and Foot Care Diabetes, also called diabetes mellitus, may cause problems with your feet and legs because of poor blood flow (circulation). Poor circulation may make your skin: Become thinner and drier. Break more easily. Heal more slowly. Peel and crack. You may also have nerve damage (neuropathy). This can cause decreased feeling in your legs and feet. This means that you may not notice minor injuries to your feet that could lead to more serious problems. Finding and treating problems early is the best way to prevent future foot problems. How to care for your feet Foot hygiene  Wash your feet daily with warm water and mild soap. Do not use hot water. Then, pat your feet and the areas between your toes until they are fully dry. Do not soak your feet. This can dry your skin. Trim your toenails straight across. Do not dig under them or around the cuticle. File the edges of your nails with an emery board or nail file. Apply a moisturizing lotion or petroleum jelly to the skin on your feet and to dry, brittle toenails. Use lotion that does not contain alcohol and is unscented. Do not apply lotion between your toes. Shoes and socks Wear clean socks or stockings every day. Make sure they are not too tight. Do not wear knee-high stockings. These may decrease blood flow to your legs. Wear shoes that fit well and have enough cushioning. Always look in your shoes before you put them on to be sure there are no objects inside. To break in new shoes, wear them for just a few hours a day. This prevents injuries on your feet. Wounds, scrapes, corns, and calluses  Check your feet daily for blisters,  cuts, bruises, sores, and redness. If you cannot see the bottom of your feet, use a mirror or ask someone for help. Do not cut off corns or calluses or try to remove them with medicine. If you find a minor scrape, cut, or break in the skin on your feet, keep it and the skin around it clean and dry. You may clean these areas with mild soap and water. Do not clean the area with peroxide, alcohol, or iodine. If you have a wound, scrape, corn, or callus on your foot, look at it several times a day to make sure it is healing and not infected. Check for: Redness, swelling, or pain. Fluid or blood. Warmth. Pus or a bad smell. General tips Do not cross your legs. This may decrease blood flow to your feet. Do not use heating pads or hot water bottles on your feet. They may burn your skin. If you have lost feeling in your feet or legs, you may not know this is happening until it is too late. Protect your feet from hot and cold by wearing shoes, such as at the beach or on hot pavement. Schedule a complete foot exam at least once a year or more often if you have foot problems. Report any cuts, sores, or bruises to your health care provider right away. Where to find more information American Diabetes Association: diabetes.org Association of Diabetes Care & Education Specialists:  diabeteseducator.org Contact a health care provider if: You have a condition that increases your risk of infection, and you have any cuts, sores, or bruises on your feet. You have an injury that is not healing. You have redness on your legs or feet. You feel burning or tingling in your legs or feet. You have pain or cramps in your legs and feet. Your legs or feet are numb. Your feet always feel cold. You have pain around any toenails. Get help right away if: You have a wound, scrape, corn, or callus on your foot and: You have signs of infection. You have a fever. You have a red line going up your leg. This information is not  intended to replace advice given to you by your health care provider. Make sure you discuss any questions you have with your health care provider. Document Revised: 10/06/2021 Document Reviewed: 10/06/2021 Elsevier Patient Education  2024 [employer].

## 2022-10-24 ENCOUNTER — Encounter: Payer: Self-pay | Admitting: Family Medicine

## 2022-10-24 ENCOUNTER — Encounter: Payer: Self-pay | Admitting: Cardiology

## 2022-11-01 ENCOUNTER — Ambulatory Visit: Payer: Medicare Other | Attending: Nurse Practitioner | Admitting: Nurse Practitioner

## 2022-11-01 ENCOUNTER — Encounter: Payer: Self-pay | Admitting: Nurse Practitioner

## 2022-11-01 VITALS — BP 110/70 | HR 74 | Ht 62.0 in | Wt 141.2 lb

## 2022-11-01 DIAGNOSIS — E782 Mixed hyperlipidemia: Secondary | ICD-10-CM | POA: Diagnosis not present

## 2022-11-01 DIAGNOSIS — E119 Type 2 diabetes mellitus without complications: Secondary | ICD-10-CM | POA: Diagnosis not present

## 2022-11-01 DIAGNOSIS — I10 Essential (primary) hypertension: Secondary | ICD-10-CM | POA: Diagnosis not present

## 2022-11-01 MED ORDER — LOSARTAN POTASSIUM 100 MG PO TABS: 100.0000 mg | ORAL_TABLET | Freq: Every day | ORAL | 2 refills | Status: DC

## 2022-11-01 NOTE — Patient Instructions (Addendum)
Medication Instructions:  Your physician has recommended you make the following change in your medication:  HOLD Amlodipine for 1 week and monitor BP daily and call us with result or send them in MyChart.  Your Ideal top number of your BP is less than 140  Continue all other medications as prescribed.   Labwork: none  Testing/Procedures: none  Follow-Up: Your physician recommends that you schedule a follow-up appointment in: 6 months with Diona Browner   Any Other Special Instructions Will Be Listed Below (If Applicable).  If you need a refill on your cardiac medications before your next appointment, please call your pharmacy.

## 2022-11-01 NOTE — Progress Notes (Addendum)
Cardiology Office Note:  .   Date:  11/01/2022 ID:  Bianca Arroyo, DOB March 14, 1951, MRN [id number] PCP: [name], DO  [facility] Providers Cardiologist:  Nona Dell, MD    History of Present Illness: .   Bianca Arroyo is a very pleasant 72 y.o. female with a PMH of asthma, HTN, mixed HLD, and T2DM, who presents today for follow-up.   Last seen by Rennis Harding, NP in 2022. Was overall doing well at the time from a cardiac perspective.  Today she presents for follow-up. Doing well. Denies any chest pain, shortness of breath, palpitations, syncope, presyncope, dizziness, orthopnea, PND, swelling or significant weight changes, acute bleeding, or claudication. She is requesting a medication refill.   SH: Very active with [employer], has 3 grandchildren who live in [place].  Studies Reviewed: Marland Kitchen    EKG: EKG Interpretation Date/Time:  Tuesday November 01 2022 15:23:49 EDT Ventricular Rate:  66 PR Interval:  170 QRS Duration:  72 QT Interval:  436 QTC Calculation: 457 R Axis:   14  Text Interpretation: Normal sinus rhythm, LVH with repolarization  abnormality, Confirmed by Sharlene Dory [phone]) on 11/01/2022 3:43:01 PM   Echo 05/2017: Study Conclusions   - Left ventricle: The cavity size was normal. Wall thickness was    increased in a pattern of moderate LVH. Systolic function was    vigorous. The estimated ejection fraction was in the range of 65%    to 70%. Diastolic function is abnormal, indeterminant grade. Wall    motion was normal; there were no regional wall motion    abnormalities.  - Aortic valve: Valve area (VTI): 2.3 cm^2. Valve area (Vmax): 2.44    cm^2.  - Mitral valve: There was mild regurgitation.  - Technically adequate study.  Risk Assessment/Calculations:       The 10-year ASCVD risk score (Arnett DK, et al., 2019) is: 29.2%   Values used to calculate the score:     Age: 76 years     Sex: Female     Is Non-Hispanic  African American: Yes     Diabetic: Yes     Tobacco smoker: No     Systolic Blood Pressure: 110 mmHg     Is BP treated: Yes     HDL Cholesterol: 100 mg/dL     Total Cholesterol: 211 mg/dL  Physical Exam:   VS:  BP 110/70   Pulse 74   Ht 5\' 2"  (1.575 m)   Wt 141 lb 3.2 oz (64 kg)   SpO2 99%   BMI 25.83 kg/m    Wt Readings from Last 3 Encounters:  11/01/22 141 lb 3.2 oz (64 kg)  10/03/22 150 lb (68 kg)  07/23/22 147 lb (66.7 kg)    GEN: Well nourished, well developed in no acute distress NECK: No JVD; No carotid bruits CARDIAC: S1/S2, RR, no murmurs, rubs, gallops RESPIRATORY:  Clear to auscultation without rales, wheezing or rhonchi  ABDOMEN: Soft, non-tender, non-distended EXTREMITIES:  No edema; No deformity   ASSESSMENT AND PLAN: .    HTN BP stable today. States she would like to decrease her pill burden. Will hold Amlodipine x 1 week. Discussed to monitor BP at home at least 2 hours after medications and sitting for 5-10 minutes. SBP goal < 140. She will contact our office and let us know if her BP remains consistently elevated > 140. Continue Coreg, losartan, and spironolactone. Heart healthy diet and regular cardiovascular exercise encouraged.  Mixed HLD LDL 85 04/2022. Continue lovastatin. Heart healthy diet and regular cardiovascular exercise encouraged.   T2DM Last A1C 6.4% 04/2022. Being managed by PCP. Continue current medication regimen. Diabetic, heart healthy diet and regular cardiovascular exercise encouraged.   Dispo:  Follow-up with Dr. Diona Browner in 6 months or sooner if anything changes.   Signed, Sharlene Dory, NP

## 2022-11-11 ENCOUNTER — Ambulatory Visit: Payer: Medicare Other | Admitting: Family Medicine

## 2022-11-11 ENCOUNTER — Ambulatory Visit (HOSPITAL_COMMUNITY)
Admission: RE | Admit: 2022-11-11 | Discharge: 2022-11-11 | Disposition: A | Discharge status: Home / Self Care | Payer: Medicare Other | Source: Ambulatory Visit | Attending: Family Medicine | Admitting: Family Medicine

## 2022-11-11 ENCOUNTER — Ambulatory Visit (INDEPENDENT_AMBULATORY_CARE_PROVIDER_SITE_OTHER): Payer: Medicare Other | Admitting: Family Medicine

## 2022-11-11 VITALS — BP 109/69 | HR 81 | Temp 97.7°F | Ht 62.0 in | Wt 139.6 lb

## 2022-11-11 DIAGNOSIS — G8929 Other chronic pain: Secondary | ICD-10-CM | POA: Insufficient documentation

## 2022-11-11 DIAGNOSIS — E119 Type 2 diabetes mellitus without complications: Secondary | ICD-10-CM | POA: Diagnosis not present

## 2022-11-11 DIAGNOSIS — M25511 Pain in right shoulder: Secondary | ICD-10-CM | POA: Insufficient documentation

## 2022-11-11 DIAGNOSIS — M19011 Primary osteoarthritis, right shoulder: Secondary | ICD-10-CM | POA: Diagnosis not present

## 2022-11-11 DIAGNOSIS — E785 Hyperlipidemia, unspecified: Secondary | ICD-10-CM | POA: Diagnosis not present

## 2022-11-11 DIAGNOSIS — M25512 Pain in left shoulder: Secondary | ICD-10-CM | POA: Insufficient documentation

## 2022-11-11 DIAGNOSIS — R0602 Shortness of breath: Secondary | ICD-10-CM | POA: Diagnosis not present

## 2022-11-11 DIAGNOSIS — I10 Essential (primary) hypertension: Secondary | ICD-10-CM | POA: Diagnosis not present

## 2022-11-11 DIAGNOSIS — M19012 Primary osteoarthritis, left shoulder: Secondary | ICD-10-CM | POA: Diagnosis not present

## 2022-11-11 DIAGNOSIS — M25712 Osteophyte, left shoulder: Secondary | ICD-10-CM | POA: Diagnosis not present

## 2022-11-11 MED ORDER — GLIPIZIDE 5 MG PO TABS
ORAL_TABLET | ORAL | 0 refills | Status: DC
Start: 2022-11-11 — End: 2023-02-07

## 2022-11-11 NOTE — Patient Instructions (Signed)
Okay to stop Amlodipine.  Decrease glipizide to once daily.  Labs today.  Xray at the hospital.  Follow up in 3 months

## 2022-11-14 DIAGNOSIS — M8588 Other specified disorders of bone density and structure, other site: Secondary | ICD-10-CM | POA: Diagnosis not present

## 2022-11-14 DIAGNOSIS — G8929 Other chronic pain: Secondary | ICD-10-CM | POA: Insufficient documentation

## 2022-11-14 DIAGNOSIS — M13841 Other specified arthritis, right hand: Secondary | ICD-10-CM | POA: Diagnosis not present

## 2022-11-14 DIAGNOSIS — Z124 Encounter for screening for malignant neoplasm of cervix: Secondary | ICD-10-CM | POA: Diagnosis not present

## 2022-11-14 DIAGNOSIS — N959 Unspecified menopausal and perimenopausal disorder: Secondary | ICD-10-CM | POA: Diagnosis not present

## 2022-11-14 DIAGNOSIS — N958 Other specified menopausal and perimenopausal disorders: Secondary | ICD-10-CM | POA: Diagnosis not present

## 2022-11-14 DIAGNOSIS — J45909 Unspecified asthma, uncomplicated: Secondary | ICD-10-CM | POA: Diagnosis not present

## 2022-11-14 DIAGNOSIS — Z6826 Body mass index (BMI) 26.0-26.9, adult: Secondary | ICD-10-CM | POA: Diagnosis not present

## 2022-11-14 DIAGNOSIS — Z1231 Encounter for screening mammogram for malignant neoplasm of breast: Secondary | ICD-10-CM | POA: Diagnosis not present

## 2022-11-14 DIAGNOSIS — M13842 Other specified arthritis, left hand: Secondary | ICD-10-CM | POA: Diagnosis not present

## 2022-11-14 NOTE — Progress Notes (Signed)
Subjective:  Patient ID: Bianca Arroyo, female    DOB: 07-Feb-1951  Age: 72 y.o. MRN: [id number]  CC: Chief Complaint  Patient presents with   Hyperlipidemia   Shoulder Pain    Pt states when she raises her arms there is a soreness in her shoulders   Shortness of Breath    PT states after walking outside mainly but even in this moment is having shortness of breathe. Approx. 6-7 months    HPI:  72 year old female presents for follow-up.  Patient recently seen by cardiology.  She states that she was advised to monitor her blood pressures regularly.  Blood pressure is well-controlled.  She is on carvedilol, spironolactone, losartan.  Advised the patient that she is okay to discontinue amlodipine given good control.  Patient reports that she has had pain in the shoulders.  Associated decreased range of motion.  Has been going on for greater than a year.  Patient needs labs today.  Patient has had episodes of hypoglycemia.  She is on glipizide and metformin.  Will discuss this today.  Patient also reports that she has had some shortness of breath.  No chest pain.  She has had episodes of sweating.  Occurs at rest and with exertion.  She is able to exert herself without significant difficulty.  Patient Active Problem List   Diagnosis Date Noted   Chronic pain of both shoulders 11/14/2022   Hyperlipidemia 05/13/2022   Osteopenia 11/01/2016   Cough variant asthma 10/28/2014   Allergic rhinitis 12/17/2013   Gout 02/25/2013   Diabetes (HCC) 08/23/2012   Essential hypertension, benign 08/23/2012    Social Hx   Social History   Socioeconomic History   Marital status: Widowed    Spouse name: Not on file   Number of children: Not on file   Years of education: Not on file   Highest education level: Bachelor's degree (e.g., BA, AB, BS)  Occupational History   Not on file  Tobacco Use   Smoking status: Never   Smokeless tobacco: Never   Tobacco comments:    none  Vaping Use    Vaping status: Never Used  Substance and Sexual Activity   Alcohol use: Yes    Alcohol/week: 4.0 standard drinks of alcohol    Types: 4 Shots of liquor per week    Comment: 3-4 times per week   Drug use: No   Sexual activity: Yes    Birth control/protection: None  Other Topics Concern   Not on file  Social History Narrative   Not on file   Social Determinants of Health   Financial Resource Strain: Low Risk  (11/09/2022)   Overall Financial Resource Strain (CARDIA)    Difficulty of Paying Living Expenses: Not hard at all  Food Insecurity: No Food Insecurity (11/09/2022)   Hunger Vital Sign    Worried About Running Out of Food in the Last Year: Never true    Ran Out of Food in the Last Year: Never true  Transportation Needs: No Transportation Needs (11/09/2022)   PRAPARE - Administrator, Civil Service (Medical): No    Lack of Transportation (Non-Medical): No  Physical Activity: Sufficiently Active (11/09/2022)   Exercise Vital Sign    Days of Exercise per Week: 3 days    Minutes of Exercise per Session: 50 min  Stress: Stress Concern Present (11/09/2022)   [employer] of Occupational Health - Occupational Stress Questionnaire    Feeling of Stress : To some extent  Social Connections: Unknown (11/09/2022)   Social Connection and Isolation Panel [NHANES]    Frequency of Communication with Friends and Family: Three times a week    Frequency of Social Gatherings with Friends and Family: Three times a week    Attends Religious Services: Patient declined    Active Member of Clubs or Organizations: Yes    Attends Banker Meetings: More than 4 times per year    Marital Status: Widowed    Review of Systems Per HPI  Objective:  BP 109/69   Pulse 81   Temp 97.7 F (36.5 C)   Ht 5\' 2"  (1.575 m)   Wt 139 lb 9.6 oz (63.3 kg)   SpO2 99%   BMI 25.53 kg/m      11/11/2022   10:24 AM 11/01/2022    3:11 PM 10/03/2022   10:15 AM  BP/Weight  Systolic  BP 109 132 127  Diastolic BP 69 70 74  Wt. (Lbs) 139.6 141.2   BMI 25.53 kg/m2 25.83 kg/m2     Physical Exam Vitals and nursing note reviewed.  Constitutional:      General: She is not in acute distress.    Appearance: Normal appearance.  HENT:     Head: Normocephalic and atraumatic.  Eyes:     General:        Right eye: No discharge.        Left eye: No discharge.     Conjunctiva/sclera: Conjunctivae normal.  Cardiovascular:     Rate and Rhythm: Normal rate and regular rhythm.  Pulmonary:     Effort: Pulmonary effort is normal.     Breath sounds: Normal breath sounds. No wheezing, rhonchi or rales.  Musculoskeletal:     Comments: Right and left shoulders with normal rotator cuff strength.  Neurological:     Mental Status: She is alert.  Psychiatric:        Mood and Affect: Mood normal.        Behavior: Behavior normal.     Lab Results  Component Value Date   WBC 8.9 10/03/2022   HGB 13.3 10/03/2022   HCT 40.2 10/03/2022   PLT 307 10/03/2022   GLUCOSE 156 (H) 10/03/2022   CHOL 182 11/11/2022   TRIG 147 11/11/2022   HDL 84 11/11/2022   LDLCALC 73 11/11/2022   ALT 20 10/03/2022   AST 22 10/03/2022   NA 135 10/03/2022   K 4.3 10/03/2022   CL 102 10/03/2022   CREATININE 0.95 10/03/2022   BUN 17 10/03/2022   CO2 23 10/03/2022   TSH 0.505 04/03/2015   HGBA1C 7.2 (H) 11/11/2022   MICROALBUR 1.49 11/22/2013     Assessment & Plan:   Problem List Items Addressed This Visit       Cardiovascular and Mediastinum   Essential hypertension, benign    Discontinuing amlodipine.  Continue the remainder of her medications.        Endocrine   Diabetes (HCC) - Primary    Given hypoglycemia, decreasing glipizide.  A1c today.  Continue metformin.      Relevant Medications   glipiZIDE (GLUCOTROL) 5 MG tablet   Other Relevant Orders   Hemoglobin A1c (Completed)     Other   Chronic pain of both shoulders    X-rays for further evaluation.      Relevant Orders    DG Shoulder Left   DG Shoulder Right   Hyperlipidemia    Lipid panel today to assess.  Currently on lovastatin.  Relevant Orders   Lipid panel (Completed)   Other Visit Diagnoses     SOB (shortness of breath)       Relevant Orders   Brain natriuretic peptide (Completed)       Meds ordered this encounter  Medications   glipiZIDE (GLUCOTROL) 5 MG tablet    Sig: 1 tablet by mouth daily with a meal.    Dispense:  90 tablet    Refill:  0    Follow-up:  Return in about 3 months (around 02/11/2023).  Everlene Other DO [facility]

## 2022-11-14 NOTE — Assessment & Plan Note (Signed)
X-rays for further evaluation.

## 2022-11-14 NOTE — Assessment & Plan Note (Signed)
Lipid panel today to assess.  Currently on lovastatin.

## 2022-11-14 NOTE — Assessment & Plan Note (Signed)
Given hypoglycemia, decreasing glipizide.  A1c today.  Continue metformin.

## 2022-11-14 NOTE — Assessment & Plan Note (Signed)
Discontinuing amlodipine.  Continue the remainder of her medications.

## 2022-11-15 ENCOUNTER — Other Ambulatory Visit: Payer: Self-pay | Admitting: Obstetrics and Gynecology

## 2022-11-15 DIAGNOSIS — E1342 Other specified diabetes mellitus with diabetic polyneuropathy: Secondary | ICD-10-CM

## 2022-11-17 ENCOUNTER — Other Ambulatory Visit: Payer: Self-pay | Admitting: Obstetrics and Gynecology

## 2022-11-17 DIAGNOSIS — R928 Other abnormal and inconclusive findings on diagnostic imaging of breast: Secondary | ICD-10-CM

## 2022-11-18 ENCOUNTER — Telehealth: Payer: Self-pay | Admitting: Family Medicine

## 2022-11-18 NOTE — Telephone Encounter (Signed)
Patient would like results of recent x-rays

## 2022-11-21 NOTE — Telephone Encounter (Signed)
Bianca Sams, DO     Degenerative changes/osteoarthritis noted

## 2022-11-21 NOTE — Telephone Encounter (Signed)
Bianca Sams, DO     They have not been read by radiology yet.

## 2022-11-22 ENCOUNTER — Ambulatory Visit
Admission: RE | Admit: 2022-11-22 | Discharge: 2022-11-22 | Disposition: A | Discharge status: Home / Self Care | Payer: No Typology Code available for payment source | Source: Ambulatory Visit | Attending: Obstetrics and Gynecology | Admitting: Obstetrics and Gynecology

## 2022-11-22 DIAGNOSIS — E1342 Other specified diabetes mellitus with diabetic polyneuropathy: Secondary | ICD-10-CM

## 2022-11-22 NOTE — Telephone Encounter (Signed)
Results and provider recommendations  Seen by patient Bianca Arroyo on 11/21/2022 10:03 AM via my chart

## 2022-11-24 ENCOUNTER — Other Ambulatory Visit: Payer: Medicare Other

## 2022-11-29 ENCOUNTER — Ambulatory Visit (INDEPENDENT_AMBULATORY_CARE_PROVIDER_SITE_OTHER): Payer: Medicare Other | Admitting: Orthopaedic Surgery

## 2022-11-29 ENCOUNTER — Encounter: Payer: Self-pay | Admitting: Orthopaedic Surgery

## 2022-11-29 VITALS — BP 116/72 | HR 72 | Ht 62.0 in | Wt 141.0 lb

## 2022-11-29 DIAGNOSIS — M25512 Pain in left shoulder: Secondary | ICD-10-CM

## 2022-11-29 DIAGNOSIS — M25511 Pain in right shoulder: Secondary | ICD-10-CM | POA: Diagnosis not present

## 2022-11-29 DIAGNOSIS — G8929 Other chronic pain: Secondary | ICD-10-CM

## 2022-11-29 NOTE — Progress Notes (Addendum)
My shoulders hurt.  She has had bilateral shoulder pain, left more than right over the last several months.  She has seen her primary care and had X-rays done 11-11-22.  She has been on ibuprofen and used patches with no big help.  She has no trauma, no swelling and no redness.  She has pain rolling on the shoulders at night.  She has no numbness.  ROM left:  forward 145, abduction 140, internal 20, external 15, extension 5, adduction full. ROM Right:  forward 160, abduction 150, internal 25, external 20, extension 10, adduction full. NV intact both shoulders, grips normal, ROM neck is full.  I have independently reviewed and interpreted x-rays of this patient done at another site by another physician or qualified health professional.  Encounter Diagnosis  Name Primary?   Chronic pain of both shoulders Yes   PROCEDURE NOTE:  The patient request injection, verbal consent was obtained.  The right shoulder was prepped appropriately after time out was performed.   Sterile technique was observed and injection of 1 cc of DepoMedrol 40mg  with several cc's of plain xylocaine. Anesthesia was provided by ethyl chloride and a 20-gauge needle was used to inject the shoulder area. A posterior approach was used.  The injection was tolerated well.  A band aid dressing was applied.  The patient was advised to apply ice later today and tomorrow to the injection sight as needed.   PROCEDURE NOTE:  The patient request injection, verbal consent was obtained.  The left shoulder was prepped appropriately after time out was performed.   Sterile technique was observed and injection of 1 cc of DepoMedrol 40mg  with several cc's of plain xylocaine. Anesthesia was provided by ethyl chloride and a 20-gauge needle was used to inject the shoulder area. A posterior approach was used.  The injection was tolerated well.  A band aid dressing was applied.  The patient was advised to apply ice later today and tomorrow  to the injection sight as needed.  I will give Rx for Naprosyn 500 po bid pc.  Return in one month.  Call if any problem.  Precautions discussed.  Electronically Signed [name], MD 8/13/20242:10 PM

## 2022-12-02 ENCOUNTER — Other Ambulatory Visit: Payer: Self-pay | Admitting: Obstetrics and Gynecology

## 2022-12-02 ENCOUNTER — Ambulatory Visit
Admission: RE | Admit: 2022-12-02 | Discharge: 2022-12-02 | Disposition: A | Discharge status: Home / Self Care | Payer: Medicare Other | Source: Ambulatory Visit | Attending: Obstetrics and Gynecology | Admitting: Obstetrics and Gynecology

## 2022-12-02 ENCOUNTER — Ambulatory Visit: Payer: Medicare Other

## 2022-12-02 DIAGNOSIS — R928 Other abnormal and inconclusive findings on diagnostic imaging of breast: Secondary | ICD-10-CM

## 2022-12-02 DIAGNOSIS — R921 Mammographic calcification found on diagnostic imaging of breast: Secondary | ICD-10-CM | POA: Diagnosis not present

## 2022-12-05 ENCOUNTER — Other Ambulatory Visit: Payer: Self-pay | Admitting: Obstetrics and Gynecology

## 2022-12-05 DIAGNOSIS — R921 Mammographic calcification found on diagnostic imaging of breast: Secondary | ICD-10-CM

## 2022-12-06 DIAGNOSIS — J310 Chronic rhinitis: Secondary | ICD-10-CM | POA: Diagnosis not present

## 2022-12-06 DIAGNOSIS — J342 Deviated nasal septum: Secondary | ICD-10-CM | POA: Diagnosis not present

## 2022-12-06 DIAGNOSIS — H6122 Impacted cerumen, left ear: Secondary | ICD-10-CM | POA: Diagnosis not present

## 2022-12-06 DIAGNOSIS — R0982 Postnasal drip: Secondary | ICD-10-CM | POA: Diagnosis not present

## 2022-12-06 DIAGNOSIS — J343 Hypertrophy of nasal turbinates: Secondary | ICD-10-CM | POA: Diagnosis not present

## 2022-12-07 DIAGNOSIS — L658 Other specified nonscarring hair loss: Secondary | ICD-10-CM | POA: Diagnosis not present

## 2022-12-07 DIAGNOSIS — L700 Acne vulgaris: Secondary | ICD-10-CM | POA: Diagnosis not present

## 2022-12-12 ENCOUNTER — Encounter: Payer: Self-pay | Admitting: Orthopaedic Surgery

## 2022-12-12 ENCOUNTER — Telehealth: Payer: Self-pay | Admitting: Orthopaedic Surgery

## 2022-12-12 NOTE — Telephone Encounter (Signed)
Dr. Hilda Lias   Patient called and left message stating her prescription was called into the wrong pharmacy.   She wants her medicine to be sent to [facility] in [place]   Her phone number is [phone]

## 2022-12-12 NOTE — Telephone Encounter (Signed)
It was sent to [facility] by Dr Hilda Lias on 11/11/22 she has 5 refills remaining

## 2022-12-14 MED ORDER — NAPROXEN 500 MG PO TABS: 500.0000 mg | ORAL_TABLET | Freq: Two times a day (BID) | ORAL | 5 refills | Status: AC

## 2022-12-27 ENCOUNTER — Ambulatory Visit (INDEPENDENT_AMBULATORY_CARE_PROVIDER_SITE_OTHER): Payer: Medicare Other | Admitting: Orthopaedic Surgery

## 2022-12-27 ENCOUNTER — Encounter: Payer: Self-pay | Admitting: Orthopaedic Surgery

## 2022-12-27 VITALS — BP 140/85 | HR 85 | Ht 62.0 in | Wt 138.0 lb

## 2022-12-27 DIAGNOSIS — M25511 Pain in right shoulder: Secondary | ICD-10-CM | POA: Diagnosis not present

## 2022-12-27 DIAGNOSIS — G8929 Other chronic pain: Secondary | ICD-10-CM

## 2022-12-27 NOTE — Patient Instructions (Signed)
Central scheduling [phone] 

## 2022-12-27 NOTE — Progress Notes (Signed)
My right shoulder is worse.  She had good results from the injection into the left shoulder but the right shoulder had no results and is worse.  She has pain with overhead use and laying on it at night.  She has no new trauma, no paresthesias.  Right shoulder has painful ROM and left has good motion.  NV intact.  Encounter Diagnosis  Name Primary?   Chronic pain in right shoulder Yes   I will get MRI of the right shoulder.  Continue the Naprosyn.  Return in three weeks.  Call if any problem.  Precautions discussed.  Electronically Signed [name], MD 9/10/20241:30 PM

## 2023-01-04 ENCOUNTER — Ambulatory Visit (HOSPITAL_COMMUNITY)
Admission: RE | Admit: 2023-01-04 | Discharge: 2023-01-04 | Disposition: A | Discharge status: Home / Self Care | Payer: Medicare Other | Source: Ambulatory Visit | Attending: Orthopaedic Surgery | Admitting: Orthopaedic Surgery

## 2023-01-04 DIAGNOSIS — M25511 Pain in right shoulder: Secondary | ICD-10-CM | POA: Insufficient documentation

## 2023-01-04 DIAGNOSIS — M25411 Effusion, right shoulder: Secondary | ICD-10-CM | POA: Diagnosis not present

## 2023-01-04 DIAGNOSIS — M19011 Primary osteoarthritis, right shoulder: Secondary | ICD-10-CM | POA: Diagnosis not present

## 2023-01-04 DIAGNOSIS — G8929 Other chronic pain: Secondary | ICD-10-CM | POA: Diagnosis not present

## 2023-01-04 DIAGNOSIS — M7581 Other shoulder lesions, right shoulder: Secondary | ICD-10-CM | POA: Diagnosis not present

## 2023-01-17 ENCOUNTER — Ambulatory Visit (INDEPENDENT_AMBULATORY_CARE_PROVIDER_SITE_OTHER): Payer: Medicare Other | Admitting: Orthopaedic Surgery

## 2023-01-17 ENCOUNTER — Encounter (INDEPENDENT_AMBULATORY_CARE_PROVIDER_SITE_OTHER): Payer: Self-pay | Admitting: Otolaryngology

## 2023-01-17 ENCOUNTER — Encounter: Payer: Self-pay | Admitting: Orthopaedic Surgery

## 2023-01-17 ENCOUNTER — Ambulatory Visit (INDEPENDENT_AMBULATORY_CARE_PROVIDER_SITE_OTHER): Payer: Medicare Other | Admitting: Otolaryngology

## 2023-01-17 VITALS — BP 132/82 | HR 81 | Ht 62.0 in | Wt 138.0 lb

## 2023-01-17 VITALS — Ht 62.0 in | Wt 139.0 lb

## 2023-01-17 DIAGNOSIS — R0982 Postnasal drip: Secondary | ICD-10-CM | POA: Diagnosis not present

## 2023-01-17 DIAGNOSIS — G8929 Other chronic pain: Secondary | ICD-10-CM

## 2023-01-17 DIAGNOSIS — J310 Chronic rhinitis: Secondary | ICD-10-CM | POA: Diagnosis not present

## 2023-01-17 DIAGNOSIS — M19011 Primary osteoarthritis, right shoulder: Secondary | ICD-10-CM | POA: Diagnosis not present

## 2023-01-17 DIAGNOSIS — M25512 Pain in left shoulder: Secondary | ICD-10-CM | POA: Diagnosis not present

## 2023-01-17 DIAGNOSIS — J343 Hypertrophy of nasal turbinates: Secondary | ICD-10-CM

## 2023-01-17 DIAGNOSIS — J342 Deviated nasal septum: Secondary | ICD-10-CM

## 2023-01-17 DIAGNOSIS — R0981 Nasal congestion: Secondary | ICD-10-CM

## 2023-01-17 DIAGNOSIS — M25511 Pain in right shoulder: Secondary | ICD-10-CM

## 2023-01-17 MED ORDER — METHYLPREDNISOLONE ACETATE 40 MG/ML IJ SUSP
40.0000 mg | Freq: Once | INTRAMUSCULAR | Status: AC
  Administered 2023-01-17: 40 mg via INTRA_ARTICULAR

## 2023-01-17 NOTE — Progress Notes (Deleted)
The patient is a 72 year old female who returns today for her follow-up evaluation.  She was previously seen for chronic nasal congestion and chronic postnasal drainage.  At her last visit 6 weeks ago, she was noted to have nasal mucosal congestion, nasal septal deviation, and bilateral inferior turbinate hypertrophy.  Chronic postnasal drainage was also noted.  She was treated with nasal saline irrigation and Atrovent nasal spray.  The patient returns today reporting significant improvement in her symptoms.  Her nasal drainage is currently under control.  Exam: General: Communicates without difficulty, well nourished, no acute distress. Head: Normocephalic, no evidence injury, no tenderness, facial buttresses intact without stepoff. Face/sinus: No tenderness to palpation and percussion. Facial movement is normal and symmetric. Eyes: PERRL, EOMI. No scleral icterus, conjunctivae clear. Neuro: CN II exam reveals vision grossly intact.  No nystagmus at any point of gaze. Ears: Auricles well formed without lesions.  Left ear cerumen impaction.  Nose: External evaluation reveals normal support and skin without lesions.  Dorsum is intact.  Anterior rhinoscopy reveals congested mucosa over anterior aspect of inferior turbinates and intact septum.  No purulence noted. Oral:  Oral cavity and oropharynx are intact, symmetric, without erythema or edema.  Mucosa is moist without lesions. Neck: Full range of motion without pain.  There is no significant lymphadenopathy.  No masses palpable.  Thyroid bed within normal limits to palpation.  Parotid glands and submandibular glands equal bilaterally without mass.  Trachea is midline. Neuro:  CN 2-12 grossly intact.   Assessment  1.  Chronic rhinitis with nasal mucosal congestion, nasal septal deviation, and bilateral inferior turbinate hypertrophy.  The severity of her nasal congestion has decreased. 2.  Chronic postnasal drainage, currently under controlled with the use of  Atrovent.   Plan  1.  The physical exam findings are reviewed with the patient. 2.  Continue the use of Atrovent as needed. 3.  The patient will return for reevaluation in 6 months, sooner if needed.

## 2023-01-17 NOTE — Progress Notes (Signed)
My shoulders hurt.  She had MRI of the right shoulder showing: IMPRESSION: 1. Severe tendinosis of the supraspinatus and infraspinatus tendons. 2. Severe tendinosis of the subscapularis tendon. 3. Severe tendinosis of the intra-articular portion of the long head of the biceps tendon. 4. Moderate osteoarthritis of the glenohumeral joint.  I have explained the findings to her.  I have told her she may need a total shoulder.  We discussed treatment plans.  She is to continue Naprosyn and consider occasional injections.  She does not want to consider surgery.  Both shoulders have tenderness but no effusion. ROM is full in both with pain in the extremes.  NV intact.  Encounter Diagnoses  Name Primary?   Chronic pain in right shoulder Yes   Chronic pain in left shoulder    PROCEDURE NOTE:  The patient request injection, verbal consent was obtained.  The right shoulder was prepped appropriately after time out was performed.   Sterile technique was observed and injection of 1 cc of DepoMedrol 40mg  with several cc's of plain xylocaine. Anesthesia was provided by ethyl chloride and a 20-gauge needle was used to inject the shoulder area. A posterior approach was used.  The injection was tolerated well.  A band aid dressing was applied.  The patient was advised to apply ice later today and tomorrow to the injection sight as needed.   PROCEDURE NOTE:  The patient request injection, verbal consent was obtained.  The left shoulder was prepped appropriately after time out was performed.   Sterile technique was observed and injection of 1 cc of DepoMedrol 40mg  with several cc's of plain xylocaine. Anesthesia was provided by ethyl chloride and a 20-gauge needle was used to inject the shoulder area. A posterior approach was used.  The injection was tolerated well.  A band aid dressing was applied.  The patient was advised to apply ice later today and tomorrow to the injection sight as  needed.  Return in six weeks.  Call if any problem.  Precautions discussed.  Electronically Signed [name], MD 10/1/202410:18 AM

## 2023-01-17 NOTE — Progress Notes (Signed)
Patient ID: Bianca Arroyo, female   DOB: 1951-01-24, 72 y.o.   MRN: [id number]  The patient is a 72 year old female who returns today for her follow-up evaluation.  She was previously seen for chronic nasal congestion and chronic postnasal drainage.  At her last visit 6 weeks ago, she was noted to have nasal mucosal congestion, nasal septal deviation, and bilateral inferior turbinate hypertrophy.  Chronic postnasal drainage was also noted.  She was treated with nasal saline irrigation and Atrovent nasal spray.  The patient returns today reporting significant improvement in her symptoms.  Her nasal drainage is currently under control.  Exam: General: Communicates without difficulty, well nourished, no acute distress. Head: Normocephalic, no evidence injury, no tenderness, facial buttresses intact without stepoff. Face/sinus: No tenderness to palpation and percussion. Facial movement is normal and symmetric. Eyes: PERRL, EOMI. No scleral icterus, conjunctivae clear. Neuro: CN II exam reveals vision grossly intact.  No nystagmus at any point of gaze. Ears: Auricles well formed without lesions.  Left ear cerumen impaction.  Nose: External evaluation reveals normal support and skin without lesions.  Dorsum is intact.  Anterior rhinoscopy reveals congested mucosa over anterior aspect of inferior turbinates and intact septum.  No purulence noted. Oral:  Oral cavity and oropharynx are intact, symmetric, without erythema or edema.  Mucosa is moist without lesions. Neck: Full range of motion without pain.  There is no significant lymphadenopathy.  No masses palpable.  Thyroid bed within normal limits to palpation.  Parotid glands and submandibular glands equal bilaterally without mass.  Trachea is midline. Neuro:  CN 2-12 grossly intact.   Assessment  1.  Chronic rhinitis with nasal mucosal congestion, nasal septal deviation, and bilateral inferior turbinate hypertrophy.  The severity of her nasal congestion has  decreased. 2.  Chronic postnasal drainage, currently under controlled with the use of Atrovent.   Plan  1.  The physical exam findings are reviewed with the patient. 2.  Continue the use of Atrovent as needed. 3.  The patient will return for reevaluation in 6 months, sooner if needed.

## 2023-01-27 ENCOUNTER — Other Ambulatory Visit: Payer: Self-pay | Admitting: Family Medicine

## 2023-02-04 ENCOUNTER — Other Ambulatory Visit: Payer: Self-pay | Admitting: Family Medicine

## 2023-02-04 DIAGNOSIS — E119 Type 2 diabetes mellitus without complications: Secondary | ICD-10-CM

## 2023-02-07 DIAGNOSIS — Z23 Encounter for immunization: Secondary | ICD-10-CM | POA: Diagnosis not present

## 2023-02-10 ENCOUNTER — Ambulatory Visit: Payer: Medicare Other | Admitting: Family Medicine

## 2023-04-17 ENCOUNTER — Encounter: Payer: Self-pay | Admitting: Family Medicine

## 2023-04-19 ENCOUNTER — Other Ambulatory Visit: Payer: Self-pay | Admitting: Family Medicine

## 2023-04-19 DIAGNOSIS — E119 Type 2 diabetes mellitus without complications: Secondary | ICD-10-CM

## 2023-04-22 LAB — MICROALBUMIN / CREATININE URINE RATIO
Creatinine, Urine: 69 mg/dL
Microalb/Creat Ratio: 26 mg/g{creat} (ref 0–29)
Microalbumin, Urine: 17.9 ug/mL

## 2023-05-09 ENCOUNTER — Encounter: Payer: Self-pay | Admitting: Cardiology

## 2023-05-09 ENCOUNTER — Ambulatory Visit: Payer: Medicare Other | Attending: Cardiology | Admitting: Cardiology

## 2023-05-09 VITALS — BP 144/80 | HR 80 | Ht 62.0 in | Wt 137.8 lb

## 2023-05-09 DIAGNOSIS — R931 Abnormal findings on diagnostic imaging of heart and coronary circulation: Secondary | ICD-10-CM | POA: Diagnosis not present

## 2023-05-09 DIAGNOSIS — E782 Mixed hyperlipidemia: Secondary | ICD-10-CM

## 2023-05-09 DIAGNOSIS — I10 Essential (primary) hypertension: Secondary | ICD-10-CM

## 2023-05-09 NOTE — Patient Instructions (Signed)
Medication Instructions:   Your physician recommends that you continue on your current medications as directed. Please refer to the Current Medication list given to you today.   Labwork: One today  Testing/Procedures: None today  Follow-Up: 1 year with Dr.McDowell  Any Other Special Instructions Will Be Listed Below (If Applicable).  If you need a refill on your cardiac medications before your next appointment, please call your pharmacy.

## 2023-05-09 NOTE — Progress Notes (Signed)
    Cardiology Office Note  Date: 05/09/2023   ID: Bianca Arroyo, DOB 06/04/1950, MRN [id number]  History of Present Illness: Bianca Arroyo is a 73 y.o. female last seen in July 2024 by Ms. Peck NP, I reviewed the note (our last visit was in 2019).  She is here for a routine visit.  She does not report any interval exertional symptoms.  [facility], no change in stamina.  She has been having some orthopedic right shoulder concerns and is following with an orthopedist.  No plan for surgery in the near future.  I reviewed her medications.  Current regimen includes Coreg, Cozaar, Mevacor, and Aldactone.  She reports compliance with her therapy and continues to follow with Dr. [name].  I did review interval ECG and lab work.  Physical Exam: VS:  BP (!) 144/80   Pulse 80   Ht 5\' 2"  (1.575 m)   Wt 137 lb 12.8 oz (62.5 kg)   SpO2 98%   BMI 25.20 kg/m , BMI Body mass index is 25.2 kg/m.  Wt Readings from Last 3 Encounters:  05/09/23 137 lb 12.8 oz (62.5 kg)  01/17/23 139 lb (63 kg)  01/17/23 138 lb (62.6 kg)    General: Patient appears comfortable at rest. HEENT: Conjunctiva and lids normal. Neck: Supple, no elevated JVP or carotid bruits. Lungs: Clear to auscultation, nonlabored breathing at rest. Cardiac: Regular rate and rhythm, no S3 or significant systolic murmur, no pericardial rub.  ECG:  An ECG dated 11/01/2022 was personally reviewed today and demonstrated:  Sinus rhythm with anterolateral ST-T wave abnormalities, old.  Labwork: 10/03/2022: ALT 20; AST 22; BUN 17; Creatinine, Ser 0.95; Hemoglobin 13.3; Magnesium 1.7; Platelets 307; Potassium 4.3; Sodium 135 11/11/2022: BNP 17.1     Component Value Date/Time   CHOL 182 11/11/2022 1135   TRIG 147 11/11/2022 1135   HDL 84 11/11/2022 1135   CHOLHDL 2.2 11/11/2022 1135   CHOLHDL 2.5 06/24/2014 1058   VLDL 13 06/24/2014 1058   LDLCALC 73 11/11/2022 1135   Other Studies Reviewed  Today:  No interval cardiac testing for review today.  Assessment and Plan:  1.  Primary hypertension.  Continue to track blood pressure at home and maintain regular exercise plan.  She reports compliance with Cozaar, Aldactone, and Coreg.  2.  Mixed hyperlipidemia.  Continue Mevacor.  3.  Coronary calcium score of 9 as of August 2024.  Disposition:  Follow up  1 year.  Signed, Jonelle Sidle, M.D., F.A.C.C. [facility]

## 2023-05-17 ENCOUNTER — Encounter: Payer: Self-pay | Admitting: Orthopaedic Surgery

## 2023-05-17 ENCOUNTER — Ambulatory Visit: Payer: Medicare Other | Admitting: Orthopaedic Surgery

## 2023-05-17 VITALS — BP 148/89 | HR 80 | Ht 62.0 in | Wt 137.0 lb

## 2023-05-17 DIAGNOSIS — G8929 Other chronic pain: Secondary | ICD-10-CM

## 2023-05-17 DIAGNOSIS — M25511 Pain in right shoulder: Secondary | ICD-10-CM | POA: Diagnosis not present

## 2023-05-17 MED ORDER — METHYLPREDNISOLONE ACETATE 40 MG/ML IJ SUSP
40.0000 mg | Freq: Once | INTRAMUSCULAR | Status: AC
  Administered 2023-05-17: 40 mg via INTRA_ARTICULAR

## 2023-05-17 NOTE — Progress Notes (Signed)
My shoulder is worse.  She would like to have her shoulder further evaluated.  I had MRI done in October.  She did fair with the injection then but her pain has returned.  I will have Dr. Dallas Schimke see her for further evaluation.  Right shoulder has full motion but pain in the extremes.  NV intact.  Neck has full motion.  Grips normal.  Encounter Diagnosis  Name Primary?   Chronic pain in right shoulder Yes   PROCEDURE NOTE:  The patient request injection, verbal consent was obtained.  The right shoulder was prepped appropriately after time out was performed.   Sterile technique was observed and injection of 1 cc of DepoMedrol 40mg  with several cc's of plain xylocaine. Anesthesia was provided by ethyl chloride and a 20-gauge needle was used to inject the shoulder area. A posterior approach was used.  The injection was tolerated well.  A band aid dressing was applied.  The patient was advised to apply ice later today and tomorrow to the injection sight as needed.  I will have Dr. Dallas Schimke see her about the right shoulder.  Call if any problem.  Precautions discussed.  Electronically Signed [name], MD 1/29/20252:34 PM

## 2023-05-24 ENCOUNTER — Encounter: Payer: Self-pay | Admitting: Orthopedic Surgery

## 2023-05-24 ENCOUNTER — Ambulatory Visit: Payer: Medicare Other | Admitting: Orthopedic Surgery

## 2023-05-24 VITALS — BP 147/94 | HR 74

## 2023-05-24 DIAGNOSIS — M7581 Other shoulder lesions, right shoulder: Secondary | ICD-10-CM | POA: Diagnosis not present

## 2023-05-24 DIAGNOSIS — M19011 Primary osteoarthritis, right shoulder: Secondary | ICD-10-CM | POA: Diagnosis not present

## 2023-05-24 NOTE — Progress Notes (Signed)
 New Patient Visit  Assessment: Bianca Arroyo is a 73 y.o. female with the following: 1. Glenohumeral arthritis, right 2. Rotator cuff tendinitis, right  Plan: Remas S Szuch has pain in her right shoulder.  It has improved since an injection 1 week ago.  On MRI, she has severe tendinosis of the supraspinatus, infraspinatus, subscapularis and the long head of the biceps tendon.  In addition, she has moderate degenerative changes.  Pain could be a result of the tendinosis secondary to arthritis, or 2 separate issues.  This was discussed with the patient in clinic today.  Most recent injection has been effective.  If this starts to wane, I would recommend an ultrasound-guided glenohumeral joint injection.  She states her understanding.  Nothing further needed at this time.  She will return to clinic as needed.  Follow-up: Return if symptoms worsen or fail to improve.  Subjective:  Chief Complaint  Patient presents with   Shoulder Pain    Bilat R > L pain on and off for 1-2 yrs. Pt states injections do help the shoulder she is just here to discuss options     History of Present Illness: Bianca Arroyo is a 73 y.o. female who presents for evaluation of right shoulder pain.  She is right-hand dominant.  She is very active.  She has been followed by Dr. Brenna.  She has had pain in both shoulders for the last 1-2 years.  Right shoulder has been worse.  She has been managed with medicines and injections.  Injections have been effective.  She was seen last week, and had an injection.  Since then, her pain is better.  No specific injury.   Review of Systems: No fevers or chills No numbness or tingling No chest pain No shortness of breath No bowel or bladder dysfunction No GI distress No headaches   Medical History:  Past Medical History:  Diagnosis Date   Allergy  1965   seasonal, colcrys (~2014)   Arthritis 1990   Asthma 2016   H/O echocardiogram    a. 05/2017: echo  showing EF of 65-70%, no regional WMA, moderate LVH, and mild MR.    Hyperlipidemia    Hypertension    Type 2 diabetes mellitus (HCC)     Past Surgical History:  Procedure Laterality Date   BREAST BIOPSY Left    benign   CESAREAN SECTION     COLONOSCOPY     COLONOSCOPY N/A 12/06/2013   Dr. Harvey: Left colon redundant, diverticulosis, 2 polyps removed, one tubular adenoma the other hyperplastic.  Consider colonoscopy in 5 to 10 years with overtube   COLONOSCOPY WITH PROPOFOL  N/A 06/11/2019   Procedure: COLONOSCOPY WITH PROPOFOL ;  Surgeon: Harvey Margo CROME, MD;  Location: [facility];  Service: Endoscopy;  Laterality: N/A;  10:30am   EYE SURGERY  ~1995   [facility]   FRACTURE SURGERY  2003 and 2012   left wrist and right thumb   Left wrist surgery     POLYPECTOMY  06/11/2019   Procedure: POLYPECTOMY;  Surgeon: Harvey Margo CROME, MD;  Location: [facility];  Service: Endoscopy;;   Right trigger thumb  2013   tummy tuck      Family History  Problem Relation Age of Onset   Diabetes Father    Hypertension Father    Diabetes Mother    High blood pressure Mother    High blood pressure Sister    Diabetes Sister    Colon cancer Neg Hx  Social History   Tobacco Use   Smoking status: Never   Smokeless tobacco: Never   Tobacco comments:    none  Vaping Use   Vaping status: Never Used  Substance Use Topics   Alcohol  use: Yes    Alcohol /week: 4.0 standard drinks of alcohol     Types: 4 Shots of liquor per week    Comment: 3-4 times per week   Drug use: No    Allergies  Allergen Reactions   Colchicine Diarrhea    GI trouble    Current Meds  Medication Sig   albuterol  (VENTOLIN  HFA) 108 (90 Base) MCG/ACT inhaler Inhale 2 puffs into the lungs every 6 (six) hours as needed for wheezing or shortness of breath.   blood glucose meter kit and supplies Dispense based on patient and insurance preference. Use to test glucose once daily.  (FOR ICD -10 E11.9)   carvedilol  (COREG ) 25  MG tablet TAKE 1 TABLET BY MOUTH TWICE DAILY WITH A MEAL   ciclopirox  (PENLAC ) 8 % solution Apply topically at bedtime. Apply over nail and surrounding skin. Apply daily over previous coat. After seven (7) days, may remove with alcohol  and continue cycle.   fluticasone  (FLONASE ) 50 MCG/ACT nasal spray USE TWO SPRAY(S) IN EACH NOSTRIL ONCE DAILY   fluticasone  (FLOVENT  HFA) 44 MCG/ACT inhaler Inhale 2 puffs by mouth twice daily   glipiZIDE  (GLUCOTROL ) 5 MG tablet TAKE 1 TABLET BY MOUTH ONCE DAILY WITH A MEAL   losartan  (COZAAR ) 100 MG tablet Take 1 tablet (100 mg total) by mouth daily.   lovastatin  (MEVACOR ) 20 MG tablet Take 1 tablet (20 mg total) by mouth daily.   metFORMIN  (GLUCOPHAGE ) 500 MG tablet Take 1 tablet (500 mg total) by mouth 2 (two) times daily with a meal. Take by mouth daily with breakfast.   montelukast  (SINGULAIR ) 10 MG tablet Take 1 tablet (10 mg total) by mouth at bedtime.   Multiple Vitamins-Minerals (ALIVE ONCE DAILY WOMENS 50+ PO) Take 1 tablet by mouth every morning.    naproxen  (NAPROSYN ) 500 MG tablet Take 1 tablet (500 mg total) by mouth 2 (two) times daily with a meal.   naproxen  (NAPROSYN ) 500 MG tablet Take 1 tablet (500 mg total) by mouth 2 (two) times daily with a meal.   spironolactone  (ALDACTONE ) 50 MG tablet Take 1 tablet by mouth once daily   Tetrahydrozoline HCl (VISINE OP) Place 1 drop into both eyes daily as needed (dry eye relief).    valACYclovir  (VALTREX ) 500 MG tablet TAKE 1 TABLET BY MOUTH TWICE DAILY AS NEEDED FOR 3 DAYS FOR FLARES   vitamin B-12 (CYANOCOBALAMIN ) 1000 MCG tablet Take 1,000 mcg by mouth once a week.     Objective: BP (!) 147/94   Pulse 74   Physical Exam:  General: Alert and oriented. and No acute distress. Gait: Normal gait.  Right shoulder without deformity.  No redness.  No bruising.  Diffuse tenderness to palpation.  She has near full range of motion.  She has some pain with strength testing, but otherwise has good strength.   Sensation is intact distally.  IMAGING: I personally reviewed images previously obtained in clinic  Right shoulder MRI  IMPRESSION: 1. Severe tendinosis of the supraspinatus and infraspinatus tendons. 2. Severe tendinosis of the subscapularis tendon. 3. Severe tendinosis of the intra-articular portion of the long head of the biceps tendon. 4. Moderate osteoarthritis of the glenohumeral joint.   New Medications:  No orders of the defined types were placed in this  encounter.     Oneil DELENA Horde, MD  05/24/2023 12:17 PM

## 2023-06-03 ENCOUNTER — Other Ambulatory Visit: Payer: Self-pay | Admitting: Family Medicine

## 2023-06-03 DIAGNOSIS — E119 Type 2 diabetes mellitus without complications: Secondary | ICD-10-CM

## 2023-06-06 ENCOUNTER — Ambulatory Visit
Admission: RE | Admit: 2023-06-06 | Discharge: 2023-06-06 | Disposition: A | Discharge status: Home / Self Care | Payer: Medicare Other | Source: Ambulatory Visit | Attending: Obstetrics and Gynecology | Admitting: Obstetrics and Gynecology

## 2023-06-06 DIAGNOSIS — R921 Mammographic calcification found on diagnostic imaging of breast: Secondary | ICD-10-CM

## 2023-06-10 ENCOUNTER — Other Ambulatory Visit: Payer: Self-pay | Admitting: Obstetrics and Gynecology

## 2023-06-10 DIAGNOSIS — R921 Mammographic calcification found on diagnostic imaging of breast: Secondary | ICD-10-CM

## 2023-07-14 NOTE — Progress Notes (Signed)
 This encounter was created in error - please disregard.

## 2023-07-18 ENCOUNTER — Ambulatory Visit (INDEPENDENT_AMBULATORY_CARE_PROVIDER_SITE_OTHER): Payer: Medicare Other

## 2023-07-18 ENCOUNTER — Encounter (INDEPENDENT_AMBULATORY_CARE_PROVIDER_SITE_OTHER): Payer: Self-pay

## 2023-07-18 VITALS — BP 174/78 | HR 73 | Ht 62.0 in | Wt 138.0 lb

## 2023-07-18 DIAGNOSIS — J310 Chronic rhinitis: Secondary | ICD-10-CM | POA: Diagnosis not present

## 2023-07-18 DIAGNOSIS — J342 Deviated nasal septum: Secondary | ICD-10-CM

## 2023-07-18 DIAGNOSIS — R0982 Postnasal drip: Secondary | ICD-10-CM

## 2023-07-18 DIAGNOSIS — J343 Hypertrophy of nasal turbinates: Secondary | ICD-10-CM

## 2023-07-18 DIAGNOSIS — R0981 Nasal congestion: Secondary | ICD-10-CM

## 2023-07-18 MED ORDER — IPRATROPIUM BROMIDE 0.06 % NA SOLN: 2.0000 | Freq: Two times a day (BID) | NASAL | 12 refills | Status: AC | PRN

## 2023-07-18 NOTE — Progress Notes (Unsigned)
 Patient ID: Bianca Arroyo, female   DOB: 08-Mar-1951, 73 y.o.   MRN: [id number]  Follow-up: Chronic nasal congestion, nasal drainage  HPI: The patient is a 73 year old female who returns today for follow-up evaluation.

## 2023-07-19 DIAGNOSIS — J310 Chronic rhinitis: Secondary | ICD-10-CM | POA: Insufficient documentation

## 2023-07-19 DIAGNOSIS — J342 Deviated nasal septum: Secondary | ICD-10-CM | POA: Insufficient documentation

## 2023-07-19 DIAGNOSIS — J343 Hypertrophy of nasal turbinates: Secondary | ICD-10-CM | POA: Insufficient documentation

## 2023-07-24 ENCOUNTER — Ambulatory Visit: Admitting: Family Medicine

## 2023-07-24 ENCOUNTER — Encounter: Payer: Self-pay | Admitting: Family Medicine

## 2023-07-24 VITALS — BP 152/71 | HR 73 | Temp 97.9°F | Ht 62.0 in | Wt 132.0 lb

## 2023-07-24 DIAGNOSIS — I10 Essential (primary) hypertension: Secondary | ICD-10-CM | POA: Diagnosis not present

## 2023-07-24 MED ORDER — AMLODIPINE BESYLATE 5 MG PO TABS: 5.0000 mg | ORAL_TABLET | Freq: Every day | ORAL | 3 refills | Status: AC

## 2023-07-24 NOTE — Patient Instructions (Signed)
Medication as prescribed.  Follow up in 3 months.  

## 2023-07-24 NOTE — Progress Notes (Signed)
 Subjective:  Patient ID: Bianca Arroyo, female    DOB: 1950/11/29  Age: 73 y.o. MRN: [id number]  CC:   Chief Complaint  Patient presents with   Hypertension    HPI:  73 year old female presents for follow-up regarding hypertension.  Patient states that her blood pressure readings at home have been elevated.  She endorses compliance with her medications: Losartan, carvedilol, and spironolactone.  Patient states that she has had some sweats as well.  Occur predominantly at night.  Patient has increased her hydration and her blood pressure has improved some.  Patient is concerned.  No other complaints or concerns at this time.  Patient Active Problem List   Diagnosis Date Noted   Chronic rhinitis 07/19/2023   Deviated nasal septum 07/19/2023   Hypertrophy of nasal turbinates 07/19/2023   Postnasal drip 01/17/2023   Chronic pain of both shoulders 11/14/2022   Hyperlipidemia 05/13/2022   Osteopenia 11/01/2016   Cough variant asthma 10/28/2014   Gout 02/25/2013   Diabetes (HCC) 08/23/2012   Essential hypertension, benign 08/23/2012    Social Hx   Social History   Socioeconomic History   Marital status: Widowed    Spouse name: Not on file   Number of children: Not on file   Years of education: Not on file   Highest education level: Bachelor's degree (e.g., BA, AB, BS)  Occupational History   Not on file  Tobacco Use   Smoking status: Never   Smokeless tobacco: Never   Tobacco comments:    none  Vaping Use   Vaping status: Never Used  Substance and Sexual Activity   Alcohol use: Yes    Alcohol/week: 4.0 standard drinks of alcohol    Types: 4 Shots of liquor per week    Comment: 3-4 times per week   Drug use: No   Sexual activity: Yes    Birth control/protection: None  Other Topics Concern   Not on file  Social History Narrative   Not on file   Social Drivers of Health   Financial Resource Strain: Low Risk  (07/11/2023)   Overall Financial Resource Strain  (CARDIA)    Difficulty of Paying Living Expenses: Not hard at all  Food Insecurity: No Food Insecurity (07/11/2023)   Hunger Vital Sign    Worried About Running Out of Food in the Last Year: Never true    Ran Out of Food in the Last Year: Never true  Transportation Needs: No Transportation Needs (07/11/2023)   PRAPARE - Administrator, Civil Service (Medical): No    Lack of Transportation (Non-Medical): No  Physical Activity: Insufficiently Active (07/11/2023)   Exercise Vital Sign    Days of Exercise per Week: 3 days    Minutes of Exercise per Session: 30 min  Stress: No Stress Concern Present (07/11/2023)   [employer] of Occupational Health - Occupational Stress Questionnaire    Feeling of Stress : Only a little  Social Connections: Moderately Isolated (07/11/2023)   Social Connection and Isolation Panel [NHANES]    Frequency of Communication with Friends and Family: Twice a week    Frequency of Social Gatherings with Friends and Family: Twice a week    Attends Religious Services: Never    Database administrator or Organizations: Yes    Attends Engineer, structural: More than 4 times per year    Marital Status: Widowed    Review of Systems Per HPI  Objective:  BP (!) 152/71   Pulse  73   Temp 97.9 F (36.6 C)   Ht 5\' 2"  (1.575 m)   Wt 132 lb (59.9 kg)   SpO2 98%   BMI 24.14 kg/m      07/24/2023    9:51 AM 07/18/2023    1:01 PM 05/24/2023   11:28 AM  BP/Weight  Systolic BP 152 174 147  Diastolic BP 71 78 94  Wt. (Lbs) 132 138   BMI 24.14 kg/m2 25.24 kg/m2     Physical Exam Vitals and nursing note reviewed.  Constitutional:      General: She is not in acute distress.    Appearance: Normal appearance.  HENT:     Head: Normocephalic and atraumatic.  Eyes:     General:        Right eye: No discharge.        Left eye: No discharge.     Conjunctiva/sclera: Conjunctivae normal.  Cardiovascular:     Rate and Rhythm: Normal rate and regular  rhythm.  Pulmonary:     Effort: Pulmonary effort is normal.     Breath sounds: Normal breath sounds. No wheezing, rhonchi or rales.  Neurological:     Mental Status: She is alert.  Psychiatric:        Mood and Affect: Mood normal.        Behavior: Behavior normal.     Lab Results  Component Value Date   WBC 8.9 10/03/2022   HGB 13.3 10/03/2022   HCT 40.2 10/03/2022   PLT 307 10/03/2022   GLUCOSE 156 (H) 10/03/2022   CHOL 182 11/11/2022   TRIG 147 11/11/2022   HDL 84 11/11/2022   LDLCALC 73 11/11/2022   ALT 20 10/03/2022   AST 22 10/03/2022   NA 135 10/03/2022   K 4.3 10/03/2022   CL 102 10/03/2022   CREATININE 0.95 10/03/2022   BUN 17 10/03/2022   CO2 23 10/03/2022   TSH 0.505 04/03/2015   HGBA1C 7.2 (H) 11/11/2022   MICROALBUR 1.49 11/22/2013     Assessment & Plan:  Essential hypertension, benign Assessment & Plan: Recent worsening/exacerbation.  Uncontrolled.  Adding amlodipine.   Other orders -     amLODIPine Besylate; Take 1 tablet (5 mg total) by mouth daily.  Dispense: 90 tablet; Refill: 3    Follow-up:  3 months  Thersea Manfredonia [name] DO [facility]

## 2023-07-24 NOTE — Assessment & Plan Note (Signed)
 Recent worsening/exacerbation.  Uncontrolled.  Adding amlodipine.

## 2023-07-28 ENCOUNTER — Other Ambulatory Visit: Payer: Self-pay | Admitting: Family Medicine

## 2023-07-31 ENCOUNTER — Other Ambulatory Visit: Payer: Self-pay

## 2023-07-31 MED ORDER — SPIRONOLACTONE 50 MG PO TABS: 50.0000 mg | ORAL_TABLET | Freq: Every day | ORAL | 1 refills | Status: DC

## 2023-08-18 ENCOUNTER — Other Ambulatory Visit: Payer: Self-pay | Admitting: Family Medicine

## 2023-08-18 DIAGNOSIS — E119 Type 2 diabetes mellitus without complications: Secondary | ICD-10-CM

## 2023-08-21 ENCOUNTER — Other Ambulatory Visit: Payer: Self-pay

## 2023-08-21 DIAGNOSIS — E119 Type 2 diabetes mellitus without complications: Secondary | ICD-10-CM

## 2023-08-21 MED ORDER — GLIPIZIDE 5 MG PO TABS
ORAL_TABLET | ORAL | 3 refills | Status: AC
Start: 2023-08-21 — End: ?

## 2023-08-21 MED ORDER — METFORMIN HCL 500 MG PO TABS: 500.0000 mg | ORAL_TABLET | Freq: Two times a day (BID) | ORAL | 3 refills | Status: AC

## 2023-10-10 ENCOUNTER — Ambulatory Visit (INDEPENDENT_AMBULATORY_CARE_PROVIDER_SITE_OTHER): Payer: Medicare Other | Admitting: Podiatry

## 2023-10-10 DIAGNOSIS — E119 Type 2 diabetes mellitus without complications: Secondary | ICD-10-CM | POA: Diagnosis not present

## 2023-10-10 NOTE — Progress Notes (Unsigned)
 Subjective:   Patient ID: Bianca Arroyo, female   DOB: 73 y.o.   MRN: [id number]   HPI Chief Complaint  Patient presents with   [facility]    RM#[id number] [facility]    73 year old female presents the office for above concerns.  She states that she has been doing well.  She does not report any open lesions, numbness or tingling or any claudication symptoms.  Her blood sugars run between 97-1 30 when she reports.  Her last A1c was 5.6.  Review of Systems  All other systems reviewed and are negative.     Objective:  Physical Exam  General: AAO x3, NAD  Dermatological: Nails are mildly hypertrophic, dystrophic with subungual debris or yellow, brown discoloration.  No edema, erythema or signs of infection.  No ulcerations present.  Vascular: Dorsalis Pedis artery and Posterior Tibial artery pedal pulses are 2/4 bilateral with immedate capillary fill time. There is no pain with calf compression, swelling, warmth, erythema.   Neruologic: Grossly intact via light touch bilateral.  Sensation intact with Semmes Weinstein monofilament.  Musculoskeletal: Arthritic changes present noted along the RIGHT hallux IPJ.  No areas of pain on exam today.  Muscular strength 5/5 in all groups tested bilateral.  Gait: Unassisted, Nonantalgic.      Assessment:   73 year old female with onychomycosis, diabetic foot exam, arthritis     Plan:  -Treatment options discussed including all alternatives, risks, and complications -Etiology of symptoms were discussed -Dermatitis-she is doing well.  Discussed importance of daily foot inspection, glucose control.  As a courtesy I sharply debrided the nails without any complications or bleeding.  Return in about 1 year (around 10/09/2024) for diabetic foot exam .  Donnice JONELLE Fees DPM

## 2023-10-24 ENCOUNTER — Ambulatory Visit: Admitting: Family Medicine

## 2023-11-01 ENCOUNTER — Other Ambulatory Visit: Payer: Self-pay | Admitting: Family Medicine

## 2023-11-10 ENCOUNTER — Other Ambulatory Visit: Payer: Self-pay | Admitting: Family Medicine

## 2023-11-13 ENCOUNTER — Other Ambulatory Visit: Payer: Self-pay

## 2023-11-13 MED ORDER — LOSARTAN POTASSIUM 100 MG PO TABS: 100.0000 mg | ORAL_TABLET | Freq: Every day | ORAL | 2 refills | Status: AC

## 2023-11-14 ENCOUNTER — Encounter: Payer: Self-pay | Admitting: Orthopedic Surgery

## 2023-11-14 ENCOUNTER — Other Ambulatory Visit (INDEPENDENT_AMBULATORY_CARE_PROVIDER_SITE_OTHER): Payer: Self-pay

## 2023-11-14 ENCOUNTER — Ambulatory Visit (INDEPENDENT_AMBULATORY_CARE_PROVIDER_SITE_OTHER): Admitting: Orthopedic Surgery

## 2023-11-14 ENCOUNTER — Other Ambulatory Visit: Payer: Self-pay

## 2023-11-14 DIAGNOSIS — M19012 Primary osteoarthritis, left shoulder: Secondary | ICD-10-CM | POA: Diagnosis not present

## 2023-11-14 DIAGNOSIS — M19011 Primary osteoarthritis, right shoulder: Secondary | ICD-10-CM

## 2023-11-14 MED ORDER — METHYLPREDNISOLONE ACETATE 40 MG/ML IJ SUSP
40.0000 mg | Freq: Once | INTRAMUSCULAR | Status: AC
  Administered 2023-11-14: 40 mg via INTRA_ARTICULAR

## 2023-11-14 NOTE — Progress Notes (Signed)
 Return patient Visit  Assessment: Bianca Arroyo is a 73 y.o. female with the following: 1. Glenohumeral arthritis, right 2. Rotator cuff tendinitis, right 3.  Glenohumeral arthritis, left  Plan: Coleen S Paglia has recurrence of pain in her right shoulder.  She had relief following a subacromial steroid injection.  We obtained updated x-rays in clinic today, bilateral shoulders.  Pain in the right is worse than the left.  She does have degenerative changes of the right glenohumeral joint.  We previously discussed proceeding with an ultrasound-guided injection.  This was completed in clinic today.  If she is interested, I would consider ultrasound-guided injection in the left shoulder, but deferred today because of her diabetes.  She states understanding.  She will contact clinic if she would like an injection in her left shoulder.  Procedure note injection - Right shoulder, ultrasound guidance   Verbal consent was obtained to inject the Right shoulder, glenohumeral joint  Timeout was completed to confirm the site of injection.   Using the ultrasound, the rotator cuff tendons were identified.  The joint space was also identified. The skin was prepped with alcohol  and ethyl chloride was sprayed at the injection site.  A 21-gauge needle was used to inject 40 mg of Depo-Medrol  and 1% lidocaine  (4 cc) into the glenohumeral joint space of the Right shoulder using a posterolateral approach.  The needle was visualized entering the glenohumeral joint, and the medication was also visualized. There were no complications.  A sterile bandage was applied.   Note: In order to accurately identify the placement of the needle, ultrasound was required, to increase the accuracy, and specificity of the injection.   Follow-up: No follow-ups on file.  Subjective:  Chief Complaint  Patient presents with   Shoulder Pain    Right worse than left     History of Present Illness: Bianca Arroyo is  a 73 y.o. female who returns for evaluation of right shoulder pain.  She is right-hand dominant.  She is now complaining of pain in both shoulders.  Right is worse than left.  Previous injection is worn off.  No new injuries.  She continues to have pain in the left shoulder.  She has difficulty with overhead motion.  No prior injections in her left shoulder.  Review of Systems: No fevers or chills No numbness or tingling No chest pain No shortness of breath No bowel or bladder dysfunction No GI distress No headaches    Objective: There were no vitals taken for this visit.  Physical Exam:  General: Alert and oriented. and No acute distress. Gait: Normal gait.  Right shoulder without deformity.  No redness.  No bruising.  Diffuse tenderness to palpation.  160 degrees of forward flexion.  Internal rotation to lumbar spine.  She has some pain with strength testing, but otherwise has good strength.  Sensation is intact distally.  Left shoulder without deformity.  No redness.  No bruising.  160 degrees of forward flexion.  Positive Jobes.  5/5 infraspinatus strength testing.  Sensation intact distally.  IMAGING: I personally ordered and reviewed the following images   X-ray of the right shoulder was obtained in clinic today.  No acute injuries noted.  Inferior based osteophytes of the humeral head.  Mild to moderate loss of glenohumeral joint space.  No bony lesions.  Impression: Mild to moderate right glenohumeral joint arthritis   X-ray of the left shoulder was obtained in clinic today.  No acute injuries noted.  There are  some small osteophytes of the inferior aspect of the humeral head.  Moderate loss of glenohumeral joint space.  No bony lesions.  Impression: Moderate left glenohumeral joint arthritis  New Medications:  No orders of the defined types were placed in this encounter.     Oneil DELENA Horde, MD  11/14/2023 2:28 PM

## 2023-11-14 NOTE — Addendum Note (Signed)
 Addended byBETHA JENEAN GREIG LELON on: 11/14/2023 04:13 PM   Modules accepted: Orders

## 2023-11-14 NOTE — Patient Instructions (Signed)

## 2023-11-21 ENCOUNTER — Ambulatory Visit: Admitting: Orthopedic Surgery

## 2023-12-05 ENCOUNTER — Other Ambulatory Visit: Payer: Self-pay | Admitting: Obstetrics and Gynecology

## 2023-12-05 ENCOUNTER — Inpatient Hospital Stay
Admission: RE | Admit: 2023-12-05 | Discharge: 2023-12-05 | Discharge status: Home / Self Care | Payer: Medicare Other | Source: Ambulatory Visit | Attending: Obstetrics and Gynecology | Admitting: Obstetrics and Gynecology

## 2023-12-05 ENCOUNTER — Ambulatory Visit
Admission: RE | Admit: 2023-12-05 | Discharge: 2023-12-05 | Disposition: A | Discharge status: Home / Self Care | Source: Ambulatory Visit | Attending: Obstetrics and Gynecology | Admitting: Obstetrics and Gynecology

## 2023-12-05 DIAGNOSIS — R921 Mammographic calcification found on diagnostic imaging of breast: Secondary | ICD-10-CM

## 2023-12-05 DIAGNOSIS — N6320 Unspecified lump in the left breast, unspecified quadrant: Secondary | ICD-10-CM

## 2023-12-08 ENCOUNTER — Encounter: Payer: Self-pay | Admitting: Radiology

## 2024-01-09 ENCOUNTER — Ambulatory Visit: Admitting: Family Medicine

## 2024-01-09 ENCOUNTER — Ambulatory Visit (INDEPENDENT_AMBULATORY_CARE_PROVIDER_SITE_OTHER): Admitting: Physician Assistant

## 2024-01-09 ENCOUNTER — Ambulatory Visit (HOSPITAL_COMMUNITY)
Admission: RE | Admit: 2024-01-09 | Discharge: 2024-01-09 | Disposition: A | Discharge status: Home / Self Care | Source: Ambulatory Visit | Attending: Physician Assistant | Admitting: Physician Assistant

## 2024-01-09 ENCOUNTER — Encounter: Payer: Self-pay | Admitting: Physician Assistant

## 2024-01-09 ENCOUNTER — Ambulatory Visit: Payer: Self-pay | Admitting: Physician Assistant

## 2024-01-09 VITALS — BP 114/72 | Temp 97.6°F | Ht 62.0 in | Wt 132.0 lb

## 2024-01-09 DIAGNOSIS — R053 Chronic cough: Secondary | ICD-10-CM

## 2024-01-09 MED ORDER — PROMETHAZINE-DM 6.25-15 MG/5ML PO SYRP: 5.0000 mL | ORAL_SOLUTION | Freq: Four times a day (QID) | ORAL | 0 refills | Status: AC | PRN

## 2024-01-09 NOTE — Progress Notes (Addendum)
 Acute Office Visit  Subjective:     Patient ID: Bianca Arroyo, female    DOB: 1951-04-06, 73 y.o.   MRN: [id number]   Discussed the use of AI scribe software for clinical note transcription with the patient, who gave verbal consent to proceed.  History of Present Illness Bianca Arroyo is a 73 year old female who presents with a persistent cough and history of difficulty breathing.  She has experienced excessive coughing, difficulty breathing, sneezing, and nasal discharge for the past month. Two weeks ago, she visited the UC, received a Z-Pak and an injection, but symptoms persisted. The cough and yellowish-green mucus returned shortly after.  The cough is persistent, with difficulty breathing primarily during coughing episodes. Lying down worsens the cough, requiring her to sleep upright. Over-the-counter medications like Claritin and Safe-tussin provide only temporary relief, with symptoms recurring before the next dose.  She has no congestion, ear pain, sore throat, abdominal pain, nausea, vomiting, or fever. Her appetite is normal, and she has no chest pain. No one else at home is currently sick. She reports shortness of breath and difficulty breathing have improved.    Review of Systems  Constitutional:  Negative for fever and malaise/fatigue.  HENT:  Negative for congestion, ear discharge and sore throat.   Respiratory:  Positive for cough and sputum production. Negative for shortness of breath and wheezing.   Cardiovascular:  Negative for chest pain and palpitations.  Neurological:  Negative for dizziness and headaches.        Objective:     BP 114/72   Temp 97.6 F (36.4 C) (Oral)   Ht 5' 2 (1.575 m)   Wt 132 lb (59.9 kg)   SpO2 98%   BMI 24.14 kg/m   Physical Exam Constitutional:      General: She is not in acute distress.    Appearance: Normal appearance. She is normal weight. She is not ill-appearing.  HENT:     Head: Normocephalic and  atraumatic.     Mouth/Throat:     Mouth: Mucous membranes are moist.     Pharynx: Oropharynx is clear.  Eyes:     Extraocular Movements: Extraocular movements intact.     Conjunctiva/sclera: Conjunctivae normal.  Cardiovascular:     Rate and Rhythm: Normal rate and regular rhythm.     Heart sounds: Normal heart sounds. No murmur heard. Pulmonary:     Effort: Pulmonary effort is normal.     Breath sounds: Normal breath sounds. No wheezing, rhonchi or rales.  Skin:    General: Skin is warm and dry.  Neurological:     General: No focal deficit present.     Mental Status: She is alert and oriented to person, place, and time.  Psychiatric:        Mood and Affect: Mood normal.        Behavior: Behavior normal.     No results found for any visits on 01/09/24.      Assessment & Plan:  Persistent cough Assessment & Plan: Chronic cough with yellowish-green mucus for one month, dyspnea improving. Oxygen sats stable in office today at 94-96%. Differential includes post-viral cough vs acute infection. - Order chest x-ray to evaluate for infections or pneumonia. - Prescribe cough syrup for bedtime use. - Provide Mucinex samples for morning and night use. - Continue Claritin for congestion. - Review chest x-ray results and adjust treatment plan. - Follow up for worsening cough, increased sputum production, chest pain, or shortness of  breath.   Orders: -     DG Chest 2 View -     Promethazine -DM; Take 5 mLs by mouth 4 (four) times daily as needed for cough.  Dispense: 118 mL; Refill: 0   Return if symptoms worsen or fail to improve.  Charmaine Taj Arteaga, PA-C

## 2024-01-09 NOTE — Assessment & Plan Note (Addendum)
 Chronic cough with yellowish-green mucus for one month, dyspnea improving. Oxygen sats stable in office today at 94-96%. Differential includes post-viral cough vs acute infection. - Order chest x-ray to evaluate for infections or pneumonia. - Prescribe cough syrup for bedtime use. - Provide Mucinex samples for morning and night use. - Continue Claritin for congestion. - Review chest x-ray results and adjust treatment plan. - Follow up for worsening cough, increased sputum production, chest pain, or shortness of breath.

## 2024-01-22 ENCOUNTER — Telehealth: Admitting: Physician Assistant

## 2024-01-22 ENCOUNTER — Other Ambulatory Visit: Payer: Self-pay | Admitting: Physician Assistant

## 2024-01-22 DIAGNOSIS — J4541 Moderate persistent asthma with (acute) exacerbation: Secondary | ICD-10-CM | POA: Diagnosis not present

## 2024-01-22 DIAGNOSIS — R053 Chronic cough: Secondary | ICD-10-CM

## 2024-01-22 MED ORDER — ALBUTEROL SULFATE HFA 108 (90 BASE) MCG/ACT IN AERS: 2.0000 | INHALATION_SPRAY | Freq: Four times a day (QID) | RESPIRATORY_TRACT | 0 refills | Status: AC | PRN

## 2024-01-22 MED ORDER — FLUTICASONE-SALMETEROL 100-50 MCG/ACT IN AEPB: 1.0000 | INHALATION_SPRAY | Freq: Two times a day (BID) | RESPIRATORY_TRACT | 0 refills | Status: AC

## 2024-01-22 MED ORDER — AMOXICILLIN-POT CLAVULANATE 875-125 MG PO TABS: 1.0000 | ORAL_TABLET | Freq: Two times a day (BID) | ORAL | 0 refills | Status: AC

## 2024-01-22 NOTE — Progress Notes (Signed)
 Virtual Visit Consent   Bianca Arroyo, you are scheduled for a virtual visit with a [facility] provider today. Just as with appointments in the office, your consent must be obtained to participate. Your consent will be active for this visit and any virtual visit you may have with one of our providers in the next 365 days. If you have a MyChart account, a copy of this consent can be sent to you electronically.  As this is a virtual visit, video technology does not allow for your provider to perform a traditional examination. This may limit your provider's ability to fully assess your condition. If your provider identifies any concerns that need to be evaluated in person or the need to arrange testing (such as labs, EKG, etc.), we will make arrangements to do so. Although advances in technology are sophisticated, we cannot ensure that it will always work on either your end or our end. If the connection with a video visit is poor, the visit may have to be switched to a telephone visit. With either a video or telephone visit, we are not always able to ensure that we have a secure connection.  By engaging in this virtual visit, you consent to the provision of healthcare and authorize for your insurance to be billed (if applicable) for the services provided during this visit. Depending on your insurance coverage, you may receive a charge related to this service.  I need to obtain your verbal consent now. Are you willing to proceed with your visit today? Bianca Arroyo has provided verbal consent on 01/22/2024 for a virtual visit (video or telephone). Delon CHRISTELLA Dickinson, PA-C  Date: 01/22/2024 1:20 PM   Virtual Visit via Video Note   IDelon CHRISTELLA Dickinson, connected with  Bianca Arroyo  ([id number], 11-Mar-1951) on 01/22/24 at  8:30 AM EDT by a video-enabled telemedicine application and verified that I am speaking with the correct person using two identifiers.  Location: Patient: Virtual  Visit Location Patient: Home Provider: Virtual Visit Location Provider: Home Office   I discussed the limitations of evaluation and management by telemedicine and the availability of in person appointments. The patient expressed understanding and agreed to proceed.    History of Present Illness: Bianca Arroyo is a 73 y.o. who identifies as a female who was assigned female at birth, and is being seen today for cough.  HPI: Cough This is a recurrent problem. The current episode started 1 to 4 weeks ago (Seen initially at [facility] in person around 12/27/23 and given a Zpack and IM Medrol ; Saw her PCP on 01/09/24 and had negative CXR, given Promethazine  DM; No improvements). The problem has been unchanged. The problem occurs every few minutes. The cough is Productive of sputum and productive of purulent sputum. Associated symptoms include chest pain (from coughing), headaches, myalgias, nasal congestion, postnasal drip, rhinorrhea, a sore throat (from cough and drainage), shortness of breath (mild with coughing) and wheezing. Pertinent negatives include no chills, ear congestion, ear pain, fever or weight loss. The symptoms are aggravated by lying down and cold air. She has tried a beta-agonist inhaler and prescription cough suppressant (IM medrol , zpack) for the symptoms. The treatment provided no relief. Her past medical history is significant for asthma.    Problems:  Patient Active Problem List   Diagnosis Date Noted   Persistent cough 01/09/2024   Chronic rhinitis 07/19/2023   Deviated nasal septum 07/19/2023   Hypertrophy of nasal turbinates 07/19/2023   Postnasal drip  01/17/2023   Chronic pain of both shoulders 11/14/2022   Hyperlipidemia 05/13/2022   Osteopenia 11/01/2016   Cough variant asthma 10/28/2014   Gout 02/25/2013   Diabetes (HCC) 08/23/2012   Essential hypertension, benign 08/23/2012    Allergies:  Allergies  Allergen Reactions   Colchicine Diarrhea    GI trouble    Medications:  Current Outpatient Medications:    amoxicillin -clavulanate (AUGMENTIN ) 875-125 MG tablet, Take 1 tablet by mouth 2 (two) times daily., Disp: 14 tablet, Rfl: 0   fluticasone -salmeterol (ADVAIR) 100-50 MCG/ACT AEPB, Inhale 1 puff into the lungs 2 (two) times daily., Disp: 60 each, Rfl: 0   albuterol  (VENTOLIN  HFA) 108 (90 Base) MCG/ACT inhaler, Inhale 2 puffs into the lungs every 6 (six) hours as needed for wheezing or shortness of breath., Disp: 17 g, Rfl: 0   amLODipine  (NORVASC ) 5 MG tablet, Take 1 tablet (5 mg total) by mouth daily., Disp: 90 tablet, Rfl: 3   blood glucose meter kit and supplies, Dispense based on patient and insurance preference. Use to test glucose once daily.  (FOR ICD -10 E11.9), Disp: 1 each, Rfl: 5   carvedilol  (COREG ) 25 MG tablet, TAKE 1 TABLET BY MOUTH TWICE DAILY WITH A MEAL, Disp: 60 tablet, Rfl: 0   ciclopirox  (PENLAC ) 8 % solution, Apply topically at bedtime. Apply over nail and surrounding skin. Apply daily over previous coat. After seven (7) days, may remove with alcohol  and continue cycle., Disp: 6.6 mL, Rfl: 2   fluticasone  (FLONASE ) 50 MCG/ACT nasal spray, USE TWO SPRAY(S) IN EACH NOSTRIL ONCE DAILY, Disp: 16 g, Rfl: 1   glipiZIDE  (GLUCOTROL ) 5 MG tablet, TAKE 1 TABLET BY MOUTH ONCE DAILY WITH A MEAL, Disp: 90 tablet, Rfl: 3   ipratropium (ATROVENT ) 0.06 % nasal spray, Place 2 sprays into both nostrils 2 (two) times daily as needed (nasal drainage)., Disp: 15 mL, Rfl: 12   losartan  (COZAAR ) 100 MG tablet, Take 1 tablet (100 mg total) by mouth daily., Disp: 90 tablet, Rfl: 2   lovastatin  (MEVACOR ) 20 MG tablet, Take 1 tablet (20 mg total) by mouth daily., Disp: 90 tablet, Rfl: 0   metFORMIN  (GLUCOPHAGE ) 500 MG tablet, Take 1 tablet (500 mg total) by mouth 2 (two) times daily with a meal. Take by mouth daily with breakfast., Disp: 180 tablet, Rfl: 3   montelukast  (SINGULAIR ) 10 MG tablet, Take 1 tablet (10 mg total) by mouth at bedtime., Disp: 90  tablet, Rfl: 0   Multiple Vitamins-Minerals (ALIVE ONCE DAILY WOMENS 50+ PO), Take 1 tablet by mouth every morning. , Disp: , Rfl:    naproxen  (NAPROSYN ) 500 MG tablet, Take 1 tablet (500 mg total) by mouth 2 (two) times daily with a meal., Disp: 60 tablet, Rfl: 5   naproxen  (NAPROSYN ) 500 MG tablet, Take 1 tablet (500 mg total) by mouth 2 (two) times daily with a meal., Disp: 60 tablet, Rfl: 5   promethazine -dextromethorphan (PROMETHAZINE -DM) 6.25-15 MG/5ML syrup, Take 5 mLs by mouth 4 (four) times daily as needed for cough., Disp: 118 mL, Rfl: 0   spironolactone  (ALDACTONE ) 50 MG tablet, Take 1 tablet (50 mg total) by mouth daily., Disp: 90 tablet, Rfl: 1   Tetrahydrozoline HCl (VISINE OP), Place 1 drop into both eyes daily as needed (dry eye relief). , Disp: , Rfl:    valACYclovir  (VALTREX ) 500 MG tablet, TAKE 1 TABLET BY MOUTH TWICE DAILY AS NEEDED FOR 3 DAYS FOR FLARES, Disp: 6 tablet, Rfl: 0   vitamin B-12 (CYANOCOBALAMIN ) 1000 MCG tablet,  Take 1,000 mcg by mouth once a week. , Disp: , Rfl:   Observations/Objective: Patient is well-developed, well-nourished in no acute distress.  Resting comfortably at home.  Head is normocephalic, atraumatic.  No labored breathing.  Speech is clear and coherent with logical content.  Patient is alert and oriented at baseline.    Assessment and Plan: 1. Moderate persistent reactive airway disease with acute exacerbation (Primary) - albuterol  (VENTOLIN  HFA) 108 (90 Base) MCG/ACT inhaler; Inhale 2 puffs into the lungs every 6 (six) hours as needed for wheezing or shortness of breath.  Dispense: 17 g; Refill: 0 - amoxicillin -clavulanate (AUGMENTIN ) 875-125 MG tablet; Take 1 tablet by mouth 2 (two) times daily.  Dispense: 14 tablet; Refill: 0 - fluticasone -salmeterol (ADVAIR) 100-50 MCG/ACT AEPB; Inhale 1 puff into the lungs 2 (two) times daily.  Dispense: 60 each; Refill: 0  - Worsening over a week despite OTC medications and prescription medications -  Will treat with Augmentin  - Change from OTC Primatene mist to Albuterol  Rx as above - Add Advair twice daily - Can continue Mucinex  - Push fluids.  - Rest.  - Steam and humidifier can help - Seek in person evaluation if worsening or symptoms fail to improve    Follow Up Instructions: I discussed the assessment and treatment plan with the patient. The patient was provided an opportunity to ask questions and all were answered. The patient agreed with the plan and demonstrated an understanding of the instructions.  A copy of instructions were sent to the patient via MyChart unless otherwise noted below.    The patient was advised to call back or seek an in-person evaluation if the symptoms worsen or if the condition fails to improve as anticipated.    Delon CHRISTELLA Dickinson, PA-C

## 2024-01-22 NOTE — Patient Instructions (Signed)
 Bianca Arroyo, thank you for joining Delon CHRISTELLA Dickinson, PA-C for today's virtual visit.  While this provider is not your primary care provider (PCP), if your PCP is located in our provider database this encounter information will be shared with them immediately following your visit.   A [facility] MyChart account gives you access to today's visit and all your visits, tests, and labs performed at [facility]  click here if you don't have a [facility] MyChart account or go to [email or web address]  Consent: (Patient) Bianca Arroyo provided verbal consent for this virtual visit at the beginning of the encounter.  Current Medications:  Current Outpatient Medications:    amoxicillin -clavulanate (AUGMENTIN ) 875-125 MG tablet, Take 1 tablet by mouth 2 (two) times daily., Disp: 14 tablet, Rfl: 0   fluticasone -salmeterol (ADVAIR) 100-50 MCG/ACT AEPB, Inhale 1 puff into the lungs 2 (two) times daily., Disp: 60 each, Rfl: 0   albuterol  (VENTOLIN  HFA) 108 (90 Base) MCG/ACT inhaler, Inhale 2 puffs into the lungs every 6 (six) hours as needed for wheezing or shortness of breath., Disp: 17 g, Rfl: 0   amLODipine  (NORVASC ) 5 MG tablet, Take 1 tablet (5 mg total) by mouth daily., Disp: 90 tablet, Rfl: 3   blood glucose meter kit and supplies, Dispense based on patient and insurance preference. Use to test glucose once daily.  (FOR ICD -10 E11.9), Disp: 1 each, Rfl: 5   carvedilol  (COREG ) 25 MG tablet, TAKE 1 TABLET BY MOUTH TWICE DAILY WITH A MEAL, Disp: 60 tablet, Rfl: 0   ciclopirox  (PENLAC ) 8 % solution, Apply topically at bedtime. Apply over nail and surrounding skin. Apply daily over previous coat. After seven (7) days, may remove with alcohol  and continue cycle., Disp: 6.6 mL, Rfl: 2   fluticasone  (FLONASE ) 50 MCG/ACT nasal spray, USE TWO SPRAY(S) IN EACH NOSTRIL ONCE DAILY, Disp: 16 g, Rfl: 1   glipiZIDE  (GLUCOTROL ) 5 MG tablet, TAKE 1 TABLET BY MOUTH ONCE DAILY WITH A MEAL,  Disp: 90 tablet, Rfl: 3   ipratropium (ATROVENT ) 0.06 % nasal spray, Place 2 sprays into both nostrils 2 (two) times daily as needed (nasal drainage)., Disp: 15 mL, Rfl: 12   losartan  (COZAAR ) 100 MG tablet, Take 1 tablet (100 mg total) by mouth daily., Disp: 90 tablet, Rfl: 2   lovastatin  (MEVACOR ) 20 MG tablet, Take 1 tablet (20 mg total) by mouth daily., Disp: 90 tablet, Rfl: 0   metFORMIN  (GLUCOPHAGE ) 500 MG tablet, Take 1 tablet (500 mg total) by mouth 2 (two) times daily with a meal. Take by mouth daily with breakfast., Disp: 180 tablet, Rfl: 3   montelukast  (SINGULAIR ) 10 MG tablet, Take 1 tablet (10 mg total) by mouth at bedtime., Disp: 90 tablet, Rfl: 0   Multiple Vitamins-Minerals (ALIVE ONCE DAILY WOMENS 50+ PO), Take 1 tablet by mouth every morning. , Disp: , Rfl:    naproxen  (NAPROSYN ) 500 MG tablet, Take 1 tablet (500 mg total) by mouth 2 (two) times daily with a meal., Disp: 60 tablet, Rfl: 5   naproxen  (NAPROSYN ) 500 MG tablet, Take 1 tablet (500 mg total) by mouth 2 (two) times daily with a meal., Disp: 60 tablet, Rfl: 5   promethazine -dextromethorphan (PROMETHAZINE -DM) 6.25-15 MG/5ML syrup, Take 5 mLs by mouth 4 (four) times daily as needed for cough., Disp: 118 mL, Rfl: 0   spironolactone  (ALDACTONE ) 50 MG tablet, Take 1 tablet (50 mg total) by mouth daily., Disp: 90 tablet, Rfl: 1   Tetrahydrozoline HCl (VISINE OP), Place 1  drop into both eyes daily as needed (dry eye relief). , Disp: , Rfl:    valACYclovir  (VALTREX ) 500 MG tablet, TAKE 1 TABLET BY MOUTH TWICE DAILY AS NEEDED FOR 3 DAYS FOR FLARES, Disp: 6 tablet, Rfl: 0   vitamin B-12 (CYANOCOBALAMIN ) 1000 MCG tablet, Take 1,000 mcg by mouth once a week. , Disp: , Rfl:    Medications ordered in this encounter:  Meds ordered this encounter  Medications   albuterol  (VENTOLIN  HFA) 108 (90 Base) MCG/ACT inhaler    Sig: Inhale 2 puffs into the lungs every 6 (six) hours as needed for wheezing or shortness of breath.    Dispense:   17 g    Refill:  0    Supervising Provider:   BLAISE ALEENE KIDD [id number]   amoxicillin -clavulanate (AUGMENTIN ) 875-125 MG tablet    Sig: Take 1 tablet by mouth 2 (two) times daily.    Dispense:  14 tablet    Refill:  0    Supervising Provider:   BLAISE ALEENE KIDD [[id number]]   fluticasone -salmeterol (ADVAIR) 100-50 MCG/ACT AEPB    Sig: Inhale 1 puff into the lungs 2 (two) times daily.    Dispense:  60 each    Refill:  0    Supervising Provider:   BLAISE ALEENE KIDD [[id number]]     *If you need refills on other medications prior to your next appointment, please contact your pharmacy*  Follow-Up: Call back or seek an in-person evaluation if the symptoms worsen or if the condition fails to improve as anticipated.  [facility] [phone]  Other Instructions  Cough, Adult Coughing is a reflex that clears your throat and airways (respiratory system). It helps heal and protect your lungs. It is normal to cough from time to time. A cough that happens with other symptoms or that lasts a long time may be a sign of a condition that needs treatment. A short-term (acute) cough may only last 2-3 weeks. A long-term (chronic) cough may last 8 or more weeks. Coughing is often caused by: Diseases, such as: An infection of the respiratory system. Asthma or other heart or lung diseases. Gastroesophageal reflux. This is when acid comes back up from the stomach. Breathing in things that irritate your lungs. Allergies. Postnasal drip. This is when mucus runs down the back of your throat. Smoking. Some medicines. Follow these instructions at home: Medicines Take over-the-counter and prescription medicines only as told by your health care provider. Talk with your provider before you take cough medicine (cough suppressants). Eating and drinking Do not drink alcohol . Avoid caffeine. Drink enough fluid to keep your pee (urine) pale yellow. Lifestyle Avoid cigarette smoke. Do not use  any products that contain nicotine or tobacco. These products include cigarettes, chewing tobacco, and vaping devices, such as e-cigarettes. If you need help quitting, ask your provider. Avoid things that make you cough. These may include perfumes, candles, cleaning products, or campfire smoke. General instructions  Watch for any changes to your cough. Tell your provider about them. Always cover your mouth when you cough. If the air is dry in your bedroom or home, use a cool mist vaporizer or humidifier. If your cough is worse at night, try to sleep in a semi-upright position. Rest as needed. Contact a health care provider if: You have new symptoms, or your symptoms get worse. You cough up pus. You have a fever that does not go away or a cough that does not get better after 2-3 weeks. You  cannot control your cough with medicine, and you are losing sleep. You have pain that gets worse or is not helped with medicine. You lose weight for no clear reason. You have night sweats. Get help right away if: You cough up blood. You have trouble breathing. Your heart is beating very fast. These symptoms may be an emergency. Get help right away. Call 911. Do not wait to see if the symptoms will go away. Do not drive yourself to the hospital. This information is not intended to replace advice given to you by your health care provider. Make sure you discuss any questions you have with your health care provider. Document Revised: 12/03/2021 Document Reviewed: 12/03/2021 Elsevier Patient Education  2024 [employer]   If you have been instructed to have an in-person evaluation today at a local Urgent Care facility, please use the link below. It will take you to a list of all of our available [facility], including address, phone number and hours of operation. Please do not delay care.  [facility]  If you or a family member do not have a primary care provider, use the link  below to schedule a visit and establish care. When you choose a [facility] primary care physician or advanced practice provider, you gain a long-term partner in health. Find a Primary Care Provider  Learn more about [facility]'s in-office and virtual care options: [facility] Get Care Now

## 2024-01-23 ENCOUNTER — Encounter (INDEPENDENT_AMBULATORY_CARE_PROVIDER_SITE_OTHER): Payer: Self-pay | Admitting: Otolaryngology

## 2024-01-23 ENCOUNTER — Ambulatory Visit (INDEPENDENT_AMBULATORY_CARE_PROVIDER_SITE_OTHER): Admitting: Otolaryngology

## 2024-01-23 VITALS — BP 116/76 | HR 76 | Temp 97.7°F

## 2024-01-23 DIAGNOSIS — J342 Deviated nasal septum: Secondary | ICD-10-CM

## 2024-01-23 DIAGNOSIS — J343 Hypertrophy of nasal turbinates: Secondary | ICD-10-CM

## 2024-01-23 DIAGNOSIS — J019 Acute sinusitis, unspecified: Secondary | ICD-10-CM

## 2024-01-23 DIAGNOSIS — J309 Allergic rhinitis, unspecified: Secondary | ICD-10-CM | POA: Diagnosis not present

## 2024-01-23 DIAGNOSIS — R0981 Nasal congestion: Secondary | ICD-10-CM | POA: Diagnosis not present

## 2024-01-23 DIAGNOSIS — J310 Chronic rhinitis: Secondary | ICD-10-CM

## 2024-01-23 DIAGNOSIS — R053 Chronic cough: Secondary | ICD-10-CM

## 2024-01-23 DIAGNOSIS — R0982 Postnasal drip: Secondary | ICD-10-CM

## 2024-01-23 NOTE — Progress Notes (Signed)
 Patient ID: Bianca Arroyo, female   DOB: 23-Jul-1950, 73 y.o.   MRN: [id number]  Follow-up: Chronic nasal drainage, recurrent sinusitis  Discussed the use of AI scribe software for clinical note transcription with the patient, who gave verbal consent to proceed.  History of Present Illness Bianca Arroyo is a 73 year old female with chronic sinus issues who presents with persistent cough and sinus drainage.  She has experienced a persistent cough for the past three months, which worsens during allergy  season. She sought urgent care last week due to the severity of her symptoms.  She uses Atrovent  nasal spray twice daily and Flonase  daily to manage her symptoms. Despite these treatments, she was prescribed Augmentin  two days ago for a sinus infection after a previous course of Z-Pak was ineffective.  She reports sinus drainage, which has shown some improvement with the current treatment regimen.  She has a known history of a deviated septum and swollen turbinates.  No history of reflux or asthma. She was diagnosed with a new sinus infection a few days ago and started Augmentin  two days prior to this visit.    Exam: General: Communicates without difficulty, well nourished, no acute distress. Head: Normocephalic, no evidence injury, no tenderness, facial buttresses intact without stepoff. Face/sinus: No tenderness to palpation and percussion. Facial movement is normal and symmetric. Eyes: PERRL, EOMI. No scleral icterus, conjunctivae clear. Neuro: CN II exam reveals vision grossly intact.  No nystagmus at any point of gaze. Ears: Auricles well formed without lesions.  Ear canals are intact without mass or lesion.  No erythema or edema is appreciated.  The TMs are intact without fluid. Nose: External evaluation reveals normal support and skin without lesions.  Dorsum is intact.  Anterior rhinoscopy reveals congested mucosa over anterior aspect of inferior turbinates and deviated septum.  No  purulence noted. Oral:  Oral cavity and oropharynx are intact, symmetric, without erythema or edema.  Mucosa is moist without lesions. Neck: Full range of motion without pain.  There is no significant lymphadenopathy.  No masses palpable.  Thyroid  bed within normal limits to palpation.  Parotid glands and submandibular glands equal bilaterally without mass.  Trachea is midline. Neuro:  CN 2-12 grossly intact.   Assessment & Plan Chronic allergic rhinitis with nasal congestion, turbinate hypertrophy, and deviated septum Symptoms are exacerbated during allergy  season due to high pollen levels. Nasal congestion is expected given the anatomical deviations and current environmental conditions. - Continue Flonase  daily to control allergy  symptoms such as sneezing, itchy eyes, and itchy nose - Continue Atrovent  nasal spray as needed, up to twice daily, to reduce nasal drainage  Chronic postnasal drainage Chronic postnasal drainage is present but has improved. Atrovent  nasal spray is effective in reducing drainage. - Continue Atrovent  nasal spray as needed, up to twice daily, to reduce nasal drainage  Acute sinusitis Acute sinusitis diagnosed recently, currently being treated with Augmentin . Previous treatment with Z-Pak was ineffective. Symptoms include cough and sinus congestion. Augmentin  is expected to be effective in resolving the infection. - Complete course of Augmentin  - Contact provider if new infections or sinus issues arise

## 2024-02-08 ENCOUNTER — Other Ambulatory Visit: Payer: Self-pay | Admitting: Family Medicine

## 2024-02-19 ENCOUNTER — Encounter: Payer: Self-pay | Admitting: Radiology

## 2024-03-29 ENCOUNTER — Ambulatory Visit

## 2024-03-29 VITALS — BP 137/82 | Ht 62.0 in | Wt 130.0 lb

## 2024-03-29 DIAGNOSIS — Z0000 Encounter for general adult medical examination without abnormal findings: Secondary | ICD-10-CM

## 2024-03-29 DIAGNOSIS — E119 Type 2 diabetes mellitus without complications: Secondary | ICD-10-CM

## 2024-03-29 MED ORDER — BLOOD GLUCOSE TEST VI STRP: 1.0000 | ORAL_STRIP | Freq: Three times a day (TID) | 0 refills | Status: AC

## 2024-03-29 MED ORDER — BLOOD GLUCOSE MONITORING SUPPL DEVI: 1.0000 | Freq: Three times a day (TID) | 0 refills | Status: AC

## 2024-03-29 MED ORDER — LANCETS MISC: 1.0000 | 0 refills | Status: AC

## 2024-03-29 MED ORDER — LANCET DEVICE MISC: 1.0000 | Freq: Three times a day (TID) | 0 refills | Status: AC

## 2024-03-29 NOTE — Progress Notes (Signed)
 Appointment(s) made: (Tuesday April 02, 2024 for flu vaccine. Prescription sent in for blood glucose meter. Labs ordered. Last eye exam requested)  Chief Complaint  Patient presents with   Medicare Wellness     Subjective:   Bianca Arroyo is a 73 y.o. female who presents for a Medicare Annual Wellness Visit.  Visit info / Clinical Intake: Medicare Wellness Visit Type:: Subsequent Annual Wellness Visit Persons participating in visit and providing information:: patient Medicare Wellness Visit Mode:: Telephone If telephone:: video declined Since this visit was completed virtually, some vitals may be partially provided or unavailable. Missing vitals are due to the limitations of the virtual format.: Documented vitals are patient reported If Telephone or Video please confirm:: I connected with patient using audio/video enable telemedicine. I verified patient identity with two identifiers, discussed telehealth limitations, and patient agreed to proceed. Patient Location:: home Provider Location:: home office Interpreter Needed?: No Pre-visit prep was completed: yes AWV questionnaire completed by patient prior to visit?: yes Date:: 03/25/24 Living arrangements:: (!) (Patient-Rptd) lives alone Patient's Overall Health Status Rating: (!) (Patient-Rptd) fair Typical amount of pain: (Patient-Rptd) some Does pain affect daily life?: (Patient-Rptd) no Are you currently prescribed opioids?: no  Dietary Habits and Nutritional Risks How many meals a day?: (Patient-Rptd) 2 Eats fruit and vegetables daily?: (!) (Patient-Rptd) no Most meals are obtained by: (Patient-Rptd) preparing own meals; eating out In the last 2 weeks, have you had any of the following?: none Diabetic:: (!) yes Any non-healing wounds?: no How often do you check your BS?: 1 Would you like to be referred to a Nutritionist or for Diabetic Management? : no  Functional Status Activities of Daily Living (to include  ambulation/medication): (Patient-Rptd) Independent Ambulation: (Patient-Rptd) Independent Medication Administration: (Patient-Rptd) Independent Home Management (perform basic housework or laundry): (Patient-Rptd) Independent Manage your own finances?: (Patient-Rptd) yes Primary transportation is: (Patient-Rptd) driving Concerns about vision?: no *vision screening is required for WTM* Concerns about hearing?: no  Fall Screening Falls in the past year?: (Patient-Rptd) 0 Number of falls in past year: 0 Was there an injury with Fall?: 0 Fall Risk Category Calculator: 0 Patient Fall Risk Level: Low Fall Risk  Fall Risk Patient at Risk for Falls Due to: No Fall Risks Fall risk Follow up: Falls evaluation completed; Education provided; Falls prevention discussed  Home and Transportation Safety: All rugs have non-skid backing?: (Patient-Rptd) yes All stairs or steps have railings?: (Patient-Rptd) yes Grab bars in the bathtub or shower?: (!) (Patient-Rptd) no Have non-skid surface in bathtub or shower?: (Patient-Rptd) yes Good home lighting?: (Patient-Rptd) yes Regular seat belt use?: (Patient-Rptd) yes Hospital stays in the last year:: (Patient-Rptd) no  Cognitive Assessment Difficulty concentrating, remembering, or making decisions? : (Patient-Rptd) no Will 6CIT or Mini Cog be Completed: no 6CIT or Mini Cog Declined: patient alert, oriented, able to answer questions appropriately and recall recent events  Advance Directives (For Healthcare) Does Patient Have a Medical Advance Directive?: No Would patient like information on creating a medical advance directive?: No - Patient declined  Reviewed/Updated  Reviewed/Updated: Reviewed All (Medical, Surgical, Family, Medications, Allergies, Care Teams, Patient Goals)    Allergies (verified) Colchicine   Current Medications (verified) Outpatient Encounter Medications as of 03/29/2024  Medication Sig   albuterol  (VENTOLIN  HFA) 108  (90 Base) MCG/ACT inhaler Inhale 2 puffs into the lungs every 6 (six) hours as needed for wheezing or shortness of breath.   amLODipine  (NORVASC ) 5 MG tablet Take 1 tablet (5 mg total) by mouth daily.  amoxicillin -clavulanate (AUGMENTIN ) 875-125 MG tablet Take 1 tablet by mouth 2 (two) times daily.   blood glucose meter kit and supplies Dispense based on patient and insurance preference. Use to test glucose once daily.  (FOR ICD -10 E11.9)   Blood Glucose Monitoring Suppl DEVI 1 each by Does not apply route in the morning, at noon, and at bedtime. May substitute to any manufacturer covered by patient's insurance.   carvedilol  (COREG ) 25 MG tablet TAKE 1 TABLET BY MOUTH TWICE DAILY WITH A MEAL   ciclopirox  (PENLAC ) 8 % solution Apply topically at bedtime. Apply over nail and surrounding skin. Apply daily over previous coat. After seven (7) days, may remove with alcohol  and continue cycle.   fluticasone  (FLONASE ) 50 MCG/ACT nasal spray USE TWO SPRAY(S) IN EACH NOSTRIL ONCE DAILY   fluticasone -salmeterol (ADVAIR) 100-50 MCG/ACT AEPB Inhale 1 puff into the lungs 2 (two) times daily.   glipiZIDE  (GLUCOTROL ) 5 MG tablet TAKE 1 TABLET BY MOUTH ONCE DAILY WITH A MEAL   Glucose Blood (BLOOD GLUCOSE TEST STRIPS) STRP 1 each by In Vitro route in the morning, at noon, and at bedtime. May substitute to any manufacturer covered by patient's insurance.   ipratropium (ATROVENT ) 0.06 % nasal spray Place 2 sprays into both nostrils 2 (two) times daily as needed (nasal drainage).   Lancet Device MISC 1 each by Does not apply route in the morning, at noon, and at bedtime. May substitute to any manufacturer covered by patient's insurance.   Lancets MISC 1 each by Does not apply route as directed. Dispense based on patient and insurance preference. Use up to four times daily as directed. (FOR ICD-10 E10.9, E11.9).   losartan  (COZAAR ) 100 MG tablet Take 1 tablet (100 mg total) by mouth daily.   lovastatin  (MEVACOR ) 20 MG  tablet Take 1 tablet (20 mg total) by mouth daily.   metFORMIN  (GLUCOPHAGE ) 500 MG tablet Take 1 tablet (500 mg total) by mouth 2 (two) times daily with a meal. Take by mouth daily with breakfast.   montelukast  (SINGULAIR ) 10 MG tablet Take 1 tablet (10 mg total) by mouth at bedtime.   Multiple Vitamins-Minerals (ALIVE ONCE DAILY WOMENS 50+ PO) Take 1 tablet by mouth every morning.    naproxen  (NAPROSYN ) 500 MG tablet Take 1 tablet (500 mg total) by mouth 2 (two) times daily with a meal.   naproxen  (NAPROSYN ) 500 MG tablet Take 1 tablet (500 mg total) by mouth 2 (two) times daily with a meal.   promethazine -dextromethorphan (PROMETHAZINE -DM) 6.25-15 MG/5ML syrup Take 5 mLs by mouth 4 (four) times daily as needed for cough.   spironolactone  (ALDACTONE ) 50 MG tablet Take 1 tablet by mouth once daily   Tetrahydrozoline HCl (VISINE OP) Place 1 drop into both eyes daily as needed (dry eye relief).    valACYclovir  (VALTREX ) 500 MG tablet TAKE 1 TABLET BY MOUTH TWICE DAILY AS NEEDED FOR 3 DAYS FOR FLARES   vitamin B-12 (CYANOCOBALAMIN ) 1000 MCG tablet Take 1,000 mcg by mouth once a week.    No facility-administered encounter medications on file as of 03/29/2024.    History: Past Medical History:  Diagnosis Date   Allergy  1965   seasonal, colcrys (~2014)   Arthritis 1990   Asthma 2016   Cataract    H/O echocardiogram    a. 05/2017: echo showing EF of 65-70%, no regional WMA, moderate LVH, and mild MR.    Hyperlipidemia    Hypertension    Type 2 diabetes mellitus (HCC)  Past Surgical History:  Procedure Laterality Date   BREAST BIOPSY Left    benign   CESAREAN SECTION     COLONOSCOPY     COLONOSCOPY N/A 12/06/2013   Dr. Harvey: Left colon redundant, diverticulosis, 2 polyps removed, one tubular adenoma the other hyperplastic.  Consider colonoscopy in 5 to 10 years with overtube   COLONOSCOPY WITH PROPOFOL  N/A 06/11/2019   Procedure: COLONOSCOPY WITH PROPOFOL ;  Surgeon: Harvey Margo CROME,  MD;  Location: [facility];  Service: Endoscopy;  Laterality: N/A;  10:30am   EYE SURGERY  ~1995   [facility]   FRACTURE SURGERY  2003 and 2012   left wrist and right thumb   Left wrist surgery     POLYPECTOMY  06/11/2019   Procedure: POLYPECTOMY;  Surgeon: Harvey Margo CROME, MD;  Location: [facility];  Service: Endoscopy;;   Right trigger thumb  2013   tummy tuck     Family History  Problem Relation Age of Onset   Diabetes Father    Hypertension Father    Diabetes Mother    High blood pressure Mother    High blood pressure Sister    Diabetes Sister    Colon cancer Neg Hx    Social History   Occupational History   Not on file  Tobacco Use   Smoking status: Never   Smokeless tobacco: Never   Tobacco comments:    none  Vaping Use   Vaping status: Never Used  Substance and Sexual Activity   Alcohol  use: Yes    Alcohol /week: 4.0 standard drinks of alcohol     Types: 4 Shots of liquor per week    Comment: 3-4 times per week   Drug use: No   Sexual activity: Not Currently    Birth control/protection: None   Tobacco Counseling Counseling given: Yes Tobacco comments: none  SDOH Screenings   Food Insecurity: No Food Insecurity (03/25/2024)  Housing: Low Risk (03/25/2024)  Transportation Needs: No Transportation Needs (03/25/2024)  Utilities: Not At Risk (03/29/2024)  Alcohol  Screen: Medium Risk (03/25/2024)  Depression (PHQ2-9): Low Risk (03/29/2024)  Financial Resource Strain: Low Risk (03/25/2024)  Physical Activity: Insufficiently Active (03/25/2024)  Social Connections: Moderately Integrated (03/25/2024)  Stress: No Stress Concern Present (03/25/2024)  Tobacco Use: Low Risk (03/29/2024)  Health Literacy: Adequate Health Literacy (03/29/2024)   See flowsheets for full screening details  Depression Screen PHQ 2 & 9 Depression Scale- Over the past 2 weeks, how often have you been bothered by any of the following problems? Feeling down, depressed, or hopeless (PHQ  Adolescent also includes...irritable): 1 PHQ-2 Total Score: 1 Trouble falling or staying asleep, or sleeping too much: 0 Feeling tired or having little energy: 1 Poor appetite or overeating (PHQ Adolescent also includes...weight loss): 0 Feeling bad about yourself - or that you are a failure or have let yourself or your family down: 0 Trouble concentrating on things, such as reading the newspaper or watching television (PHQ Adolescent also includes...like school work): 0 Moving or speaking so slowly that other people could have noticed. Or the opposite - being so fidgety or restless that you have been moving around a lot more than usual: 0 Thoughts that you would be better off dead, or of hurting yourself in some way: 0 PHQ-9 Total Score: 2 If you checked off any problems, how difficult have these problems made it for you to do your work, take care of things at home, or get along with other people?: Not difficult at all  Depression Treatment Depression Interventions/Treatment : PHQ2-9 Score <4 Follow-up Not Indicated     Goals Addressed               This Visit's Progress     I want to visit my family more (pt-stated)               Objective:    Today's Vitals   03/29/24 1402  BP: 137/82  Weight: 130 lb (59 kg)  Height: 5' 2 (1.575 m)   Body mass index is 23.78 kg/m.  Hearing/Vision screen Hearing Screening - Comments:: Patient denies any hearing difficulties.   Vision Screening - Comments:: Wears rx glasses - up to date with routine eye exams with  Oneil Kawasaki  Immunizations and Health Maintenance Health Maintenance  Topic Date Due   Bone Density Scan  11/02/2018   OPHTHALMOLOGY EXAM  03/19/2023   HEMOGLOBIN A1C  05/14/2023   Medicare Annual Wellness (AWV)  07/08/2023   Diabetic kidney evaluation - eGFR measurement  10/03/2023   COVID-19 Vaccine (8 - 2025-26 season) 12/18/2023   Diabetic kidney evaluation - Urine ACR  04/20/2024   Influenza Vaccine   07/16/2024 (Originally 11/17/2023)   FOOT EXAM  10/09/2024   Mammogram  12/04/2024   DTaP/Tdap/Td (2 - Td or Tdap) 12/28/2025   Colonoscopy  06/10/2029   Pneumococcal Vaccine: 50+ Years  Completed   Hepatitis C Screening  Completed   Zoster Vaccines- Shingrix  Completed   Meningococcal B Vaccine  Aged Out   Hepatitis B Vaccines 19-59 Average Risk  Discontinued        Assessment/Plan:  This is a routine wellness examination for Bianca Arroyo.  Patient Care Team: Cook, Jayce G, DO as PCP - General (Family Medicine) Debera Jayson MATSU, MD as Consulting Physician (Cardiology) Kawasaki Oneil, DO (Optometry) Gershon Donnice SAUNDERS, DPM as Consulting Physician (Podiatry) Onesimo Oneil LABOR, MD as Consulting Physician (Orthopedic Surgery) Karis Clunes, MD as Consulting Physician (Otolaryngology) Leva Rush, MD as Consulting Physician (Obstetrics and Gynecology) Shona Rush, MD (Dermatology)  I have personally reviewed and noted the following in the patients chart:   Medical and social history Use of alcohol , tobacco or illicit drugs  Current medications and supplements including opioid prescriptions. Functional ability and status Nutritional status Physical activity Advanced directives List of other physicians Hospitalizations, surgeries, and ER visits in previous 12 months Vitals Screenings to include cognitive, depression, and falls Referrals and appointments  Orders Placed This Encounter  Procedures   Microalbumin / creatinine urine ratio   Hemoglobin A1c   CMP14+EGFR   In addition, I have reviewed and discussed with patient certain preventive protocols, quality metrics, and best practice recommendations. A written personalized care plan for preventive services as well as general preventive health recommendations were provided to patient.   Andersen Mckiver, CMA   03/29/2024   Return Thursday April 03, 2025 at 9:20 am, for your yearly Medicare Wellness Visit in person.  After Visit  Summary: (MyChart) Due to this being a telephonic visit, the after visit summary with patients personalized plan was offered to patient via MyChart

## 2024-03-29 NOTE — Patient Instructions (Signed)
 Ms. Shimmin,  Thank you for taking the time for your Medicare Wellness Visit. I appreciate your continued commitment to your health goals. Please review the care plan we discussed, and feel free to reach out if I can assist you further.  Please note that Annual Wellness Visits do not include a physical exam. Some assessments may be limited, especially if the visit was conducted virtually. If needed, we may recommend an in-person follow-up with your provider.  Ongoing Care Seeing your primary care provider every 3 to 6 months helps us  monitor your health and provide consistent, personalized care.   1 year follow up for Medicare well visit: Thursday April 03, 2025 at 9:20 am with medicare wellness nurse in office [facility] In Office on Tuesday April 02, 2024 at 9:30 am  Referrals If a referral was made during today's visit and you haven't received any updates within two weeks, please contact the referred provider directly to check on the status.  Lab Orders         Microalbumin / creatinine urine ratio         Hemoglobin A1c         CMP14+EGFR    Have these completed at [facility]  A prescription for a new blood glucose meter was sent to your local pharmacy. Pick this up when they call and say it's ready.   Recommended Screenings:  Health Maintenance  Topic Date Due   Osteoporosis screening with Bone Density Scan  11/02/2018   Eye exam for diabetics  03/19/2023   Hemoglobin A1C  05/14/2023   Medicare Annual Wellness Visit  07/08/2023   Yearly kidney function blood test for diabetes  10/03/2023   COVID-19 Vaccine (8 - 2025-26 season) 12/18/2023   Yearly kidney health urinalysis for diabetes  04/20/2024   Flu Shot  07/16/2024*   Complete foot exam   10/09/2024   Breast Cancer Screening  12/04/2024   DTaP/Tdap/Td vaccine (2 - Td or Tdap) 12/28/2025   Colon Cancer Screening  06/10/2029   Pneumococcal Vaccine for age over 88  Completed   Hepatitis C Screening  Completed    Zoster (Shingles) Vaccine  Completed   Meningitis B Vaccine  Aged Out   Hepatitis B Vaccine  Discontinued  *Topic was postponed. The date shown is not the original due date.       03/25/2024    9:51 AM  Advanced Directives  Does Patient Have a Medical Advance Directive? No  Would patient like information on creating a medical advance directive? No - Patient declined    Vision: Annual vision screenings are recommended for early detection of glaucoma, cataracts, and diabetic retinopathy. These exams can also reveal signs of chronic conditions such as diabetes and high blood pressure.  Dental: Annual dental screenings help detect early signs of oral cancer, gum disease, and other conditions linked to overall health, including heart disease and diabetes.  Please see the attached documents for additional preventive care recommendations.

## 2024-04-01 LAB — OPHTHALMOLOGY REPORT-SCANNED

## 2024-04-02 ENCOUNTER — Ambulatory Visit

## 2024-04-02 DIAGNOSIS — Z23 Encounter for immunization: Secondary | ICD-10-CM

## 2024-05-14 ENCOUNTER — Other Ambulatory Visit: Payer: Self-pay | Admitting: Family Medicine

## 2024-06-07 ENCOUNTER — Other Ambulatory Visit

## 2024-07-24 ENCOUNTER — Ambulatory Visit (INDEPENDENT_AMBULATORY_CARE_PROVIDER_SITE_OTHER): Admitting: Otolaryngology

## 2025-04-03 ENCOUNTER — Ambulatory Visit
